# Patient Record
Sex: Female | Born: 1935 | Race: White | Hispanic: No | Marital: Single | Smoking: Never smoker
Health system: Southern US, Community
[De-identification: ages and names within clinical notes are randomized; demographics above are authoritative.]

## PROBLEM LIST (undated history)

## (undated) DIAGNOSIS — I471 Supraventricular tachycardia, unspecified: Secondary | ICD-10-CM

## (undated) DIAGNOSIS — I10 Essential (primary) hypertension: Secondary | ICD-10-CM

## (undated) DIAGNOSIS — E785 Hyperlipidemia, unspecified: Secondary | ICD-10-CM

## (undated) DIAGNOSIS — D649 Anemia, unspecified: Secondary | ICD-10-CM

## (undated) DIAGNOSIS — M1990 Unspecified osteoarthritis, unspecified site: Secondary | ICD-10-CM

## (undated) DIAGNOSIS — K449 Diaphragmatic hernia without obstruction or gangrene: Secondary | ICD-10-CM

## (undated) DIAGNOSIS — R609 Edema, unspecified: Secondary | ICD-10-CM

## (undated) DIAGNOSIS — K219 Gastro-esophageal reflux disease without esophagitis: Secondary | ICD-10-CM

## (undated) DIAGNOSIS — E039 Hypothyroidism, unspecified: Secondary | ICD-10-CM

## (undated) DIAGNOSIS — K589 Irritable bowel syndrome without diarrhea: Secondary | ICD-10-CM

## (undated) DIAGNOSIS — Z9289 Personal history of other medical treatment: Secondary | ICD-10-CM

## (undated) DIAGNOSIS — I499 Cardiac arrhythmia, unspecified: Secondary | ICD-10-CM

## (undated) DIAGNOSIS — I2510 Atherosclerotic heart disease of native coronary artery without angina pectoris: Secondary | ICD-10-CM

## (undated) HISTORY — DX: Hyperlipidemia, unspecified: E78.5

## (undated) HISTORY — DX: Unspecified osteoarthritis, unspecified site: M19.90

## (undated) HISTORY — DX: Atherosclerotic heart disease of native coronary artery without angina pectoris: I25.10

## (undated) HISTORY — DX: Supraventricular tachycardia, unspecified: I47.10

## (undated) HISTORY — DX: Gastro-esophageal reflux disease without esophagitis: K21.9

## (undated) HISTORY — PX: BREAST SURGERY: SHX581

## (undated) HISTORY — DX: Diaphragmatic hernia without obstruction or gangrene: K44.9

## (undated) HISTORY — PX: CARDIAC CATHETERIZATION: SHX172

## (undated) HISTORY — DX: Irritable bowel syndrome, unspecified: K58.9

## (undated) HISTORY — PX: APPENDECTOMY: SHX54

## (undated) HISTORY — DX: Hypothyroidism, unspecified: E03.9

## (undated) HISTORY — DX: Supraventricular tachycardia: I47.1

## (undated) HISTORY — DX: Essential (primary) hypertension: I10

## (undated) HISTORY — DX: Edema, unspecified: R60.9

## (undated) HISTORY — PX: EYE SURGERY: SHX253

---

## 1997-08-14 ENCOUNTER — Ambulatory Visit (HOSPITAL_COMMUNITY): Admission: RE | Admit: 1997-08-14 | Discharge: 1997-08-14 | Payer: Self-pay | Admitting: Family Medicine

## 1998-02-27 DIAGNOSIS — I2510 Atherosclerotic heart disease of native coronary artery without angina pectoris: Secondary | ICD-10-CM

## 1998-02-27 HISTORY — PX: CORONARY ARTERY BYPASS GRAFT: SHX141

## 1998-02-27 HISTORY — DX: Atherosclerotic heart disease of native coronary artery without angina pectoris: I25.10

## 1998-06-17 ENCOUNTER — Inpatient Hospital Stay (HOSPITAL_COMMUNITY): Admission: AD | Admit: 1998-06-17 | Discharge: 1998-06-27 | Payer: Self-pay | Admitting: Internal Medicine

## 1998-06-20 ENCOUNTER — Encounter: Payer: Self-pay | Admitting: *Deleted

## 1998-06-24 ENCOUNTER — Encounter: Payer: Self-pay | Admitting: Thoracic Surgery (Cardiothoracic Vascular Surgery)

## 1998-12-20 ENCOUNTER — Other Ambulatory Visit: Admission: RE | Admit: 1998-12-20 | Discharge: 1998-12-20 | Payer: Self-pay | Admitting: Family Medicine

## 1999-01-25 ENCOUNTER — Encounter: Payer: Self-pay | Admitting: Family Medicine

## 1999-01-25 ENCOUNTER — Ambulatory Visit (HOSPITAL_COMMUNITY): Admission: RE | Admit: 1999-01-25 | Discharge: 1999-01-25 | Payer: Self-pay | Admitting: Family Medicine

## 1999-01-31 ENCOUNTER — Encounter: Payer: Self-pay | Admitting: Family Medicine

## 1999-01-31 ENCOUNTER — Ambulatory Visit (HOSPITAL_COMMUNITY): Admission: RE | Admit: 1999-01-31 | Discharge: 1999-01-31 | Payer: Self-pay | Admitting: Family Medicine

## 1999-11-03 ENCOUNTER — Other Ambulatory Visit: Admission: RE | Admit: 1999-11-03 | Discharge: 1999-11-03 | Payer: Self-pay | Admitting: *Deleted

## 2000-12-10 ENCOUNTER — Encounter: Payer: Self-pay | Admitting: Emergency Medicine

## 2000-12-10 ENCOUNTER — Inpatient Hospital Stay (HOSPITAL_COMMUNITY): Admission: EM | Admit: 2000-12-10 | Discharge: 2000-12-12 | Payer: Self-pay | Admitting: Emergency Medicine

## 2000-12-12 ENCOUNTER — Encounter: Payer: Self-pay | Admitting: Internal Medicine

## 2001-03-20 ENCOUNTER — Emergency Department (HOSPITAL_COMMUNITY): Admission: EM | Admit: 2001-03-20 | Discharge: 2001-03-20 | Payer: Self-pay | Admitting: Emergency Medicine

## 2001-03-20 ENCOUNTER — Encounter: Payer: Self-pay | Admitting: Emergency Medicine

## 2001-06-27 ENCOUNTER — Emergency Department (HOSPITAL_COMMUNITY): Admission: EM | Admit: 2001-06-27 | Discharge: 2001-06-27 | Payer: Self-pay | Admitting: Emergency Medicine

## 2001-06-27 ENCOUNTER — Encounter: Payer: Self-pay | Admitting: Emergency Medicine

## 2001-07-03 ENCOUNTER — Encounter: Admission: RE | Admit: 2001-07-03 | Discharge: 2001-07-03 | Payer: Self-pay | Admitting: Internal Medicine

## 2001-07-03 ENCOUNTER — Encounter (HOSPITAL_BASED_OUTPATIENT_CLINIC_OR_DEPARTMENT_OTHER): Payer: Self-pay | Admitting: Internal Medicine

## 2003-02-28 HISTORY — PX: CHOLECYSTECTOMY: SHX55

## 2003-05-01 ENCOUNTER — Inpatient Hospital Stay (HOSPITAL_COMMUNITY): Admission: EM | Admit: 2003-05-01 | Discharge: 2003-05-02 | Payer: Self-pay | Admitting: Emergency Medicine

## 2003-07-28 ENCOUNTER — Ambulatory Visit (HOSPITAL_COMMUNITY): Admission: RE | Admit: 2003-07-28 | Discharge: 2003-07-29 | Payer: Self-pay | Admitting: General Surgery

## 2003-07-28 ENCOUNTER — Encounter (INDEPENDENT_AMBULATORY_CARE_PROVIDER_SITE_OTHER): Payer: Self-pay | Admitting: Specialist

## 2003-09-27 ENCOUNTER — Emergency Department (HOSPITAL_COMMUNITY): Admission: EM | Admit: 2003-09-27 | Discharge: 2003-09-27 | Payer: Self-pay | Admitting: Emergency Medicine

## 2003-09-29 ENCOUNTER — Encounter: Admission: RE | Admit: 2003-09-29 | Discharge: 2003-09-29 | Payer: Self-pay | Admitting: General Surgery

## 2004-02-26 ENCOUNTER — Emergency Department (HOSPITAL_COMMUNITY): Admission: EM | Admit: 2004-02-26 | Discharge: 2004-02-26 | Payer: Self-pay

## 2004-03-07 ENCOUNTER — Ambulatory Visit: Payer: Self-pay | Admitting: Internal Medicine

## 2004-05-13 ENCOUNTER — Emergency Department (HOSPITAL_COMMUNITY): Admission: EM | Admit: 2004-05-13 | Discharge: 2004-05-13 | Payer: Self-pay | Admitting: Emergency Medicine

## 2004-06-14 ENCOUNTER — Ambulatory Visit: Payer: Self-pay | Admitting: Cardiology

## 2004-06-15 ENCOUNTER — Inpatient Hospital Stay (HOSPITAL_COMMUNITY): Admission: EM | Admit: 2004-06-15 | Discharge: 2004-06-16 | Payer: Self-pay | Admitting: Emergency Medicine

## 2004-06-30 ENCOUNTER — Ambulatory Visit: Payer: Self-pay | Admitting: Internal Medicine

## 2004-09-22 ENCOUNTER — Ambulatory Visit: Payer: Self-pay | Admitting: Cardiovascular Disease

## 2004-09-23 ENCOUNTER — Inpatient Hospital Stay (HOSPITAL_COMMUNITY): Admission: EM | Admit: 2004-09-23 | Discharge: 2004-09-24 | Payer: Self-pay | Admitting: Emergency Medicine

## 2004-09-30 ENCOUNTER — Ambulatory Visit: Payer: Self-pay

## 2004-10-05 ENCOUNTER — Ambulatory Visit: Payer: Self-pay | Admitting: Cardiology

## 2004-10-27 ENCOUNTER — Ambulatory Visit: Payer: Self-pay | Admitting: Gastroenterology

## 2004-11-01 ENCOUNTER — Ambulatory Visit: Payer: Self-pay | Admitting: Gastroenterology

## 2004-11-03 ENCOUNTER — Ambulatory Visit: Payer: Self-pay | Admitting: Internal Medicine

## 2004-11-10 ENCOUNTER — Ambulatory Visit: Payer: Self-pay | Admitting: Gastroenterology

## 2004-12-30 ENCOUNTER — Ambulatory Visit: Payer: Self-pay | Admitting: Gastroenterology

## 2005-05-05 ENCOUNTER — Ambulatory Visit: Payer: Self-pay | Admitting: Internal Medicine

## 2005-06-28 ENCOUNTER — Encounter: Payer: Self-pay | Admitting: Internal Medicine

## 2005-11-02 ENCOUNTER — Ambulatory Visit: Payer: Self-pay | Admitting: Internal Medicine

## 2006-06-14 ENCOUNTER — Ambulatory Visit: Payer: Self-pay | Admitting: Internal Medicine

## 2006-07-30 ENCOUNTER — Ambulatory Visit: Payer: Self-pay | Admitting: Internal Medicine

## 2006-12-13 ENCOUNTER — Ambulatory Visit: Payer: Self-pay | Admitting: Internal Medicine

## 2007-02-28 HISTORY — PX: REPLACEMENT TOTAL KNEE: SUR1224

## 2007-03-13 ENCOUNTER — Ambulatory Visit: Payer: Self-pay | Admitting: Cardiology

## 2007-03-13 ENCOUNTER — Inpatient Hospital Stay (HOSPITAL_COMMUNITY): Admission: RE | Admit: 2007-03-13 | Discharge: 2007-03-22 | Payer: Self-pay | Admitting: Orthopedic Surgery

## 2007-03-14 ENCOUNTER — Encounter (INDEPENDENT_AMBULATORY_CARE_PROVIDER_SITE_OTHER): Payer: Self-pay | Admitting: Orthopedic Surgery

## 2007-03-31 ENCOUNTER — Ambulatory Visit: Payer: Self-pay | Admitting: Cardiology

## 2007-03-31 ENCOUNTER — Inpatient Hospital Stay (HOSPITAL_COMMUNITY): Admission: EM | Admit: 2007-03-31 | Discharge: 2007-04-04 | Payer: Self-pay | Admitting: Emergency Medicine

## 2007-04-01 ENCOUNTER — Encounter: Payer: Self-pay | Admitting: Cardiology

## 2007-05-10 ENCOUNTER — Ambulatory Visit: Payer: Self-pay | Admitting: Internal Medicine

## 2007-08-05 ENCOUNTER — Ambulatory Visit: Payer: Self-pay | Admitting: Internal Medicine

## 2007-08-05 LAB — CONVERTED CEMR LAB
AST: 22 units/L (ref 0–37)
Alkaline Phosphatase: 69 units/L (ref 39–117)
Bilirubin, Direct: 0.2 mg/dL (ref 0.0–0.3)
Total Bilirubin: 1.1 mg/dL (ref 0.3–1.2)
Total CHOL/HDL Ratio: 2.7

## 2007-08-12 ENCOUNTER — Ambulatory Visit: Payer: Self-pay | Admitting: Internal Medicine

## 2007-09-05 ENCOUNTER — Emergency Department (HOSPITAL_COMMUNITY): Admission: EM | Admit: 2007-09-05 | Discharge: 2007-09-06 | Payer: Self-pay | Admitting: Emergency Medicine

## 2007-09-12 ENCOUNTER — Ambulatory Visit: Payer: Self-pay | Admitting: Internal Medicine

## 2007-09-13 ENCOUNTER — Ambulatory Visit: Payer: Self-pay | Admitting: Internal Medicine

## 2007-09-13 ENCOUNTER — Ambulatory Visit: Payer: Self-pay

## 2007-09-13 LAB — CONVERTED CEMR LAB: TSH: 0.11 microintl units/mL — ABNORMAL LOW (ref 0.35–5.50)

## 2007-09-20 ENCOUNTER — Ambulatory Visit: Payer: Self-pay

## 2007-09-20 ENCOUNTER — Encounter: Payer: Self-pay | Admitting: Internal Medicine

## 2007-10-16 ENCOUNTER — Ambulatory Visit: Payer: Self-pay | Admitting: Gastroenterology

## 2007-10-16 DIAGNOSIS — K219 Gastro-esophageal reflux disease without esophagitis: Secondary | ICD-10-CM

## 2007-11-11 ENCOUNTER — Ambulatory Visit: Payer: Self-pay | Admitting: Internal Medicine

## 2007-11-11 LAB — CONVERTED CEMR LAB
CO2: 32 meq/L (ref 19–32)
Chloride: 99 meq/L (ref 96–112)
Creatinine, Ser: 0.8 mg/dL (ref 0.4–1.2)
Pro B Natriuretic peptide (BNP): 81 pg/mL (ref 0.0–100.0)

## 2008-07-09 ENCOUNTER — Encounter: Payer: Self-pay | Admitting: Cardiovascular Disease

## 2008-07-09 ENCOUNTER — Encounter: Payer: Self-pay | Admitting: Internal Medicine

## 2008-08-28 ENCOUNTER — Ambulatory Visit: Payer: Self-pay | Admitting: Internal Medicine

## 2008-08-28 DIAGNOSIS — Z951 Presence of aortocoronary bypass graft: Secondary | ICD-10-CM

## 2008-08-28 DIAGNOSIS — I10 Essential (primary) hypertension: Secondary | ICD-10-CM

## 2008-08-28 DIAGNOSIS — E782 Mixed hyperlipidemia: Secondary | ICD-10-CM

## 2008-10-09 IMAGING — CR DG CHEST 1V PORT
1 series · 1 of 1 positions shown · non-contrast
Comparison: 03/31/2007

CLINICAL DATA: ER patient with chest pain.

PORTABLE CHEST - 1 VIEW

[AP]
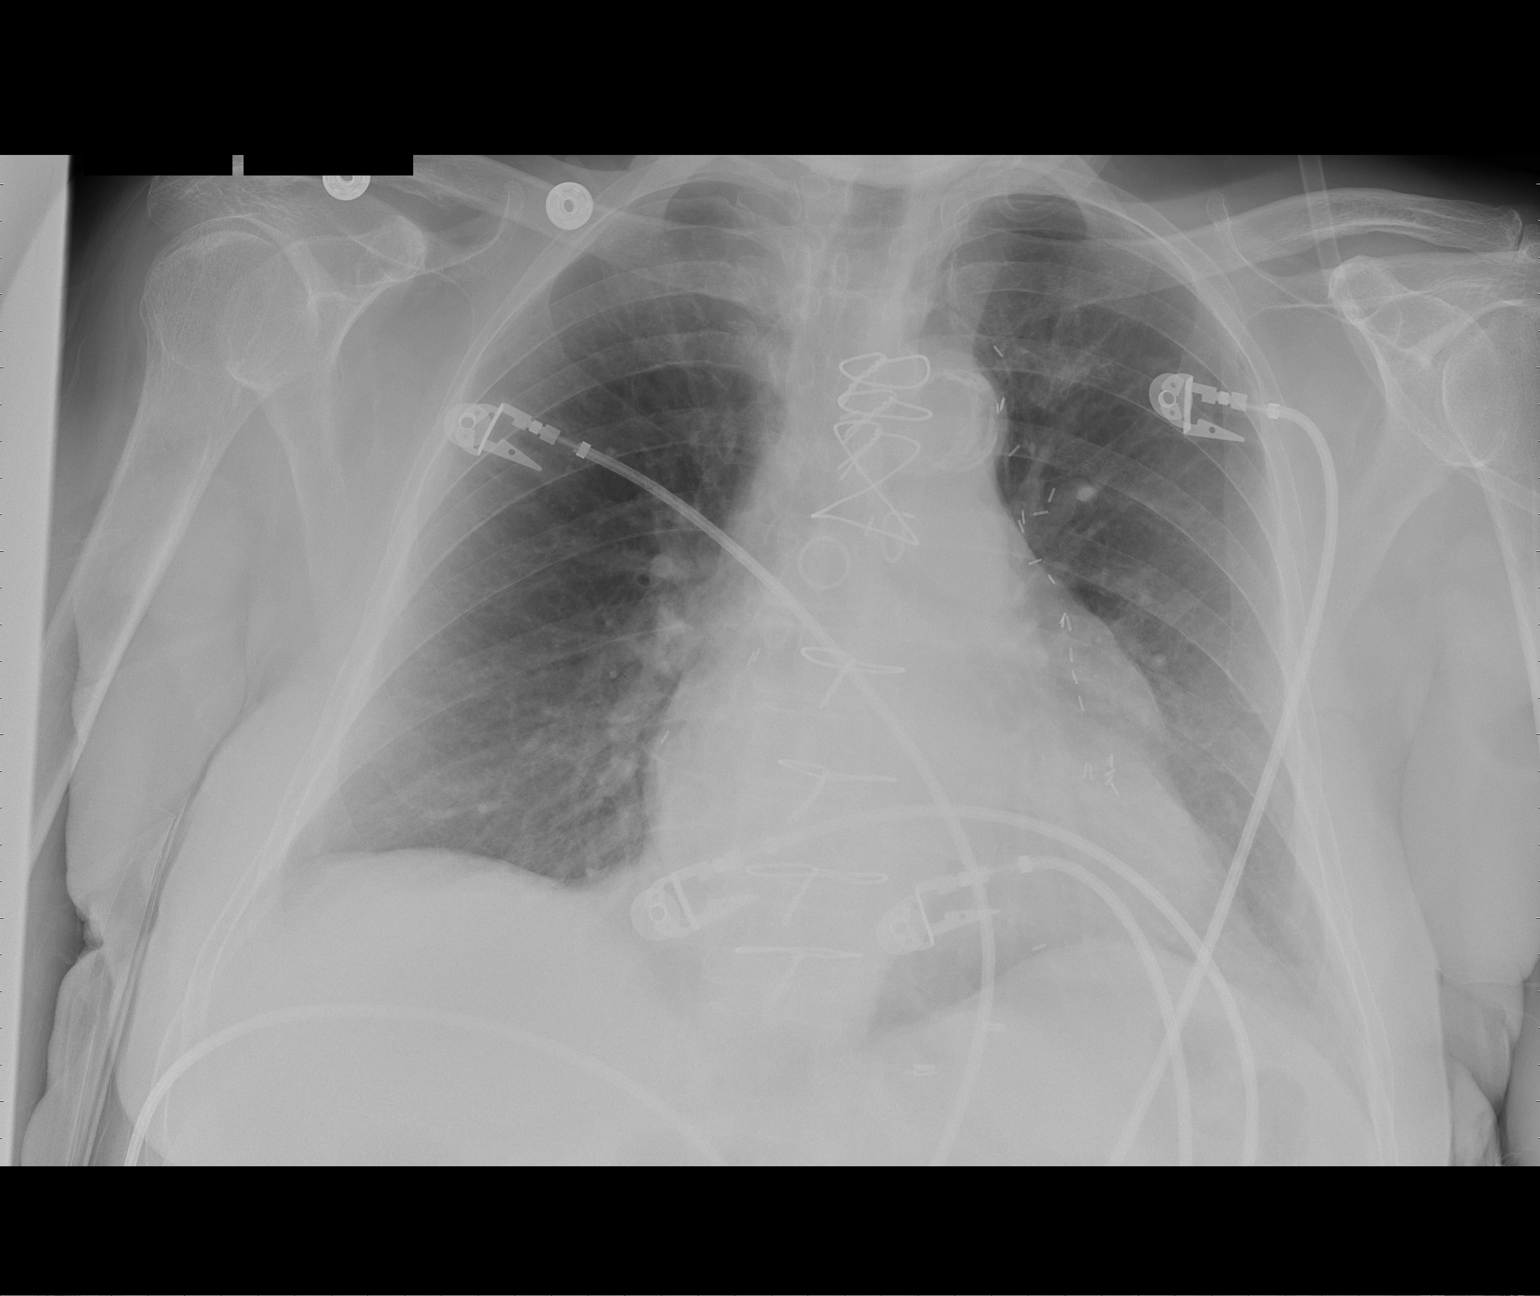

[1 of 1 positions shown; findings below may reference images not displayed]

FINDINGS: Cardiomegaly and pulmonary vascular congestion.  Minimal
perivascular edema is likely present.  Sternal wire sutures and
mediastinal clips.
IMPRESSION: Findings consistent with mild CHF.

## 2008-11-18 ENCOUNTER — Ambulatory Visit: Payer: Self-pay | Admitting: Cardiology

## 2008-11-18 ENCOUNTER — Inpatient Hospital Stay (HOSPITAL_COMMUNITY): Admission: EM | Admit: 2008-11-18 | Discharge: 2008-11-19 | Payer: Self-pay | Admitting: Emergency Medicine

## 2008-11-19 ENCOUNTER — Telehealth (INDEPENDENT_AMBULATORY_CARE_PROVIDER_SITE_OTHER): Payer: Self-pay | Admitting: *Deleted

## 2008-11-26 ENCOUNTER — Telehealth (INDEPENDENT_AMBULATORY_CARE_PROVIDER_SITE_OTHER): Payer: Self-pay | Admitting: *Deleted

## 2008-11-30 ENCOUNTER — Ambulatory Visit: Payer: Self-pay | Admitting: Cardiovascular Disease

## 2008-11-30 ENCOUNTER — Encounter: Payer: Self-pay | Admitting: Internal Medicine

## 2008-11-30 ENCOUNTER — Encounter (HOSPITAL_COMMUNITY): Admission: RE | Admit: 2008-11-30 | Discharge: 2009-01-29 | Payer: Self-pay | Admitting: Cardiovascular Disease

## 2008-11-30 ENCOUNTER — Ambulatory Visit: Payer: Self-pay

## 2008-12-11 ENCOUNTER — Ambulatory Visit: Payer: Self-pay | Admitting: Internal Medicine

## 2008-12-16 ENCOUNTER — Ambulatory Visit: Payer: Self-pay | Admitting: Internal Medicine

## 2008-12-16 DIAGNOSIS — E7800 Pure hypercholesterolemia, unspecified: Secondary | ICD-10-CM

## 2008-12-17 LAB — CONVERTED CEMR LAB
CO2: 29 meq/L (ref 19–32)
Calcium: 9.3 mg/dL (ref 8.4–10.5)
Glucose, Bld: 114 mg/dL — ABNORMAL HIGH (ref 70–99)
Potassium: 4.2 meq/L (ref 3.5–5.1)
Sodium: 134 meq/L — ABNORMAL LOW (ref 135–145)

## 2008-12-25 ENCOUNTER — Telehealth: Payer: Self-pay | Admitting: Internal Medicine

## 2008-12-31 ENCOUNTER — Ambulatory Visit: Payer: Self-pay | Admitting: Internal Medicine

## 2008-12-31 DIAGNOSIS — E876 Hypokalemia: Secondary | ICD-10-CM

## 2009-01-08 LAB — CONVERTED CEMR LAB
BUN: 4 mg/dL — ABNORMAL LOW (ref 6–23)
CO2: 30 meq/L (ref 19–32)
Chloride: 96 meq/L (ref 96–112)
Creatinine, Ser: 0.7 mg/dL (ref 0.4–1.2)

## 2009-06-22 ENCOUNTER — Encounter (INDEPENDENT_AMBULATORY_CARE_PROVIDER_SITE_OTHER): Payer: Self-pay | Admitting: *Deleted

## 2009-06-22 ENCOUNTER — Telehealth: Payer: Self-pay | Admitting: Gastroenterology

## 2009-06-24 ENCOUNTER — Ambulatory Visit: Payer: Self-pay | Admitting: Internal Medicine

## 2009-06-24 DIAGNOSIS — R05 Cough: Secondary | ICD-10-CM

## 2009-06-25 ENCOUNTER — Encounter: Payer: Self-pay | Admitting: Internal Medicine

## 2009-06-30 ENCOUNTER — Ambulatory Visit: Payer: Self-pay | Admitting: Internal Medicine

## 2009-07-02 LAB — CONVERTED CEMR LAB
Chloride: 102 meq/L (ref 96–112)
Creatinine, Ser: 0.7 mg/dL (ref 0.4–1.2)
GFR calc non Af Amer: 86.82 mL/min (ref >60)
Potassium: 4.1 meq/L (ref 3.5–5.1)

## 2009-07-27 ENCOUNTER — Encounter (INDEPENDENT_AMBULATORY_CARE_PROVIDER_SITE_OTHER): Payer: Self-pay | Admitting: *Deleted

## 2009-07-30 ENCOUNTER — Ambulatory Visit: Payer: Self-pay | Admitting: Gastroenterology

## 2009-08-03 ENCOUNTER — Ambulatory Visit: Payer: Self-pay | Admitting: Gastroenterology

## 2009-08-04 ENCOUNTER — Encounter: Payer: Self-pay | Admitting: Gastroenterology

## 2010-01-12 ENCOUNTER — Encounter: Payer: Self-pay | Admitting: Internal Medicine

## 2010-01-17 ENCOUNTER — Encounter: Payer: Self-pay | Admitting: Internal Medicine

## 2010-01-25 ENCOUNTER — Telehealth: Payer: Self-pay | Admitting: Internal Medicine

## 2010-02-25 ENCOUNTER — Ambulatory Visit: Payer: Self-pay | Admitting: Internal Medicine

## 2010-02-25 ENCOUNTER — Encounter: Payer: Self-pay | Admitting: Internal Medicine

## 2010-03-27 LAB — CONVERTED CEMR LAB
Basophils Absolute: 0.1 10*3/uL (ref 0.0–0.1)
CO2: 31 meq/L (ref 19–32)
Calcium: 9.2 mg/dL (ref 8.4–10.5)
Chloride: 87 meq/L — ABNORMAL LOW (ref 96–112)
Creatinine, Ser: 0.6 mg/dL (ref 0.4–1.2)
GFR calc non Af Amer: 74.42 mL/min (ref >60)
HCT: 40.8 % (ref 36.0–46.0)
Lymphs Abs: 2.3 10*3/uL (ref 0.7–4.0)
MCHC: 34.4 g/dL (ref 30.0–36.0)
MCV: 95.8 fL (ref 78.0–100.0)
Monocytes Absolute: 1.2 10*3/uL — ABNORMAL HIGH (ref 0.1–1.0)
Platelets: 279 10*3/uL (ref 150.0–400.0)
RDW: 13.4 % (ref 11.5–14.6)
Sodium: 130 meq/L — ABNORMAL LOW (ref 135–145)

## 2010-03-31 NOTE — Assessment & Plan Note (Signed)
Summary: rov/sl  Medications Added TYLENOL EXTRA STRENGTH 500 MG TABS (ACETAMINOPHEN) as needed TUMS 500 MG CHEW (CALCIUM CARBONATE ANTACID) as needed LIPITOR 80 MG TABS (ATORVASTATIN CALCIUM) take 1 tab every evening        Visit Type:  Follow-up Primary [name]:  Jarome Matin MD  CC:  No cardiac complaints.  History of Present Illness: patient is a 77 yer old with CAD. I last saw her in the spring.  She was admitted in the fall 2010 with CP.  Outpt myoview was normal. since I saw her she has done well from a cardiac standpoint.  No chest pains.  No SOB. She did have some problems with pain in her right leg, improving.  NOw only in the ankle  Current Medications (verified): 1)  Levothroid 75 Mcg Tabs (Levothyroxine Sodium) .Marland Kitchen.. 1 Tab Once Daily 2)  Amlodipine Besylate 10 Mg Tabs (Amlodipine Besylate) .... Take One Tablet By Mouth Daily 3)  Aspirin 81 Mg  Tabs (Aspirin) .... One Tablet By Mouth 1 Tab Qday 4)  Lisinopril 5 Mg  Tabs (Lisinopril) .... One Tablet By Mouth Once Daily 5)  Lipitor 80 Mg  Tabs (Atorvastatin Calcium) .... One Tablet By Mouth Once Daily-- 6)  Tylenol Extra Strength 500 Mg Tabs (Acetaminophen) .... As Needed 7)  Furosemide 20 Mg  Tabs (Furosemide) .Marland Kitchen.. 1 Once Daily 8)  Prilosec 20 Mg Cpdr (Omeprazole) .... Daily 9)  Tums 500 Mg Chew (Calcium Carbonate Antacid) .... As Needed 10)  Vitamin D .... 50,000 Units 1 Tab Q Weekly 11)  Klor-Con M20 20 Meq Cr-Tabs (Potassium Chloride Crys Cr) .Marland Kitchen.. 1 Tablet Every Day 12)  Nitrostat 0.4 Mg Subl (Nitroglycerin) .Marland Kitchen.. 1 Tablet Under Tongue At Onset of Chest Pain; You May Repeat Every 5 Minutes For Up To 3 Doses. 13)  Metoprolol Tartrate 50 Mg Tabs (Metoprolol Tartrate) .Marland Kitchen.. 1 Tab 2 Times Per Day  Allergies: 1)  ! Codeine  Past History:  Past medical, surgical, family and social histories (including risk factors) reviewed, and no changes noted (except as noted below).  Past Medical History: Reviewed history from  05/14/2008 and no changes required. Coronary Artery Disease Hypertension Hyperlipidemia SVT with ablation DJD Diabetes GERD Irritable Bowel Syndrome Hypothyroidism Hiatal Hernia Edema  Past Surgical History: Reviewed history from 10/16/2007 and no changes required. CABG-2000 L knee Replacement 2009 Cholecystectomy-2005 cardiac ablation Appendectomy  Family History: Reviewed history from 05/14/2008 and no changes required. Family History of Breast Cancer:sister No FH of Colon Cancer: Family History of Diabetes: sister brother Family History of Heart Disease: sister, father, mother  Mother died at age 24 possibly of an MI.  Father died   at 20 of an MI.  She has a brother who had a history of CAD and CABG,   and another brother who has a history of CAD and PCI, and a sister with   history of CAD and PCI.  Social History: Reviewed history from 05/14/2008 and no changes required. Occupation: retired Patient has never smoked.  Alcohol Use - no Illicit Drug Use - no Patient gets regular exercise. Single   Review of Systems       All systems reviewed. Neg to the above problem  Vital Signs:  Patient profile:   77 year old female Height:      64 inches Weight:      162.50 pounds BMI:     27.99 Pulse rate:   64 / minute Pulse rhythm:   regular Resp:  18 per minute BP sitting:   130 / 68  (left arm) Cuff size:   large  Vitals Entered By: Vikki Ports (February 25, 2010 9:12 AM)  Physical Exam  Additional Exam:  Patient is in NAD HEENT:  Normocephalic, atraumatic. EOMI, PERRLA.  Neck: JVP is normal. No thyromegaly. No bruits.  Lungs: clear to auscultation. No rales no wheezes.  Heart: Regular rate and rhythm. Normal S1, S2. No S3.   No significant murmurs. PMI not displaced.  Abdomen:  Supple, nontender. Normal bowel sounds. No masses. No hepatomegaly.  Extremities:   Good distal pulses throughout. No lower extremity edema.  Musculoskeletal :moving all  extremities.  Neuro:   alert and oriented x3.    EKG  Procedure date:  02/25/2010  Findings:      NSR.  First degree AV block.  Nonspecific ST T wave changes.  Impression & Recommendations:  Problem # 1:  CAD (ICD-414.00)  Her updated medication list for this problem includes:    Amlodipine Besylate 10 Mg Tabs (Amlodipine besylate) .Marland Kitchen... Take one tablet by mouth daily    Aspirin 81 Mg Tabs (Aspirin) ..... One tablet by mouth 1 tab qday    Lisinopril 5 Mg Tabs (Lisinopril) ..... One tablet by mouth once daily    Nitrostat 0.4 Mg Subl (Nitroglycerin) .Marland Kitchen... 1 tablet under tongue at onset of chest pain; you may repeat every 5 minutes for up to 3 doses.    Metoprolol Tartrate 50 Mg Tabs (Metoprolol tartrate) .Marland Kitchen... 1 tab 2 times per day  Her updated medication list for this problem includes:    Amlodipine Besylate 10 Mg Tabs (Amlodipine besylate) .Marland Kitchen... Take one tablet by mouth daily    Aspirin 81 Mg Tabs (Aspirin) ..... One tablet by mouth 1 tab qday    Lisinopril 5 Mg Tabs (Lisinopril) ..... One tablet by mouth once daily    Nitrostat 0.4 Mg Subl (Nitroglycerin) .Marland Kitchen... 1 tablet under tongue at onset of chest pain; you may repeat every 5 minutes for up to 3 doses.    Metoprolol Tartrate 50 Mg Tabs (Metoprolol tartrate) .Marland Kitchen... 1 tab 2 times per day  Problem # 2:  HYPERLIPIDEMIA-MIXED (ICD-272.4)  Will get fasting labs from Dr. Norval Gable office The following medications were removed from the medication list:    Lipitor 80 Mg Tabs (Atorvastatin calcium) ..... One tablet by mouth once daily--  The following medications were removed from the medication list:    Lipitor 80 Mg Tabs (Atorvastatin calcium) ..... One tablet by mouth once daily-- Her updated medication list for this problem includes:    Lipitor 80 Mg Tabs (Atorvastatin calcium) .Marland Kitchen... Take 1 tab every evening  Problem # 3:  HYPERTENSION, BENIGN (ICD-401.1)  Good control Her updated medication list for this problem  includes:    Amlodipine Besylate 10 Mg Tabs (Amlodipine besylate) .Marland Kitchen... Take one tablet by mouth daily    Aspirin 81 Mg Tabs (Aspirin) ..... One tablet by mouth 1 tab qday    Lisinopril 5 Mg Tabs (Lisinopril) ..... One tablet by mouth once daily    Furosemide 20 Mg Tabs (Furosemide) .Marland Kitchen... 1 once daily    Metoprolol Tartrate 50 Mg Tabs (Metoprolol tartrate) .Marland Kitchen... 1 tab 2 times per day  Her updated medication list for this problem includes:    Amlodipine Besylate 10 Mg Tabs (Amlodipine besylate) .Marland Kitchen... Take one tablet by mouth daily    Aspirin 81 Mg Tabs (Aspirin) ..... One tablet by mouth 1 tab qday    Lisinopril 5  Mg Tabs (Lisinopril) ..... One tablet by mouth once daily    Furosemide 20 Mg Tabs (Furosemide) .Marland Kitchen... 1 once daily    Metoprolol Tartrate 50 Mg Tabs (Metoprolol tartrate) .Marland Kitchen... 1 tab 2 times per day  Patient Instructions: 1)  Your physician recommends that you schedule a follow-up appointment in the fall with Dr. Tenny Craw 2)  Your physician recommends that you continue on your current medications as directed. Please refer to the Current Medication list given to you today. Prescriptions: LIPITOR 80 MG TABS (ATORVASTATIN CALCIUM) take 1 tab every evening  #90 x 3   Entered by:   Ledon Snare, RN   Authorized by:   Sherrill Raring, MD, [facility]   Signed by:   Ledon Snare, RN on 02/25/2010   Method used:   [facility] [phone][facility] (retail)       [address]       [place], [place]  [address]       Ph: [phone] or [phone]       Fax: [phone]   RxID:   [id number] KLOR-CON M20 20 MEQ CR-TABS (POTASSIUM CHLORIDE CRYS CR) 1 tablet every day  #90 x 3   Entered by:   Ledon Snare, RN   Authorized by:   Sherrill Raring, MD, [facility]   Signed by:   Ledon Snare, RN on 02/25/2010   Method used:   [facility] to        [facility] [phone]* (retail)       [address]       [place], [place]  [address]       Ph: [phone] or [phone]        Fax: [phone]   RxID:   [id number]

## 2010-03-31 NOTE — Letter (Signed)
Summary: [facility] Instructions  [facility]  [address] [place], [place] [address]   Phone: [phone]  Fax: [phone]       [name]    Apr 09, 1947    MRN: [id number]        Procedure Day /Date:  Tuesday 6/7     Arrival Time: 9:30 am      Procedure Time: 10:30 am     Location of Procedure:                    _x _  [facility] (4th Floor)                        PREPARATION FOR COLONOSCOPY WITH MOVIPREP   Starting 5 days prior to your procedure Thursday 6/2 do not eat nuts, seeds, popcorn, corn, beans, peas,  salads, or any raw vegetables.  Do not take any fiber supplements (e.g. Metamucil, Citrucel, and Benefiber).  THE DAY BEFORE YOUR PROCEDURE         DATE: Monday 6/6  1.  Drink clear liquids the entire day-NO SOLID FOOD  2.  Do not drink anything colored red or purple.  Avoid juices with pulp.  No orange juice.  3.  Drink at least 64 oz. (8 glasses) of fluid/clear liquids during the day to prevent dehydration and help the prep work efficiently.  CLEAR LIQUIDS INCLUDE: Water Jello Ice Popsicles Tea (sugar ok, no milk/cream) Powdered fruit flavored drinks Coffee (sugar ok, no milk/cream) Gatorade Juice: apple, white grape, white cranberry  Lemonade Clear bullion, consomm, broth Carbonated beverages (any kind) Strained chicken noodle soup Hard Candy                             4.  In the morning, mix first dose of MoviPrep solution:    Empty 1 Pouch A and 1 Pouch B into the disposable container    Add lukewarm drinking water to the top line of the container. Mix to dissolve    Refrigerate (mixed solution should be used within 24 hrs)  5.  Begin drinking the prep at 5:00 p.m. The MoviPrep container is divided by 4 marks.   Every 15 minutes drink the solution down to the next mark (approximately 8 oz) until the full liter is complete.   6.  Follow completed prep with 16 oz of clear liquid of your choice (Nothing red or  purple).  Continue to drink clear liquids until bedtime.  7.  Before going to bed, mix second dose of MoviPrep solution:    Empty 1 Pouch A and 1 Pouch B into the disposable container    Add lukewarm drinking water to the top line of the container. Mix to dissolve    Refrigerate  THE DAY OF YOUR PROCEDURE      DATE: Tuesday 6/7  Beginning at 5:30 a.m. (5 hours before procedure):         1. Every 15 minutes, drink the solution down to the next mark (approx 8 oz) until the full liter is complete.  2. Follow completed prep with 16 oz. of clear liquid of your choice.    3. You may drink clear liquids until 8:30 am (2 HOURS BEFORE PROCEDURE).   MEDICATION INSTRUCTIONS  Unless otherwise instructed, you should take regular prescription medications with a small sip of water   as early as possible the morning of  your procedure.   Additional medication instructions:  Hold Furosemide morning of procedure only.         OTHER INSTRUCTIONS  You will need a responsible adult at least 75 years of age to accompany you and drive you home.   This person must remain in the waiting room during your procedure.  Wear loose fitting clothing that is easily removed.  Leave jewelry and other valuables at home.  However, you may wish to bring a book to read or  an iPod/MP3 player to listen to music as you wait for your procedure to start.  Remove all body piercing jewelry and leave at home.  Total time from sign-in until discharge is approximately 2-3 hours.  You should go home directly after your procedure and rest.  You can resume normal activities the  day after your procedure.  The day of your procedure you should not:   Drive   Make legal decisions   Operate machinery   Drink alcohol   Return to work  You will receive specific instructions about eating, activities and medications before you leave.    The above instructions have been reviewed and explained to me by  Sherren Kerns RN  July 30, 2009 9:32 AM     I fully understand and can verbalize these instructions _____________________________ Date _________

## 2010-03-31 NOTE — Letter (Signed)
Summary: Patient Notice- Polyp Results  [facility]  [address] [place], [place] [address]   Phone: [phone]  Fax: [phone]        August 04, 2009 MRN: [id number]    [facility] [address] [place], [place]  [address]    Dear [name],  I am pleased to inform you that the colon polyp(s) removed during your recent colonoscopy was (were) found to be benign (no cancer detected) upon pathologic examination.  I recommend you have a repeat colonoscopy examination in 5 years to look for recurrent polyps, as having colon polyps increases your risk for having recurrent polyps or even colon cancer in the future.  Should you develop new or worsening symptoms of abdominal pain, bowel habit changes or bleeding from the rectum or bowels, please schedule an evaluation with either your primary care physician or with me.  Continue treatment plan as outlined the day of your exam.  Please call us if you are having persistent problems or have questions about your condition that have not been fully answered at this time.  Sincerely,  Meryl Dare MD [facility]  This letter has been electronically signed by your physician.  Appended Document: Patient Notice- Polyp Results letter mailed.

## 2010-03-31 NOTE — Procedures (Signed)
Summary: Colonoscopy  Patient: Pyper Olexa Note: All result statuses are Final unless otherwise noted.  Tests: (1) Colonoscopy (COL)   COL Colonoscopy           DONE     [facility]     [address]     [place], [place]  [address]           COLONOSCOPY PROCEDURE REPORT           PATIENT:  Sadiyah, Kangas  MR#:  [id number]     BIRTHDATE:  08-Oct-1932, 76 yrs. old  GENDER:  female     ENDOSCOPIST:  Judie Petit T. Russella Dar, MD, [facility]     PROCEDURE DATE:  08/03/2009     PROCEDURE:  Colonoscopy with biopsy     ASA CLASS:  Class II     INDICATIONS:  1) Routine Risk Screening     MEDICATIONS:   Fentanyl 50 mcg IV, Versed 5 mg IV     DESCRIPTION OF PROCEDURE:   After the risks benefits and     alternatives of the procedure were thoroughly explained, informed     consent was obtained.  Digital rectal exam was performed and     revealed no abnormalities.   The LB [id number] endoscope     was introduced through the anus and advanced to the cecum, which     was identified by both the appendix and ileocecal valve, without     limitations.  The quality of the prep was excellent, using     MoviPrep.  The instrument was then slowly withdrawn as the colon     was fully examined.     <<PROCEDUREIMAGES>>     FINDINGS:  Three polyps were found in the transverse colon. They     were 3 mm in size. The polyps were removed using cold biopsy     forceps.  A normal appearing cecum, ileocecal valve, and     appendiceal orifice were identified. The ascending, hepatic     flexure, splenic flexure, descending, sigmoid colon, and rectum     appeared unremarkable.  Retroflexed views in the rectum revealed     internal hemorrhoids, small.  The time to cecum =  2.5  minutes.     The scope was then withdrawn (time =  10  min) from the patient     and the procedure completed.     COMPLICATIONS:  None           ENDOSCOPIC IMPRESSION:     1) 3 mm, three polyps in the transverse colon     2) Internal  hemorrhoids           RECOMMENDATIONS:     1) Await pathology results     2) If the polyps removed today are adenomatous (pre-cancerous),     you will need a repeat colonoscopy in 5 years. Otherwise you     should continue to follow colorectal cancer screening guidelines     for "routine risk" patients with colonoscopy in 10 years.     Venita Lick. Russella Dar, MD, [facility]           CC: Jarome Matin, MD           n.     Rosalie DoctorVenita Lick. Charlee Squibb at 08/03/2009 11:17 AM           [place], [id number]  Note: An exclamation mark (!) indicates a result that was not dispersed into the  flowsheet. Document Creation Date: 08/03/2009 11:18 AM _______________________________________________________________________  (1) Order result status: Final Collection or observation date-time: 08/03/2009 11:14 Requested date-time:  Receipt date-time:  Reported date-time:  Referring Physician:   Ordering Physician: Claudette Head [phone]) Specimen Source:  Source: Launa Grill Order Number: [id number] Lab site:   Appended Document: Colonoscopy     Procedures Next Due Date:    Colonoscopy: 07/2014

## 2010-03-31 NOTE — Miscellaneous (Signed)
Summary: recall colon/Previsit/rm  Clinical Lists Changes  Medications: Added new medication of MOVIPREP 100 GM  SOLR (PEG-KCL-NACL-NASULF-NA ASC-C) As per prep instructions. - Signed Rx of MOVIPREP 100 GM  SOLR (PEG-KCL-NACL-NASULF-NA ASC-C) As per prep instructions.;  #1 x 0;  Signed;  Entered by: Sherren Kerns RN;  Authorized by: Meryl Dare MD [facility];  Method used: [facility] [phone]*, [address], [place], [place]  [address], Ph: [phone] or [phone], Fax: [phone] Observations: Added new observation of ALLERGY REV: Done (07/30/2009 8:31)    Prescriptions: MOVIPREP 100 GM  SOLR (PEG-KCL-NACL-NASULF-NA ASC-C) As per prep instructions.  #1 x 0   Entered by:   Sherren Kerns RN   Authorized by:   Meryl Dare MD [facility]   Signed by:   Sherren Kerns RN on 07/30/2009   Method used:   [facility]        [facility] [phone]* (retail)       [address]       [place], [place]  [address]       Ph: [phone] or [phone]       Fax: [phone]   RxID:   [id number]

## 2010-03-31 NOTE — Progress Notes (Signed)
Summary: pt has medication question  Medications Added METOPROLOL TARTRATE 50 MG TABS (METOPROLOL TARTRATE) 1 tab 2 times per day       Phone Note Call from Patient Call back at Home Phone [phone]   Caller: Patient Reason for Call: Talk to Nurse, Talk to Doctor Summary of Call: pt has a medication question she thinks she has the wrong medication and wants to discuss this asap Initial call taken by: Omer Jack,  January 25, 2010 9:39 AM  Follow-up for Phone Call        Called patient back. She wants to stay on metoprolol tartrate 50mg   two times per day because that is what she has been taking for a long time. I advised her that [facility]. [facility] [address] and advised them to fill Metoprolol Tartrate 50mg  two times per day #180 with 3 refills.  Layne Benton, RN, BSN  January 25, 2010 10:29 AM     New/Updated Medications: METOPROLOL TARTRATE 50 MG TABS (METOPROLOL TARTRATE) 1 tab 2 times per day

## 2010-03-31 NOTE — Miscellaneous (Signed)
  Clinical Lists Changes  Medications: Removed medication of HYDROCHLOROTHIAZIDE 25 MG  TABS (HYDROCHLOROTHIAZIDE) one tablet by mouth once daily

## 2010-03-31 NOTE — Letter (Signed)
Summary: Previsit letter  [facility]  [address] [place], [place] [address]   Phone: [phone]  Fax: [phone]       06/22/2009 MRN: [id number]  [name] [address] [name], [place]  [address]  Dear [name],  Welcome to the Gastroenterology Division at [facility].    You are scheduled to see a nurse for your pre-procedure visit on 07/30/09 at 9:00am on the 3rd floor at [facility], [address]  We ask that you try to arrive at our office 15 minutes prior to your appointment time to allow for check-in.  Your nurse visit will consist of discussing your medical and surgical history, your immediate family medical history, and your medications.    Please bring a complete list of all your medications or, if you prefer, bring the medication bottles and we will list them.  We will need to be aware of both prescribed and over the counter drugs.  We will need to know exact dosage information as well.  If you are on blood thinners (Coumadin, Plavix, Aggrenox, Ticlid, etc.) please call our office today/prior to your appointment, as we need to consult with your physician about holding your medication.   Please be prepared to read and sign documents such as consent forms, a financial agreement, and acknowledgement forms.  If necessary, and with your consent, a friend or relative is welcome to sit-in on the nurse visit with you.  Please bring your insurance card so that we may make a copy of it.  If your insurance requires a referral to see a specialist, please bring your referral form from your primary care physician.  No co-pay is required for this nurse visit.     If you cannot keep your appointment, please call [phone] to cancel or reschedule prior to your appointment date.  This allows [place] the opportunity to schedule an appointment for another patient in need of care.    Thank you for choosing [facility] for your medical  needs.  We appreciate the opportunity to care for you.  Please visit [place] at our website  to learn more about our practice.                     Sincerely.                                                                                                                   The Gastroenterology Division

## 2010-03-31 NOTE — Progress Notes (Signed)
Summary: Schedule recall colon   Phone Note Outgoing Call Call back at [facility] Phone [phone]   Call placed by: Christie Nottingham CMA Duncan Dull),  June 22, 2009 2:49 PM Call placed to: Patient Summary of Call: Called pt to schedule recall colonoscopy. Pt scheduled for 08/03/09 and nurse visit on 07/30/09. Pt verbalized understanding.  Initial call taken by: Christie Nottingham CMA Duncan Dull),  June 22, 2009 2:53 PM

## 2010-03-31 NOTE — Assessment & Plan Note (Signed)
Summary: 6 month rov/sl  Medications Added LIPITOR 80 MG  TABS (ATORVASTATIN CALCIUM) one tablet by mouth once daily-- * TYLENOL 500MG   TABS (ACETAMINOPHEN) as needed NITROSTAT 0.4 MG SUBL (NITROGLYCERIN) 1 tablet under tongue at onset of chest pain; you may repeat every 5 minutes for up to 3 doses.      Allergies Added:   Visit Type:  Follow-up Primary Provider:  Jarome Matin MD  CC:  fatigue and sob.  History of Present Illness: patient is a 77 year old woman with coronary artery disease. I last saw her in clinic in the fall.Marland Kitchen She was admitted to [facility] prior to that with chest pain, shortness of breath. In September.An outpatient Myoview was done which was normal.   SInce seen, she says about 3 wks ago she caught a cold.  Had feverish feelings then, not now.  COmplains of a lot of coughing productive of clear sputum.  Still bothered by it.  Notes some wheezing.  No PND.  CHronic 2 pillow orthopnea.  Feels tired, sluggish.  Current Medications (verified): 1)  Levothroid 75 Mcg Tabs (Levothyroxine Sodium) .Marland Kitchen.. 1 Tab Once Daily 2)  Amlodipine Besylate 10 Mg Tabs (Amlodipine Besylate) .... Take One Tablet By Mouth Daily 3)  Aspirin 81 Mg  Tabs (Aspirin) .... One Tablet By Mouth 1 Tab Qday 4)  Metoprolol Succinate 50 Mg  Xr24h-Tab (Metoprolol Succinate) .... One Tablet By Mouth Two Times A Day 5)  Lisinopril 5 Mg  Tabs (Lisinopril) .... One Tablet By Mouth Once Daily 6)  Hydrochlorothiazide 25 Mg  Tabs (Hydrochlorothiazide) .... One Tablet By Mouth Once Daily 7)  Lipitor 80 Mg  Tabs (Atorvastatin Calcium) .... One Tablet By Mouth Once Daily-- 8)  Tylenol 500mg   Tabs (Acetaminophen) .... As Needed 9)  Furosemide 20 Mg  Tabs (Furosemide) .Marland Kitchen.. 1 Once Daily 10)  Levothroid 100 Mcg Tabs (Levothyroxine Sodium) .Marland Kitchen.. 1 Tab Once Daily 11)  Prilosec 20 Mg Cpdr (Omeprazole) .... Daily 12)  Tums .... As Needed 13)  Vitamin D .... 50,000 Units 1 Tab Q Weekly 14)  Klor-Con M20 20 Meq  Cr-Tabs (Potassium Chloride Crys Cr) .Marland Kitchen.. 1 Tablet Every Day 15)  Nitrostat 0.4 Mg Subl (Nitroglycerin) .Marland Kitchen.. 1 Tablet Under Tongue At Onset of Chest Pain; You May Repeat Every 5 Minutes For Up To 3 Doses.  Allergies (verified): 1)  ! Codeine  Past History:  Past medical, surgical, family and social histories (including risk factors) reviewed, and no changes noted (except as noted below).  Past Medical History: Reviewed history from 05/14/2008 and no changes required. Coronary Artery Disease Hypertension Hyperlipidemia SVT with ablation DJD Diabetes GERD Irritable Bowel Syndrome Hypothyroidism Hiatal Hernia Edema  Past Surgical History: Reviewed history from 10/16/2007 and no changes required. CABG-2000 L knee Replacement 2009 Cholecystectomy-2005 cardiac ablation Appendectomy  Family History: Reviewed history from 05/14/2008 and no changes required. Family History of Breast Cancer:sister No FH of Colon Cancer: Family History of Diabetes: sister brother Family History of Heart Disease: sister, father, mother  Mother died at age 35 possibly of an MI.  Father died   at 50 of an MI.  She has a brother who had a history of CAD and CABG,   and another brother who has a history of CAD and PCI, and a sister with   history of CAD and PCI.  Social History: Reviewed history from 05/14/2008 and no changes required. Occupation: retired Patient has never smoked.  Alcohol Use - no Illicit Drug Use - no Patient  gets regular exercise. Single   Review of Systems       All systmes reviewed.  Negatvie to the above  Vital Signs:  Patient profile:   77 year old female Height:      64 inches Weight:      157 pounds BMI:     27.05 Pulse rate:   70 / minute BP sitting:   125 / 75  (left arm) Cuff size:   regular  Vitals Entered By: Burnett Kanaris, CNA (June 24, 2009 11:38 AM)  Physical Exam  Additional Exam:  Patient is in NAD HEENT:  Normocephalic, atraumatic. EOMI,  PERRLA.  Neck: JVP is normal. No thyromegaly. No bruits.  Lungs:Rhonchi in L lower lobe.  Rare wheeze.  No rales. Heart: Regular rate and rhythm. Normal S1, S2. No S3.   No significant murmurs. PMI not displaced.  Abdomen:  Supple, nontender. Normal bowel sounds. No masses. No hepatomegaly.  Extremities:   Good distal pulses throughout. No lower extremity edema.  Musculoskeletal :moving all extremities.  Neuro:   alert and oriented x3.    EKG  Procedure date:  06/24/2009  Findings:      NSR.  68 bpm.  1st degree AV block.  Sl sagging of ST sevgments V3-V6, II.  Impression & Recommendations:  Problem # 1:  COUGH (ICD-786.2) CXR today shows no definite infilt.  Will have radiology review.  I think patient is slowly recovering from viral URI.  Check labs.  Problem # 2:  HYPERLIPIDEMIA-MIXED (ICD-272.4) Continue meds. Her updated medication list for this problem includes:    Lipitor 80 Mg Tabs (Atorvastatin calcium) ..... One tablet by mouth once daily--  Problem # 3:  CAD (ICD-414.00) I do not think the patient has evidence of active ischemia.  Probably recovering from URI.  Keep on current regimen.  If does not get better by June consider possible eval.  Patient will call if this is the case.  Other Orders: EKG w/ Interpretation (93000) TLB-CBC Platelet - w/Differential (85025-CBCD) TLB-BMP (Basic Metabolic Panel-BMET) (80048-METABOL) T-2 View CXR (71020TC)  Patient Instructions: 1)  Call if symptoms do not get better in next few wks.  Otherwise, if do, f/u in Winter.

## 2010-06-03 LAB — COMPREHENSIVE METABOLIC PANEL
ALT: 14 U/L (ref 0–35)
AST: 28 U/L (ref 0–37)
Alkaline Phosphatase: 58 U/L (ref 39–117)
CO2: 25 mEq/L (ref 19–32)
Chloride: 90 mEq/L — ABNORMAL LOW (ref 96–112)
Creatinine, Ser: 0.8 mg/dL (ref 0.4–1.2)
GFR calc Af Amer: >60 mL/min (ref >60)
GFR calc non Af Amer: >60 mL/min (ref >60)
Potassium: 3.4 mEq/L — ABNORMAL LOW (ref 3.5–5.1)
Total Bilirubin: 1.3 mg/dL — ABNORMAL HIGH (ref 0.3–1.2)

## 2010-06-03 LAB — CK TOTAL AND CKMB (NOT AT ARMC)
CK, MB: 1.5 ng/mL (ref 0.3–4.0)
CK, MB: 1.7 ng/mL (ref 0.3–4.0)
Relative Index: 1.5 (ref 0.0–2.5)
Total CK: 84 U/L (ref 7–177)

## 2010-06-03 LAB — TROPONIN I
Troponin I: 0.01 ng/mL (ref 0.00–0.06)
Troponin I: 0.03 ng/mL (ref 0.00–0.06)

## 2010-06-03 LAB — CBC
HCT: 36.8 % (ref 36.0–46.0)
HCT: 40.7 % (ref 36.0–46.0)
Hemoglobin: 12.8 g/dL (ref 12.0–15.0)
MCHC: 34.7 g/dL (ref 30.0–36.0)
MCV: 95 fL (ref 78.0–100.0)
MCV: 95.1 fL (ref 78.0–100.0)
RBC: 4.28 MIL/uL (ref 3.87–5.11)
RDW: 13.3 % (ref 11.5–15.5)
WBC: 7.4 10*3/uL (ref 4.0–10.5)

## 2010-06-03 LAB — BASIC METABOLIC PANEL
BUN: 7 mg/dL (ref 6–23)
CO2: 29 mEq/L (ref 19–32)
Calcium: 8.9 mg/dL (ref 8.4–10.5)
GFR calc non Af Amer: >60 mL/min (ref >60)
Glucose, Bld: 129 mg/dL — ABNORMAL HIGH (ref 70–99)
Sodium: 128 mEq/L — ABNORMAL LOW (ref 135–145)

## 2010-06-03 LAB — URINALYSIS, ROUTINE W REFLEX MICROSCOPIC
Bilirubin Urine: NEGATIVE
Glucose, UA: NEGATIVE mg/dL
Hgb urine dipstick: NEGATIVE
Ketones, ur: NEGATIVE mg/dL
Specific Gravity, Urine: 1.006 (ref 1.005–1.030)
pH: 7 (ref 5.0–8.0)

## 2010-06-03 LAB — DIFFERENTIAL
Basophils Absolute: 0.1 10*3/uL (ref 0.0–0.1)
Basophils Relative: 1 % (ref 0–1)
Eosinophils Absolute: 0 10*3/uL (ref 0.0–0.7)
Eosinophils Relative: 0 % (ref 0–5)
Lymphocytes Relative: 16 % (ref 12–46)
Monocytes Absolute: 0.8 10*3/uL (ref 0.1–1.0)

## 2010-06-03 LAB — GLUCOSE, CAPILLARY
Glucose-Capillary: 121 mg/dL — ABNORMAL HIGH (ref 70–99)
Glucose-Capillary: 158 mg/dL — ABNORMAL HIGH (ref 70–99)

## 2010-06-03 LAB — PROTIME-INR: INR: 1 (ref 0.00–1.49)

## 2010-06-03 LAB — POCT CARDIAC MARKERS

## 2010-07-12 NOTE — Consult Note (Signed)
Aimee [name], Aimee [name]                ACCOUNT NO.:  [id number]   MEDICAL RECORD NO.:  [id number]          PATIENT TYPE:  INP   LOCATION:  5017                         FACILITY:  [facility]   PHYSICIAN:  Aimee Sidle, MD DATE OF BIRTH:  08-19-1932   DATE OF CONSULTATION:  03/14/2007  DATE OF DISCHARGE:                                 CONSULTATION   PRIMARY CARDIOLOGIST:  Dr. Dietrich [name].  PRIMARY CARE Aimee [name]:  Dr.  Ivery [name]   REQUESTING PHYSICIAN:  Dr. Nadara Mustard, MD.   PATIENT PROFILE:  77 year old Caucasian female with prior history of CAD  and CABG, who was admitted for left total knee arthroplasty and  postoperatively is feeling restless with an abnormal ECG.   PROBLEM LIST:  1. Coronary artery disease.      [name].     Status post CABG x3 in 2000 with [name] LIMA to the LAD, vein       graft to the obtuse marginal, vein graft to the distal RCA.      b.     Cardiac catheterization, July of 2006, native multivessel       disease with three of three patent grafts.  There was an 80%       stenosis distal to the vein graft to the RCA anastomosis.       Ejection fraction 60%.      c.     July 2006 negative adenosine Myoview.  The patient was       medically managed.  2. Hypertension.  3. Hyperlipidemia.  4. Diabetes mellitus.  5. Gastroesophageal reflux disease.  6. Hypothyroidism.  7. Osteoarthritis/degenerative joint disease.      [name].     Status post left total knee arthroplasty, March 13, 2007.   HISTORY OF PRESENT ILLNESS:  [name] 77 year old Caucasian female with [name]  history of CAD status post CABG in 2000, and her last cardiac  catheterization was in July of 2006 revealing patent grafts with an 80%  stenosis distal to the vein graft to the RCA anastomosis.  She  subsequently underwent adenosine Myoview stress testing which was  negative for ischemia, and she has been medically managed.  On January  12 she noted some mild reproducible left sternal chest aching with  radiation to the scapulae and shoulders lasting [name] few hours and  resolving spontaneously.  She has had this type of discomfort more or  less since her bypass surgery and believes it is musculoskeletal in  nature as she can push on her chest and make it occur.  Most importantly  that type of discomfort is not similar to previous angina which is more  of [name] sharp, shooting pain from the right side of her chest across to the  left and associated with numbness of her arms.  She was admitted to Dr.  Audrie Lia service yesterday, January 14, for [name] left total knee arthroplasty  and postoperatively has been feeling jittery and just not right.  She is also somewhat restless.  She has not had any chest pain or  shortness of breath.  She did  have some nausea and vomiting yesterday.  Because of not feeling right, an ECG was obtained this morning which  showed ST depression, T-wave inversion in anterolateral leads which was  more pronounced than what was noted on ECG March 28, 2022.  She is awaiting  transfer to telemetry.   ALLERGIES:  Codeine.   CURRENT MEDICATIONS:  1. Morphine PCA.  2. Protonix 40 mg b.i.d.  3. HCTZ 25 mg daily.  4. Lisinopril 40 mg daily.  5. Lipitor 80 mg at bedtime.  6. Actos 15 units daily.  7. Norvasc 10 mg daily.  8. Lopressor 75 mg b.i.d. (last dose was yesterday morning).  9. Aspirin 81 mg daily.  10.Coumadin per pharmacy.  11.Colace 100 mg b.i.d.  12.Ferrous sulfate 325 mg t.i.d.  13.Synthroid 112 mcg daily.  14.Ancef 1 gram IV q.6 hours.   FAMILY HISTORY:  Mother died at 55, possibly of an MI.  Father died at  50 of an MI.  She has [name] brother who had [name] history of CAD and CABG and  another brother who has history of CAD and PCI and [name] sister with [name]  history of CAD and PCI.   SOCIAL HISTORY:  Patient lives in [place] by herself.  She lives in [name]  senior apartment complex.  She is retired from the [employer].  She denies any tobacco, alcohol or drug use.   She is not  routinely exercising.   REVIEW OF SYSTEMS:  Positive for shakes and feeling jittery, as well  as just not right as outlined in the HPI.  She is restless.  She did  have some chest discomfort on Monday which she describes as being  musculoskeletal in nature.  She has [name] postoperative left knee pain and  also chronic arthralgias.  She did have nausea and vomiting yesterday.  She does have [name] history of diabetes.  Otherwise, all systems reviewed  and negative.   PHYSICAL EXAMINATION:  VITAL SIGNS:  On physical exam temperature 97.7,  heart rate 88, respirations 18, blood pressure 104/61, pulse oximetry  95% on two liters.  GENERAL:  Pleasant white female in no acute distress, awake, alert and  oriented times three.  NECK:  No bruits, no JVD.  LUNGS:  Respirations are unlabored.  CARDIAC:  Regular S1, S2, no S3, no S4 or murmurs.  ABDOMEN:  Benign, soft, nontender.  SKIN:  Skin is warm and dry, pink.  EXTREMITIES: No clubbing, cyanosis or edema.  Dorsalis pedis and  posterior tibial pulses are 2+ and equal bilaterally.   DATA:  Chest x-ray not applicable or not done.  EKG shows sinus rhythm  at [name] rate of 97 beats per minute, normal axis.  She has less than 1-mm  ST-segment depression and flattening or downsloping depression and  flattening along with T-wave inversion in 1, AVL and V1 through V6.  This is more pronounced since her ECG on Mar 28, 2022.  Lab work:  Hemoglobin 9.2, hematocrit 26.7, WBC 12,600, platelets 155,000.  Sodium  131, potassium 3.9, chloride 97, CO2 28, BUN 14, creatinine 1.06,  glucose 176.  Calcium 8.2, INR 1.  Cardiac markers are pending.   ASSESSMENT AND PLAN:  1. Abnormal electrocardiogram/coronary artery disease. The patient did      have some chest discomfort on January 12, describes this as more      musculoskeletal in nature and somewhat chronic since her bypass      surgery.  Her electrocardiogram is now with more pronounced  ST      depression,  T-wave inversion compared to her preoperative ECG.  She      denies any chest pain or shortness of breath at this time but does      appear restless.  Her heart rate is up slightly.  Upon review she      has not received [name] beta blocker since her preoperative dose.  She      is due for [name] dose this morning.  We will plan to transfer her to      telemetry and cycle cardiac markers.  She will continue aspirin,      statin, beta blocker, ACE inhibitor and with [name] slight reduction in      hemoglobin and hematocrit to 9.2 and 26.7, will transfuse with one      unit of packed red blood cells as she does have known 80% stenosis      in the distal right coronary artery and could be experiencing some      demand ischemia.  If, from [name] clinical standpoint, she does not      improve or remains tachycardia and has [name] low threshold, will check      [name] CT of chest to rule out pulmonary embolism in this postoperative      setting.  If okay with Orthopedics, would recommend usage of      prophylactic DVT dose of Lovenox.  2. Hypertension, stable.  3. Hyperlipidemia.  Continue statin therapy.  4. Diabetes mellitus.  Continue home medications/sliding-scale      insulin.  5. Hypothyroidism.  Synthroid has been resumed.  6. Status post left total knee arthroplasty per Orthopedics.      Nicolasa Ducking, ANP      Aimee Sidle, MD  Electronically Signed    CB/MEDQ  D:  03/14/2007  T:  03/14/2007  Job:  [id number]

## 2010-07-12 NOTE — Assessment & Plan Note (Signed)
[facility]                            CARDIOLOGY OFFICE NOTE   NAME:[name], [name]                       MRN:          [id number]  DATE:12/13/2006                            DOB:          10-11-1932    IDENTIFICATION:  [name] is a 77 year old woman with coronary artery  disease, hypertension, dyslipidemia.  She was last seen in April.   In the interval, she has been doing okay.  She is working on moving now  and has been active with this.  She notes some tightness in her chest on  days she has been doing a lot.  She does not think it is necessarily  worsening artery issues.  She has been able to keep up her level of  activity.   She notes occasional dizziness, but not with any change in position.  It  can occur on and off.   No change in her breathing.   She has noted some increased reflux recently.  She is on Prilosec b.i.d.   Still having ankle and knee problems.  Needs to schedule a return for  [name].   CURRENT MEDICATIONS:  1. Vitamin D occasionally.  2. Lisinopril 40.  3. Actos 15.  4. Norvasc 10.  5. Prilosec b.i.d.  6. Tylenol.  7. Aspirin 81 b.i.d.  8. Synthroid 112 mcg.  9. Lipitor 80.  10.Hydrochlorothiazide 25.  11.Metoprolol 75 b.i.d.   PHYSICAL EXAM:  The patient is currently in no distress.  Blood pressure is 135/77, pulse 62 and regular, weight 165.  LUNGS:  Clear.  CARDIAC:  Regular rate and rhythm.  S1, S2.  No S3.  No murmurs.  ABDOMEN:  Benign.  Obese.  EXTREMITIES:  Lipedema noted.  Left ankle more swollen than the right.   IMPRESSION:  1. Coronary artery disease.  Last catheterization in July 2006 with      100% left anterior descending, 75% diagonal, 100% circumflex, 99%      obtuse marginal, 80% posterolateral segment artery.  The left      internal mammary artery to the left anterior descending was patent;      saphenous vein graft to obtuse marginal patent; saphenous vein      graft to distal right  coronary artery had an 80% lesion after the      anastomosis.  Myoview was negative for ischemia.  The patient has      been active.  She has intermittent chest discomfort.  Some may be      reflux.  I have given her a prescription for Protonix b.i.d. to      try.  If indeed things improve, she should follow up in      gastroenterology.  Otherwise, I am not convinced with her increased      activity that this is any worse than the angina.  If her symptoms      worse, though, she should call.  2. Hypertension.  Adequate control.  Would continue.  3. Dyslipidemia.  Lipid panel back in the spring looks good.  LDL 75,  HDL 57.  Would follow.  4. Degenerative joint disease.  Followed with [name].   I will set followup for the spring.  Sooner if problems develop.     Pricilla Riffle, MD, [facility]  Electronically Signed    PVR/MedQ  DD: 12/13/2006  DT: 12/14/2006  Job #: [id number]

## 2010-07-12 NOTE — Assessment & Plan Note (Signed)
[facility]                            CARDIOLOGY OFFICE NOTE   NAME:[name], [name]                       MRN:          [id number]  DATE:09/12/2007                            DOB:          February 17, 1933    IDENTIFICATION:  [name] is a 77 year old woman who I just saw in  clinic back on June 15.  She has a history of CAD, hypertension, and  dyslipidemia.  She is status post CABG in 2000.  Her last  catheterization in 2006 showed patent grafts with an 80% distal vein  graft lesion to Aimee RCA.  Myoview was negative for ischemia.   When I saw her last, she seemed to be doing fairly well.  I increased  her lisinopril because her blood pressure was running high.   On September 06, 2007, she developed chest pressure across her whole chest  with shortness of breath.  She went actually to Aimee emergency room, was  treated with IV Lasix, and sent home on p.o. Lasix.  I told she had some  congestive heart failure though her BMP was normal.  She has since been  seen in Internal Medicine Clinic and her Actos was discontinued.   She has had complaints of some mid chest pain that seems to be  associated with food intake, food may be getting stuck some.  She also  notes continued reflux.  She has not had another spell like Aimee night  that she went into Aimee emergency room.   CURRENT MEDICINES:  1. Synthroid 125 mcg.  2. Norvasc 5.  3. Aspirin 81 b.i.d.  4. Metoprolol 50 b.i.d.  5. Lisinopril 5.  6. Lasix 20.  7. Hydrochlorothiazide 25.  8. Lipitor 80.  9. Tylenol p.r.n.  10.Prilosec b.i.d.  11.Actos has been discontinued.   PHYSICAL EXAMINATION:  GENERAL:  [name] is in no distress at rest  though she is somewhat anxious talking about everything.  VITAL SIGNS:  Blood pressure 159/77; pulse is 72; and weight 160, which  is down 2 pounds from June 15.  LUNGS:  Clear.  NECK:  JVP is normal.  CARDIAC:  Regular rate and rhythm.  S1 and S2.  No S3.  No  significant  murmurs.  ABDOMEN:  Benign.  No hepatomegaly.  EXTREMITIES:  No significant edema.   A 12-lead EKG shows normal sinus rhythm, 69 beats per minute.  First-  degree AV block.  Nonspecific ST-T wave changes.   IMPRESSION:  Chest pressure, shortness of breath concerning.  I think  there are 2 types of discomfort; one is Aimee band-like that she had on  September 06, 2007, and Aimee other is discrete mid substernal chest pain.  Aimee  first pain is more worrisome, especially in Aimee setting of fluid  buildup.  For now, I would like to schedule Myoview scan, she is about 9  years post bypass to see if there is any inducible ischemia and we will  plan for Aimee morning.   In regard to Aimee other type of pain which seems to be more GI, but again  we will need to be evaluated with a Myoview first.  I would like to have  [name] set up to be seen by Dr. Russella Dar, who she has seen in Aimee  past.   I would continue her current medicines for now, increase her Norvasc for  her hypertension.   Note, we will need to see if she has had a thyroid checked recently,  would be important to reevaluate, her lipids back in June were  excellent.     Pricilla Riffle, MD, [facility]  Electronically Signed    PVR/MedQ  DD: 09/12/2007  DT: 09/13/2007  Job #: [id number]   cc:   Barry Dienes. Eloise Harman, M.D.

## 2010-07-12 NOTE — Assessment & Plan Note (Signed)
[facility]                            CARDIOLOGY OFFICE NOTE   NAME:[name], Aimee EDICK                       MRN:          [id number]  DATE:11/11/2007                            DOB:          [birth year]    IDENTIFICATION:  [name] is a 77 year old woman that follow in  clinic.  I last saw her in July.  She has a history of CAD (status post  CABG in 2000), hypertension, and dyslipidemia.   When I saw her last, she had had an episode of chest pressure that was  actually a couple varieties, one was concerning.   Because of this, I set her up for a Myoview scan.  She had this done on  September 13, 2007, which showed normal perfusion with an LVEF of 76%.   The patient also had an echocardiogram done on September 20, 2007, that  initially read out as an LVEF of 45% with some hypokinesis in the  inferior, posterior, and septal wall.  I have reviewed this and I think  overall the LVEF is normal with some septal, basal, and posterior  hypokinesis.  She has mild mitral regurgitation as well.   Since seen, she actually is doing pretty well.  No chest pain.  No  shortness of breath.  She is doing some walking.   She is taking hydrochlorothiazide and Lasix.   Current medicines include Norvasc 5, metoprolol 50 b.i.d., lisinopril 5,  furosemide 20, Synthroid 100, Nexium 40, aspirin 81, hydrochlorothiazide  25, and Lipitor 80.   PHYSICAL EXAMINATION:  GENERAL:  The patient is in no distress.  VITAL SIGNS:  Blood pressure 145/79, pulse is 70 and regular, weight 158  down 2 pounds from previous.  LUNGS:  Clear.  CARDIAC:  Regular rate and rhythm.  S1 and S2.  No S3.  No murmurs.  ABDOMEN:  Benign.  EXTREMITIES:  No edema.   IMPRESSION:  1. Coronary artery disease.  Myoview as noted above normal.  We would      continue on medical therapy.  2. Dyslipidemia, excellent control in lipids back in June with an LDL      of 70, HDL of 53, continue.  3. Edema.  She did  have some edema.  Actos was stopped.  I would check      her electrolytes on furosemide and hydrochlorothiazide, consider      switching her just to furosemide.   Otherwise, I will set followup for the winter.  I will be in touch with  her regarding the blood work.  BMET and BNP today.     Pricilla Riffle, MD, [facility]  Electronically Signed    PVR/MedQ  DD: 11/11/2007  DT: 11/12/2007  Job #: [id number]   cc:   Barry Dienes. Eloise Harman, M.D.

## 2010-07-12 NOTE — Op Note (Signed)
NAMESTEPHENIE, [name]                ACCOUNT NO.:  [id number]   MEDICAL RECORD NO.:  [id number]          PATIENT TYPE:  INP   LOCATION:  2550                         FACILITY:  [facility]   PHYSICIAN:  Nadara Mustard, MD     DATE OF BIRTH:  Jan 18, 1933   DATE OF PROCEDURE:  DATE OF DISCHARGE:                               OPERATIVE REPORT   PREOPERATIVE DIAGNOSIS:  Osteoarthritis left knee.   POSTOPERATIVE DIAGNOSIS:  Osteoarthritis left knee.   PROCEDURE:  Left total knee arthroplasty with DePuy rotating platform.   COMPONENTS:  A #3 tibia, #3 femur, 10-mm poly tray posterior stabilized  with a 35-mm patella.   SURGEON:  Nadara Mustard, MD   ANESTHESIA:  General plus femoral block.   ESTIMATED BLOOD LOSS:  Minimal.   ANTIBIOTICS:  Kefzol 1 gram.   DRAINS:  None.   COMPLICATIONS:  None.   TOURNIQUET TIME:  Approximately 39 minutes at 300 mmHg.   DISPOSITION:  To PACU in stable condition.   INDICATIONS FOR PROCEDURE:  The patient is a 77 year old woman with  osteoarthritis of the left knee.  She has failed conservative care.  She  has pain with activities of daily living and presents at this time for  total knee arthroplasty.  Risks and benefits were discussed including  infection, neurovascular injury, persistent pain, need for additional  surgery as well as DVT.  The patient states she understands and wished  to proceed at this time.   DESCRIPTION OF PROCEDURE:  The patient was brought to OR room 10 after  undergoing a femoral block.  The patient, then underwent a general  anesthetic.  After adequate levels of anesthesia were obtained, the  patient's left lower extremity was prepped using DuraPrep, draped in a  sterile field and Ioban was used to cover all exposed skin.  A midline  incision was made.  This was carried down to a medial parapatellar  retinacular incision.  The patella was everted.  The drill was used to  enter the canal.  The IM guide was used for the femur.   This was set for  5 degrees of valgus and 11 mm was taken off the distal femur.  This was  sized for a 3 and the chamfer cuts were made for a size 3 femur.   Attention was then focused on the tibia.  The external alignment guide  was used for the tibia.  This took 10 mm off the tibia with neutral  varus and valgus and no posterior slope.  This was set for a size 3 with  a size 3 covering the tibial plateau.  This was pegged and the keel  punch was placed.   Attention was then focused on the femur and the box cut was used to make  a box cut for a size 3 femur.  Trial components were placed and a 10-mm  poly posterior stabilized.  The patient had full flexion and extension  with stable varus and valgus stress.  The trial components were removed.  The patella was then resurfaced with 10 mm taken  off the patella.  This  was sized for a 35 in the peg cuts were made for the size 35 patella.   The knee was irrigated with pulse lavage.  The tibia and then femoral  components were inserted and loose cement was removed.  These were  impacted.  The knee was again irrigated with pulse lavage and the tibial  tray was placed.  The knee was kept in extension while the cement  hardened and the patellar component was placed with the clamp placed on  the patella.  This was kept in place until the cement hardened.  All  loose cement was removed.  The knee was again irrigated with pulse  lavage.  The tourniquet was deflated and hemostasis was obtained.  After  the cement had hardened, the was placed through a full range of motion.  There was no subluxation of the patella.  A lateral release was not  performed.  The posterior capsule of the knee was injected with a total  of 30 mL of 0.50% Marcaine plain.  The retinaculum was closed using #1  Vicryl.  Subcutaneous was closed using Vicryl.  Skin was closed using  approximating staples.  The wound was covered with Adaptic orthopedic  sponges, sterile Webril  and a Coban dressing.  The patient was then  extubated and taken to the PACU in stable condition.  Plan for  ambulation starting tomorrow.      Nadara Mustard, MD  Electronically Signed     MVD/MEDQ  D:  03/13/2007  T:  03/13/2007  Job:  [id number]

## 2010-07-12 NOTE — Discharge Summary (Signed)
[name], [name]                ACCOUNT NO.:  [id number]   MEDICAL RECORD NO.:  [id number]          PATIENT TYPE:  INP   LOCATION:  3706                         FACILITY:  [facility]   PHYSICIAN:  Nadara Mustard, MD     DATE OF BIRTH:  [birth year]   DATE OF ADMISSION:  03/13/2007  DATE OF DISCHARGE:  03/22/2007                               DISCHARGE SUMMARY   ADMISSION DIAGNOSIS:  1. Osteoarthritis, left knee.  2. Coronary artery disease, status post coronary artery bypass graft,      2000.  Cardiac catheterization, July 2006, with multivessel disease      and three patent grafts.  Ejection fraction 60%.  3. Hypertension.  4. Hyperlipidemia.  5. Diabetes mellitus.  6. Gastroesophageal reflux disease.  7. Hypothyroidism.   DISCHARGE DIAGNOSES:  1. Osteoarthritis, left knee.  2. Postoperative post-hemorrhagic anemia, requiring blood transfusion.  3. Postoperative tachycardia and chest pain with echocardiogram      without evidence of ischemic event.  4. Pulmonary embolism ruled out with chest CT.  5. Postoperative hypokalemia, resolved.  6. Postoperative hyponatremia, resolved.  7. Postoperative supraventricular tachycardia, resolved at discharge.   PROCEDURE:  On March 13, 2007, patient underwent left total knee  arthroplasty with DePuy rotating platform, #3 tibial component, #3  femoral component and 10 mm poly tray posterior stabilized with 35 mm  patella.  This was performed by Dr. Lajoyce Corners under general anesthesia plus  femoral block, left.   CONSULTATIONS:  Cardiology, Dr. Diona Browner and other associates from  [facility].   BRIEF HISTORY:  Patient is a 77 year old female with chronic and  progressive osteoarthritis of the left knee.  She has failed  conservative treatment.  Patient has pain with activities of daily  living.  Risks and benefits of surgical intervention were discussed and  she wished to proceed with a left total knee arthroplasty.   BRIEF HOSPITAL  COURSE:  The patient tolerated the procedure under  general anesthesia.  Postoperatively, she developed symptoms of mild  diffuse chest pain, stating she felt jittery and not right.  She was  restless.  Electrocardiogram was obtained, showing ST depression and T-  wave inversions in anterolateral leads, which was more pronounced than  the previous EKG for comparison.  Cardiac consult was obtained and  patient was seen and sent to the  telemetry unit for further  observation.  Patient was placed on Lovenox.  Cardiac enzymes were drawn  and deemed to show no ischemic event.  Patient was evaluated for  possible pulmonary embolism and chest CT was negative.  Patient had  episodes of tachycardia, which were treated with Cardizem.  Eventually  she did stabilize from a cardiac standpoint and was deemed stable for  discharge home.  Echocardiogram on January 15 showed 60% ejection  fraction, small pericardial effusion.  Patient did have known history of  coronary artery disease with findings as stated above in admission  diagnosis.  Patient developed postoperative anemia, requiring blood  transfusion.  She received one unit of packed red blood cells during the  hospital stay.  Hemoglobin and hematocrit  were stable at discharge.  The  lowest value of hemoglobin was 7.8 on March 16, 2007.  After  transfusion, hemoglobin returned to 9.1 with hematocrit 26.3.  Patient  with hypokalemia, treated with supplementation orally and IV.  Hyponatremia stabilized with adjustments in IV fluid.   From an orthopedic standpoint, she was started on the usual program for  ambulation and gait training, weightbearing as tolerated on the  operative extremity.  She was treated with CPM machine for passive range  of motion.  Physical therapy did see the patient daily and she was able  to ambulate only short distances.  Prior to discharge, she did ambulate  65 feet.  She required assistance with this.  It was felt she  would  benefit from further rehabilitation.  Patient agreed to short-term  nursing home placement for rehab and a bed was found available at  [facility] nursing facility.  Patient continued to use the knee  immobilizer for ambulatory purposes during the hospital stay.  The knee  immobilizer was removed when at rest or in the CPM period.  She was  taught active range of motion to the knee.  At discharge, CPM was a 0-50  degrees.   Patient was on Lovenox during the hospital stay.  She will be changed to  Coumadin at discharge 1 mg daily.  Dressing changes were done daily and  the patient's wound was healing well.  Two small fracture blisters,  distal to the wound, healing without signs of infection.  At discharge,  patient was afebrile with vital signs stable.  She was voiding without  difficulty.  She was taking a regular diet at discharge.   OTHER PERTINENT LABORATORY VALUES:  Chest x-ray on January 15 with mild  bibasilar atelectasis with tiny amount of fluid, cardiomegaly without  edema.  Patient was treated with incentive spirometry during the  hospital stay.  Postoperative x-ray of the left knee showed satisfactory  position, left total knee replacement.  Hemoglobin and hematocrit on  March 20, 2007 were 9.1 and 26.3.  INR at discharge 0.9.  Chemistry  studies on January 22 with sodium 131, potassium 3.5, chloride 91, CO2  32, glucose 106, remaining values normal.  Iron studies on January 22  showed iron 45, total iron binding capacity 211, percent saturation 21,  UIBC 166 and ferritin 541.  A TSH level was drawn.  However, results  were pending at the time of discharge.  Her hypothyroidism was treated  with her usual medications during the hospital stay.   CONDITION ON DISCHARGE:  Stable.   PLAN:  Patient will be discharged to [facility] nursing facility.  There, she should continue to receive physical therapy on a daily basis.  She should receive ambulation and gait training,  using a walker.  She  should wear her knee immobilizer when out of bed and walking.  When she  is able to do a straight leg raise and quad set, the knee immobilizer  may be discontinued.  While at bed rest, she does not need the knee  immobilizer.  Patient should receive active range of motion exercises to  the left knee.  Extension 0 degrees with flexion goal being greater than  90 degrees.  Strengthening and stretching exercises to be performed, as  well.  Would care includes dressing as needed.  The wound may be wet for  hygiene purposes.  The patient's staples should be removed by Dr. Lajoyce Corners  in his office two to three weeks  following surgery.  Arrangements to be  made for patient to be seen by Dr. Lajoyce Corners on a two to three-week  postoperative basis.  His office can be notified by calling [phone] to  arrange this appointment.  She has a followup appointment posted in her  chart for Dr. Dietrich Pates, [phone], on May 02, 2007, at 4 p.m.  She  will resume a regular diet.   MEDICATIONS AT DISCHARGE INCLUDE:  1. Coumadin 1 mg daily times four weeks.  2. Vicodin one to two every 4-6 hours as needed for pain.  3. Protonix 40 mg p.o. b.i.d.  4. Hydrochlorothiazide 25 mg daily.  5. Lisinopril 40 mg daily.  6. Lipitor 80 mg q.h.s.  7. Actos 15 mg daily.  8. Colace one p.o. b.i.d.  9. Ferrous sulfate 325 mg one p.o. t.i.d.  10.Synthroid 112 mcg p.o. daily.  11.Insulin sliding scale.  12.K-Dur 10 mEq p.o. b.i.d.  13.Norvasc 5 mg daily.  14.Laxative of choice and enema of choice.  15.Robaxin 500 mg one every 6 hours as needed for spasm.  16.Ambien 5 mg one q.h.s. p.r.n. sleep.   If there are questions or concerns regarding her orthopedic care, please  notify Dr. Audrie Lia office.  If there are cardiac concerns, please notify  [facility].   CONDITION ON DISCHARGE:  Stable.      Wende Neighbors, P.A.      Nadara Mustard, MD  Electronically Signed    SMV/MEDQ  D:  03/22/2007   T:  03/22/2007  Job:  [id number]

## 2010-07-12 NOTE — Discharge Summary (Signed)
NAMEKRISHA, [name]                ACCOUNT NO.:  [id number]   MEDICAL RECORD NO.:  [id number]          PATIENT TYPE:  INP   LOCATION:  3731                         FACILITY:  [facility]   PHYSICIAN:  Maisie Fus C. Wall, MD, FACCDATE OF BIRTH:  30-Oct-1932   DATE OF ADMISSION:  03/31/2007  DATE OF DISCHARGE:                               DISCHARGE SUMMARY   Dictation greater than 25 minutes.   The patient has allergy to CODEINE.   FINAL DIAGNOSES:  1. 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 Lewayne Bunting.  2. Admission with tachy palpitations and chest discomfort.  3. Electrocardiogram showing recurrent supraventricular tachycardia.  4. Troponin I study is negative.  5. Stage III sacral decubitus measuring 2.5 x 3 x 3 cm.  6. Stage II left buttock decubitus measuring 3.3 x 3 x 2 cm.  7. CT chest April 01, 2007. No pulmonary embolism.  8. Ultrasound left leg April 01, 2007 left femoral vein deep venous      thrombosis. The patient will continue Coumadin therapy.   SECONDARY DIAGNOSES:  1. History of coronary artery disease.      a.     Coronary artery bypass graft surgery x3 in 2000.      b.     Left heart catheterization July 2006, 3/3 patent grafts, 80%       stenosis distal to the vein graft at the right coronary artery       anastomosis. Ejection fraction 60%.      c.     July 2006 negative adenosine Myoview.  2. Hypertension.  3. Hyperlipidemia.  4. Diabetes.  5. Gastroesophageal reflux disease.  6. Hypothyroidism.  7. Osteoarthritis, degenerative joint disease status post left total      knee arthroplasty March 13, 2007.   PROCEDURE:  1. April 01, 2007. Ultrasound left leg with finding of left femoral      vein deep venous thrombosis.  2. CT of the chest April 01, 2007, no pulmonary emboli.  3. 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 Lewayne Bunting.   BRIEF HISTORY:  [name] is a 77 year old female. She had a total knee  arthroplasty January 14. At that time, she was seen by cardiology for  chest discomfort, cardiac markers were negative. A 2-D echocardiogram  showed ejection fraction with normal left ventricular function, no wall  motion abnormalities. Her postoperative course was complicated by  postoperative anemia which required transfusion as well as SVT. Her beta  blocker dose was titrated to 100 mg twice a day and she was discharged  January 23.   Since the discharge, she has had almost daily tachy palpitations  associated with chest tightness, mild shortness of breath and nausea.  She is anorexic and has frequent nausea and vomiting. The morning of  admission, February 1, she had an episode of tachycardia palpitations.  She asked the staff to be  seen in the emergency room. At arrival in the  emergency room, she was noted to be in SVT and treated with adenosine.  She broke eventually after a second dose of adenosine into sinus rhythm.  Currently in sinus tach during this examination with first degree AV  block and with no complaints.   HOSPITAL COURSE:  The patient admitted through the emergency room  February 1. She was seen in consultation by cardiology. Rate control  proved very difficult. She required elevated beta blockers as well as IV  Cardizem. She was seen in consultation by Dr. Lewayne Bunting of  electrophysiology who diagnosed a long R-P tachycardia possibly reentry  and plan for electrophysiology study on February 4. Prior to that, she  was seen by the wound care team for sacral decubitus classed as stage  III as well as a left buttock decubitus classified as stage II.  Recommended treatment as given below. The patient also evaluated for a  swollen left leg and was shown to have a left femoral vein deep venous  thrombosis. Coumadin has been  continued. A CT of the chest showed no  pulmonary embolism. The patient underwent a procedure on February 4. A  radiofrequency catheter ablation of inducible AVNRT with slow pathway  ablation. The patient has had not post procedural SVT on discharging  post procedure day #1.   DISCHARGE MEDICATIONS:  1. Morphine as needed.  2. Protonix 40 mg twice daily.  3. Coumadin 2.5 mg daily.  4. Prilosec 20 mg twice daily.  5. Hydrochlorothiazide 25 mg daily.  6. Lisinopril 40 mg daily.  7. Lipitor 80 mg daily.  8. Actos 15 mg daily.  9. Colace 100 mg twice daily.  10.Ferrous sulfate 325 mg 3 times daily.  11.Synthroid 112 mcg daily.  12.K-Dur 10 mEq twice daily.  13.Norvasc 5 mg daily.  14.Sliding scale insulin for her diabetes.  15.Add aspirin 81 mg daily.  16.Add metoprolol 50 mg twice daily.   SACRAL DECUBITUS CARE:  The wound team here at [facility]  recommends for her stage III sacral decubitus an air mattress as well as  Aquacel 68390 applied to the sacrum daily, remove previous Aquacel by  moistening with sterile normal saline. For the left buttock deciduitis,  Mepilex border. Apply Mepilex border to the left buttock and change  every 5 days or as needed. She will have followup with Dr. Lewayne Bunting  in 4 weeks for the SVT. This appointment will be scheduled and dictated  later. Also laboratory studies will be dictated late.      Maple Mirza, PA      Maisie Fus C. Daleen Squibb, MD, [facility]  Electronically Signed    GM/MEDQ  D:  04/04/2007  T:  04/04/2007  Job:  [id number]   cc:   Doylene Canning. Ladona Ridgel, MD  [facility]

## 2010-07-12 NOTE — Assessment & Plan Note (Signed)
[facility]                            CARDIOLOGY OFFICE NOTE   NAME:[name], Aimee HUNTON                       MRN:          [id number]  DATE:05/10/2007                            DOB:          April 06, 1932    IDENTIFICATION:  [name] is a 77 year old woman who I have followed  in clinic.  She was last seen back in October with a history of CAD,  hypertension, dyslipidemia.   The patient was hospitalized in January for a knee replacement.  She was  seen by cardiology for chest discomfort.  Echocardiogram was done and  was negative.  She required transfusion and had an episode of SVT.  Her  beta-blocker was titrated.   The patient was discharged and readmitted with significant SVT on  March 31, 2007.  She underwent radiofrequency ablation of the AVNRT  and was discharged home.  Initially though, she was discharged to  [facility].   Since this last episode, she is slowly on a road to recovery.  She  denies palpitations.  She notes some chest pain, but she has had this  intermittently chronically.  She does not think it is her heart.  It is  not associated with any particular activity.  She thinks it is more gas-  like.  Her breathing is okay.  Her knee is getting better.  She is at  home with PT.   CURRENT MEDICATIONS:  1. HCTZ 25 daily.  2. Lipitor 80.  3. Tylenol p.r.n.  4. Prilosec b.i.d.  5. Actos 15.  6. Synthroid 0.125 mcg.  7. Aspirin 81.  8. Norvasc 5.   PHYSICAL EXAMINATION:  GENERAL:  The patient is in no distress.  VITAL SIGNS:  Blood pressure 130/76, pulse is 79 and regular.  Weight  162.  LUNGS:  Clear.  CARDIAC:  Regular rate and rhythm.  S1, S2.  No S3.  Grade 1/6 systolic  murmur.  ABDOMEN:  Benign.  EXTREMITIES:  Left leg with no significant pitting edema.  It is a  little warmer, but no significant erythema around the knee.   A 12-lead EKG shows sinus rhythm at 79 beats per minute.  Slight sagging  of the ST segments in  V2-V6, T-wave inversion in I.  Note, this was  present previously in October.   IMPRESSION:  1. Coronary artery disease, status post coronary artery bypass graft      in 2000.  Cardiac catheterization in July 2006, showed patent      grafts and 80% distal vein graft to the right coronary artery.      Myoview was negative for ischemia.  The patient is postop with the      supraventricular tachycardia complication, otherwise did okay.      Would continue on current medicines.  2. History of supraventricular tachycardia, status post ablation.  3. History of dyslipidemia.  Will need to get lipids when I see her      again.   I will set followup for June.  Continue on current medicines.     Pricilla Riffle, MD, [facility]  Electronically  Signed    PVR/MedQ  DD: 05/10/2007  DT: 05/12/2007  Job #: [id number]

## 2010-07-12 NOTE — Assessment & Plan Note (Signed)
[facility]                            CARDIOLOGY OFFICE NOTE   NAME:Huizenga, JAKAIYA NETHERLAND                       MRN:          [id number]  DATE:08/12/2007                            DOB:          07-02-1932    IDENTIFICATION:  [name] is a 77 year old woman.  She has a history  of CAD, hypertension, and dyslipidemia.  She is status post CABG in  2000.  Last cardiac catheterization in 2006 showed patent grafts with an  80% distal vein graft to the RCA.  Myoview was negative for ischemia.   The past year, the patient has had a lot of difficulty.  She underwent  knee replacement.  After that, she had SVT and underwent ablation.  She  went on a slow road to recovery, was admitted to a nursing home, but has  now been discharged.   CURRENT MEDICINES:  Include:  1. Synthroid 125 mcg.  2. Aspirin 81 b.i.d.  3. Norvasc 5.  4. Hydrochlorothiazide 25.  5. Lipitor 80.  6. Tylenol p.r.n.  7. Prilosec b.i.d.  8. Actos 15.   PHYSICAL EXAMINATION:  The patient is in no distress.  She notes no  palpitations.  Her breathing is okay.  Notes only occasional shooting  left chest pain that occurs with and without activity.  Not new.  She  has some shoulder arthritis.  VITAL SIGNS:  Her blood pressure is 130/80, it has been in the 150s/76  at home; pulse is 69; and weight 162.  LUNGS:  Clear.  CARDIAC:  Regular rate and rhythm.  S1 and S2.  No S3.  No significant  murmurs.  ABDOMEN:  Obese and benign.  EXTREMITIES:  No edema.   IMPRESSION:  1. Coronary artery disease appears to be stable.  The shooting pain, I      do not think is ischemic.  I would continue on medicines.  2. Hypertension.  With her blood pressures running high at home and      her diabetes, I would add back lisinopril 5.  Indeed, she may need      to go back on higher doses but with all she has been through, her      blood pressure is not too high at present.  Continue on Norvasc.  I      will  follow up in September.  3. Dyslipidemia.  Continue on Lipitor, will need followup.     Pricilla Riffle, MD, [facility]  Electronically Signed    PVR/MedQ  DD: 08/12/2007  DT: 08/13/2007  Job #: [id number]

## 2010-07-12 NOTE — H&P (Signed)
[name], [name]                ACCOUNT NO.:  [id number]   MEDICAL RECORD NO.:  [id number]          PATIENT TYPE:  INP   LOCATION:  3731                         FACILITY:  [facility]   PHYSICIAN:  Maisie Fus C. Wall, MD, FACCDATE OF BIRTH:  Jun 13, 1932   DATE OF ADMISSION:  03/31/2007  DATE OF DISCHARGE:                              HISTORY & PHYSICAL   PRIMARY CARDIOLOGIST:  Pricilla Riffle, MD.   PATIENT PROFILE:  A 77 year old Caucasian female with prior history of  CAD and SVT who presents with recurrent SVT and chest pain.   PROBLEMS:  1. SVT.      a.     In January 2009, a 2-D echocardiogram EF 60%.  2. CAD.      a.     Status post CABG x3 in 2000 with LIMA to the LAD, vein graft       to the obtuse marginal, vein graft to the distal RCA.      b.     Cardiac catheterization in July 2006 with native multivessel       disease with 3 of 3 patent grafts.  There is an 80% stenosis       distal to the vein graft to the RCA anastomosis.  EF was 60%.      c.     July 2006 negative adenosine Myoview.  The patient was       medically managed.  3. Hypertension.  4. Hyperlipidemia.  5. Diabetes mellitus.  6. GERD.  7. Hypothyroidism.  8. Osteoarthritis/degenerative joint disease.      a.     Status post left total knee of the March 13, 2007.   HISTORY OF PRESENT ILLNESS:  A 77 year old Caucasian female with the  above problems.  She was recently admitted to [facility]  January 14 for left total knee arthroplasty, and we were consulted  secondary to postoperative complaints of discomfort in her chest with  abnormal ECG showing more pronounced ST depression and T-wave inversion  in anterolateral leads.  Her cardiac markers were negative.  We obtained  a 2-D echocardiogram which showed normal LV function without regional  wall motion abnormalities.  It was decided that she would continue with  medical therapy.  Her postop course was further complicated by  postoperative anemia  requiring transfusion as well as SVT.  The  patient's beta blocker dose was eventually titrated to 100 mg b.i.d.,  and then she was discharged January 23.  Looking at her discharge  summary as well as her medications from her rehab facility.  She was not  discharged with beta blocker.   Since discharge she has had recurrent, almost daily tachy palpitations  associated with chest tightness, mild shortness of breath, and nausea.  She has also had minimal appetite with frequent nausea and vomiting  while at rehab.  This, she attributes to taking a lot pain medication  and being constipated.  This morning after recurrent episode of tachy  palpitations.  She asked the nursing staff there to have her seen in the  emergency room.  She  was picked up by EMS, and was noted be in SVT and  apparently treated with adenosine.  When she presented to the ED she was  still going fast, and received some more adenosine, and then broke into  sinus rhythm.  She is currently sinus tach with first-degree AV block.  She has no complaints at this time.   ALLERGIES:  CODEINE.   MEDICATIONS AT REHAB:  1. Morphine p.r.n.  2. Protonix 40 mg b.i.d.  3. Coumadin 2.5 mg daily.  4. Prilosec 20 mg b.i.d.  5. HCTZ 25 mg daily.  6. Lisinopril 40 mg daily.  7. Lipitor 80 mg daily.  8. Actos 15 mg daily.  9. Colace 100 mg b.i.d.  10.Ferrous sulfate 325 mg t.i.d.  11.Synthroid 112 mcg daily.  12.K-Dur 10 mEq b.i.d.  13.Norvasc five daily.  14.Sliding scale insulin.   FAMILY HISTORY:  Mother died at age 37 possibly of an MI.  Father died  at 94 of an MI.  She has a brother who had a history of CAD and CABG,  and another brother who has a history of CAD and PCI, and a sister with  history of CAD and PCI.   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  She is  retired from [employer].  She denies any tobacco,  alcohol,  or drug use.  She is not routinely exercising, although currently she is  taking part in physical therapy for her left knee.   REVIEW OF SYSTEMS:  Positive for tachy palpitations, chest pain,  shortness of breath, nausea, vomiting and left knee pain.  She also has  some left knee swelling and tenderness.   PHYSICAL EXAM:  VITAL SIGNS:  She is afebrile, heart rate currently 109,  respirations 20, blood pressure 119/74, pulse oximetry 100% on 2 liters.  GENERAL:  A pleasant white female in no acute distress.  Awake, alert,  and oriented x3.  NECK:  No bruits JVD.  LUNGS:  Respirations were unlabored.  Clear to auscultation.  CARDIAC:  Regular S1-S2, no S3-S4 with a 2/6 systolic murmur at the  right sternal border.  ABDOMEN:  Round, soft, nontender, nondistended.  Bowel sounds present  x4.  EXTREMITIES:  Warm, dry, pink.  No clubbing, cyanosis or edema.  The  left knee is mildly swollen and tender without erythema.  Dorsalis pedis  and posterior tibial pulses 2+ and equal bilaterally.   Chest x-ray is pending.  EKG currently shows sinus tachycardia with a  first-degree AV block, although previously showed SVT at a rate of 159  beats per minute.  While in SVT she did have more pronounced ST  depression in leads I, II, III, aVF, and V2-V4.   ASSESSMENT/PLAN:  1. Supraventricular tachycardia.  This was noted on previous      admission, and the patient was treated with high-dose beta blocker,      although apparently this had been left off of her medication list      at rehab.  We will go ahead and add that back.  We will plan to      admit and cycle cardiac enzymes as she did have chest pain as a      component of her tachy palpitations.  If enzymes are positive, she      would require cardiac catheterization for further evaluation.  She      does have  fairly more pronounced ST depression while on SVT      compared with when she is in sinus rhythm.  Consider ischemic       evaluation.  The patient is interested in EP evaluation for      possible radiofrequency ablation.  Her electrolytes are okay.  We      will check a TSH and magnesium.  2. Coronary artery disease.  As above, she experienced chest pain with      her supraventricular tachycardia.  Will cycle cardiac markers.      Will add back beta blocker, and otherwise continue statin and ACE      inhibitor.  3. Hypertension stable.  4. Hyperlipidemia.  Continue statin therapy.  5. Status post left total knee arthroplasty.  She is on low-dose      Coumadin.  She does have some swelling and tenderness of the knee.      Will check an ultrasound to rule out bleeding.  6. Gastroesophageal reflux disease.  Continue PPI.  7. Diabetes.  Continue her home regimen.  8. Hypothyroidism.  Continue Synthroid.  Check TSH.      [name], ANP      Jesse Sans. Daleen Squibb, MD, [facility]  Electronically Signed    CB/MEDQ  D:  03/31/2007  T:  04/01/2007  Job:  [id number]

## 2010-07-12 NOTE — Op Note (Signed)
[name], [name]                ACCOUNT NO.:  [id number]   MEDICAL RECORD NO.:  [id number]          PATIENT TYPE:  INP   LOCATION:  3731                         FACILITY:  [facility]   PHYSICIAN:  Doylene Canning. 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   I. INTRODUCTION:  The patient is a 74-year woman who has recently had a  history of recurrent tachy palpitations and documented SVT.  She  underwent recent knee surgery.  In the recovery period she had recurrent  bouts of SVT.  Most recently lasting for several hours.  She was  hospitalized and has had recurrent SVT despite medical therapy, now, for  the last several days.  She is now referred for catheter ablation of her  SVT.   II. PROCEDURE:  After informed consent was obtained, the patient was  taken to the diagnostic EP lab in the fasting state.  After the usual  preparation and draping, intravenous fentanyl and metaxalone was given  for sedation.  A 6-French hexipole catheter was inserted percutaneously  in the right jugular vein and advanced under fluoroscopic guidance to  the coronary sinus.  A 5-French quadripolar catheter was inserted  percutaneously in the right femoral vein, and advanced under  fluoroscopic guidance to the right ventricle.  A 5-French quadripolar  catheter was inserted percutaneously into the right femoral vein and  advanced under fluoroscopic guidance to the His bundle region.  With  catheter insertion, the patient had spontaneously occurring SVT which  terminated with additional connect catheter manipulation.  Initially  rapid ventricular pacing was carried out from the RV apex at a pacing  cycle length of 600 msec and stepwise decreased down to 300 msec where  VA Wenckebach was observed.  During rapid  ventricular pacing the atrial  activation was midline and decremental.  There was no inducible SVT.   Next programmed ventricular stimulation was carried out from the RV apex  to a base drive cycle length of 161 msec.  The S1-S2 interval was  stepwise decreased down to 980 msec  where the retrograde AV node ERP  was observed.  During programmed ventricular stimulation the atrial  activation sequence was midline decremental.   Next programmed atrial stimulation was carried out from the coronary  sinus at a base drive cycle length of 096 msec.  The S1-S2 interval was  stepwise decreased down to 280 msec where the induction of AV node  reentry tachycardia was observed.  This tachycardia was terminated with  CS pacing.  During tachycardia PCs were replaced at the time of His  bundle refractoriness which did not appear outside the atrium, and  pacing from the ventricle demonstrated the VAV activation sequence.   Next rapid atrial pacing was carried out from the coronary sinus, and  stepwise decreased down to 320 msec resulting in the initiation of SVT.  It should be noted that  additional decrements down to 290 msec resulted  in termination of the tachycardia an AV Wenckebach.  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  Mapping of Koch's triangle was carried out.  It was somewhat larger than  usual.  A total of two RF energy applications were delivered to sites 6  and 7 at Koch's triangle resulting in accelerated junctional rhythm.   Following ablation, rapid atrial pacing was carried out demonstrating no  residual slow pathway conduction.  The AV Wenckebach cycle length was  550 msec.  The patient was observed for 30 minutes, and no recurrent  slow pathway conduction. and no inducible SVT.  The  catheters were  removed.  Hemostasis was assured, and the patient was returned to her  room in satisfactory condition.   III. COMPLICATIONS:  There were no immediate procedure complications.   IV. RESULTS:  A.  BASELINE ECG.  Baseline ECG demonstrates sinus rhythm  with normal axis intervals with a first-degree AV block, however.  B.  BASELINE INTERVALS.  The sinus node cycle length was 731 msec.  The  HV interval 39 msec, QRS duration was 100 msec.  C.  RAPID VENTRICULAR PACING.  Rapid atrial pacing was carried out from  the RV apex demonstrating a VA Wenckebach cycle length of 300 msec.  During rapid ventricular pacing the atrial activation sequence was  midline decremental.  D.  VENTRICULAR STIMULATION.  Programmed atrial stimulation was carried  out from the RV apex to the base drive cycle length of 161 msec.  The S1-  S2 interval was stepwise decreased down to 280 msec where the retrograde  AV node ERP was observed.  During programmed ventricular stimulation the  atrial activation sequence was midline decremental.  E.  PROGRAMMED ATRIAL STIMULATION.  Programmed atrial stimulation was  carried out from the coronary sinus and the high right atrium at a base  drive cycle length of 096 msec.  The S1-S2 interval stepwise decreased  down to 280 msec where the AV node ERP was observed.  During programmed  atrial stimulation there were multiple AH jumps and echo beats and  inducible SVT at 500/280.  F.  RAPID ATRIAL PACING.  Rapid atrial pacing was carried out from the  coronary sinus and the high right atrium with a pacing cycle length of  500 msec and stepwise decreased down to 290 msec where AV Wenckebach was  observed.  During rapid atrial pacing at 320 msec there was inducible  SVT.  Following ablation AV Wenckebach cycle length was 550 msec.  G.  ARRHYTHMIAS OBSERVED.  AV node reentry tachycardia initiation was  with rapid atrial pacing, programmed atrial stimulation, and catheter   manipulation.  The duration was sustained.  Termination was with  catheter ablation or pacing from the CS.  H.  MAPPING.  Mapping of Koch's triangle demonstrated to be a little  larger than usual.  1. RF ENERGY APPLICATION.  A total of two RF energy applications were      delivered resulting in accelerated junctional rhythm and rendering      the tachycardia not inducible.   IV. CONCLUSION:  This study demonstrates successful electrophysiology  study and RF catheter ablation of AV node reentry tachycardia with a  total of 2 RF energy applications to sites 6 and 7 in Koch's  triangle  resulting in accelerated junctional rhythm, and rendering the SVT  noninducible.  There was no residual slow pathway conduction.      Doylene Canning. Ladona Ridgel, MD  Electronically Signed     GWT/MEDQ  D:  04/03/2007  T:  04/04/2007  Job:  [id number]   cc:   Pricilla Riffle, MD, [facility]

## 2010-07-15 NOTE — Assessment & Plan Note (Signed)
[facility]                            CARDIOLOGY OFFICE NOTE   NAME:[name], [name]                       MRN:          [id number]  DATE:06/14/2006                            DOB:          1932/07/04    IDENTIFICATION:  [name] is a 77 year old woman, history of CAD  (catheterization July 2006 100% LAD, 75% diagonal, 100% circumflex, 99%  OM, 80% PLSA-LIMA to LAD patent, SVG to OM patent, SVG to distal RCA  with 80% lesion after the PDA).  Myoview negative for ischemia.  The  patient was last seen in September of 2007.   In the interval, she denies chest pain.  Breathing is about the same.  She has problems with her knee, and now painful ankle.   CURRENT MEDICATIONS:  Include:  1. Metoprolol 75 b.i.d.  2. Hydrochlorothiazide 25 daily.  3. Lipitor 80 daily.  4. Synthroid 112 mcg daily.  5. One-A-Day vitamin.  6. Aspirin 81 mg daily.  7. Tylenol p.r.n.  8. Prilosec 20 b.i.d.  9. Norvasc 10 daily.  10.Actos 15 daily.  11.Lisinopril 10 b.i.d.   PHYSICAL EXAMINATION:  Patient is in no distress.  Blood pressure is 149/76.  Pulse is 67.  Weight 174, up from 164 in  September 2007.  LUNGS:  Clear without rales.  CARDIAC EXAM:  Regular rate and rhythm.  S1 and S2.  No S3.  No murmurs.  ABDOMEN:  Benign.  EXTREMITIES:  Lipedema noted.  Left ankle tender.   IMPRESSION:  1. Coronary artery disease.  Appears to be clinically stable.  Would      continue.  2. Hypertension.  Increase Lisinopril.  Keep on other medicines.  She      is reluctant to come off Norvasc since this has helped her with      chest pain.  3. Dyslipidemia.  Last lipid panel was a year ago.  We will need to      check another fasting.  Continue current regimen.  4. Ankle.  Left.  Painful with swelling.  Appears to be more lipedema.      I encouraged her to go see Dr. Lajoyce Corners for this.   I will set followup for a blood pressure check in 2 months on the  increased Lisinopril.   Six months we me.  Again, we will need to check  on her fasting lipids.     Pricilla Riffle, MD, [facility]  Electronically Signed    PVR/MedQ  DD: 06/14/2006  DT: 06/14/2006  Job #: [id number]   cc:   Barry Dienes. Eloise Harman, M.D.

## 2010-07-15 NOTE — Consult Note (Signed)
[facility]  Patient:    [name], [name] Visit Number: [id number] MRN: [id number]          Service Type: EMS Location: [place] Attending Physician:  Ilene Qua Dictated by:   Rollene Rotunda, M.D. [facility] Proc. Date: 06/27/01 Admit Date:  06/27/2001 Discharge Date: 06/27/2001   CC:         Ivery Quale, M.D.  Dietrich Pates, M.D. [facility]   Consultation Report  PRIMARY CARE PHYSICIAN:  Ivery Quale, M.D.  CARDIOLOGIST:  Dietrich Pates, M.D.  REASON FOR CONSULTATION:  Evaluate patient with chest pain.  HISTORY OF PRESENT ILLNESS:  The patient is a 77 year old white female with a past history of coronary disease, status post CABG. She presents today for evaluation of chest discomfort. Says that she has been having chest discomfort for a couple of days. She said this is unlike her previous angina. Last night she had a more severe episode. She was sitting in a chair after eating. She had some discomfort in the left lower chest that was shooting under her left breast. This went on for a couple of hours. She did not quantify it very well. She did not describe radiation to her neck or to her arms. It did improve with antacids. She had quite a bit of belching with this. She said it is very unlike her previous angina. She called our office today and was told to come to the emergency room. She has had no discomfort since this resolved last night. She said that she has been quite active doing such things as mowing the lawn with a push mower last week. With this she does not bring on any symptoms. She has no classic symptoms such as substernal chest pressure. She has no neck discomfort, arm discomfort, activity-induced nausea, vomiting, or excessive diaphoresis. She has had no palpitations, presyncope, or syncope.  PAST MEDICAL HISTORY:  Coronary artery disease, status post CABG x4 (last catheterization October 2002. Left main 30%, LAD occluded, circumflex  occluded in the mid segment, right coronary artery 30% proximal and mid stenosis, 70% distal stenosis, grafts, LIMA to the LAD patent, SVG to OM patent, SVG to PDA patent. EF 60%. She did have a stress perfusion study following this that demonstrated an EF of 71% with no evidence of ischemia.), hypertension, diabetes mellitus, hyperlipidemia, hypothyroidism, allergic rhinitis.  PAST SURGICAL HISTORY:  CABG.  ALLERGIES:  CODEINE causes nausea.  CURRENT MEDICATIONS:  1. Altace 20 mg q.d.  2. Metoprolol 50 mg b.i.d.  3. Hydrochlorothiazide 25 mg q.d.  4. Lipitor 20 mg q.d.  5. Norvasc 10 mg q.d.  6. Synthroid 112 mcg q.d.  7. Avandia 4 mg q.d.  8. Nexium 4 mg q.d.  9. Zyrtec 10 mg q.d. 10. Aspirin 81 mg q.d. 11. Vitamin E.  SOCIAL HISTORY:  The patient does not smoke or drink alcohol.  FAMILY HISTORY:  No early coronary artery disease per old chart.  REVIEW OF SYSTEMS:  As stated in the HPI, negative for other systems.  PHYSICAL EXAMINATION:  GENERAL:  The patient is in no distress.  VITAL SIGNS:  Blood pressure 156/77, afebrile, heart rate 72 and regular.  HEENT:  Eyelids unremarkable, pupils equal, round, and reactive to light, fundi not visualized. Upper dentures, otherwise oral mucosa unremarkable.  NECK:  No jugular venous distention, wave within normal limits, carotids are brisk and symmetrical, there are no bruits, no thyromegaly.  LYMPHATICS:  No cervical, maxillary, inguinal adenopathy.  LUNGS:  Clear to auscultation  bilaterally.  CHEST:  Well-healed sternotomy scar.  HEART:  PMI nondisplaced or sustained, S1 and S2 within normal limits, no S3, no S4, no murmurs.  ABDOMEN:  Flat, positive bowel sounds normal in frequency and pitch, no bruits, rebound, guarding. There are midline pulses, no masses or organomegaly.  SKIN:  No rash, no nodules.  EXTREMITIES:  There is 2+ pulse throughout, no edema.  NEUROLOGIC:  Oriented to person, place, and time.  Cranial nerves II-XII grossly intact. Motor grossly intact.  DIAGNOSTIC DATA:  EKG:  Sinus rhythm, rate 62, axis within normal limits, intervals within normal limits, anterior T wave flattening with slight inversion unchanged from her most recent EKG except for mild shift secondary to lead placement. No objective evidence of ischemia.  Chest x-ray:  Cardiomegaly. No significant change from previous chest x-rays.  LABORATORY DATA:  WBC 7, hemoglobin 13.9, platelets 251, sodium 131, potassium 3.8, BUN 8, creatinine 0.8, CK 116, MB 1.6, troponin 0.04.  ASSESSMENT AND PLAN: 1. Chest discomfort. The patients chest discomfort is very atypical for    angina. She thinks it is similar to previous GI discomfort. She had it last    night briefly. It went away with GI treatment. She has no objective    evidence for ischemia in that her EKG is unchanged and one set of enzymes    is within normal limits. She has had theses similar symptoms that have been    worked up recently with the catheterization and a stress perfusion study.    She was briefly was seen in the emergency room earlier this year for these    same symptoms and was sent home. Based on all of this possibility of this    being a high-grade obstructive coronary lesion resulting in unstable angina    is extremely unlikely. I have discussed this at length with the patient and    her family. She wishes to go home, and I think this is a reasonable thing    to do. She can call us if she has any further problems. We will    establish follow up with Dr. Tenny Craw. She can follow with Dr. Jarold Motto if    these complaints continue, or come back to the emergency room if she has    any severe symptoms. 2. Hypertension. Her blood pressure is slightly elevated today. However, this    can be followed in the office. Medications can be adjusted accordingly.  3. Hyponatremia. She should have a basic metabolic profile done at her next    office appointment.  We will arrange this. 4. Diabetes, per Dr. Jarold Motto. Dictated by:   Rollene Rotunda, M.D. [facility] Attending Physician:  Ilene Qua DD:  06/27/01 TD:  06/29/01 Job: [id number]

## 2010-07-15 NOTE — H&P (Signed)
[name], [name]                            ACCOUNT NO.:  [id number]   MEDICAL RECORD NO.:  [id number]                   PATIENT TYPE:  EMS   LOCATION:  MINO                                 FACILITY:  [facility]   PHYSICIAN:  [place] Bing, M.D.               DATE OF BIRTH:  [birth year]   DATE OF ADMISSION:  05/01/2003  DATE OF DISCHARGE:                                HISTORY & PHYSICAL   REFERRING:  Dr. Celene Kras.   PRIMARY PHYSICIAN:  Dr Barry Dienes. [name].   PRIMARY CARDIOLOGIST:  Dr. Pricilla Riffle.   HISTORY OF PRESENT ILLNESS:  [age]-year-old woman with known coronary  disease, having undergone CABG surgery in 1999, now returning with chest  discomfort.  [name] has generally done well since her surgery.  She was  admitted to [facility] 3 years ago with chest pain and underwent cardiac  catheterization which revealed patent grafts, diffuse distal RCA disease  with poor filling of a posterolateral branch and normal left ventricular  systolic function.  She has multiple cardiovascular risk factors including  hypertension, diabetes, treated with oral agents/diet, and hyperlipidemia.  She also has a history of hypothyroidism requiring replacement therapy.   PAST SURGICAL HISTORY:  Prior surgery has included only an appendectomy.   PAST MEDICAL HISTORY:  She has a history of GERD and underwent an endoscopy  last year.  Apparently, there were no lesions, but she was H. pylori  positive and treated appropriately.  She also has a history of sinusitis.   SOCIAL HISTORY:  Denies the use of tobacco products or alcohol.  Lives alone  in [place].   FAMILY HISTORY:  No prominent coronary disease.   REVIEW OF SYSTEMS:  Intermittent epigastric discomfort relieved with  antacids; stable weight and appetite.  All other systems reviewed and are  negative.   PHYSICAL EXAMINATION:  GENERAL:  On exam, pleasant woman with pallor, in no  acute distress.  VITAL SIGNS:  The  temperature is 98.  Heart rate 90 and regular.  Respirations 20.  Blood pressure 170/70.  O2 saturation 100% on room air.  HEENT:  Anicteric sclerae.  NECK:  Minimal jugular venous distention; normal carotid upstrokes without  bruits.  HEMATOPOIETIC:  No adenopathy.  ENDOCRINE:  No thyromegaly.  SKIN:  No significant lesions.  LUNGS:  Clear.  CARDIAC:  Normal first and second heart sounds; prominent fourth heart  sounds; minimal early systolic ejection murmur.  ABDOMEN:  Soft and nontender; no organomegaly; no bruits; normal bowel  sounds.  EXTREMITIES:  Normal distal pulses; no edema.  NEUROMUSCULAR:  Symmetric strength and tone.  MUSCULOSKELETAL:  No joint deformities.   LABORATORY AND ACCESSORY CLINICAL DATA:  Chest x-ray:  Cardiomegaly; clear  lung fields.   EKG:  Normal sinus rhythm; first-degree A-V block; nonspecific ST  abnormalities.   Initial cardiac markers are negative.  Initial CBC and chemistry profile are  normal.   IMPRESSION:  [name] presents with recurrent chest discomfort that is not  terribly different from previous symptoms attributed to gastrointestinal  disease.  She did have some initial relief with antacids.  She will be  placed on observation status to obtain serial cardiac markers and EKGs.  Her  proton pump inhibitor therapy will be changed to b.i.d. dosing.  We will  reassess diabetic control with lipids.  Parenteral therapy will be withheld,  unless she develops increasing symptoms.  If initial testing is negative, an  outpatient Cardiolite study is anticipated.  She will be given sublingual  nitroglycerin to use at home.                                                 Bing, M.D.    RR/MEDQ  D:  05/01/2003  T:  05/02/2003  Job:  [id number]

## 2010-07-15 NOTE — H&P (Signed)
NAMEARY, RUDNICK NO.:  [id number]   MEDICAL RECORD NO.:  [id number]          PATIENT TYPE:  INP   LOCATION:  1828                         FACILITY:  [facility]   PHYSICIAN:  Jonelle Sidle, M.D. LHCDATE OF BIRTH:  April 12, 1932   DATE OF ADMISSION:  06/14/2004  DATE OF DISCHARGE:                                HISTORY & PHYSICAL   CARDIOLOGIST:  Pricilla Riffle, M.D.   CHIEF COMPLAINT:  Chest discomfort and hot feeling in head with diaphoresis,  occasional palpitations.   HISTORY OF PRESENT ILLNESS:  [name] is a 77 year old woman followed by  Dr. Tenny Craw in our clinic with a known history of coronary artery disease,  status post coronary artery bypass grafting in 1999 with patent bypass  grafts on angiography in 2002 and a negative myocardial perfusion study in  March 2005.  She presented to the emergency department on April 18, stating  that she developed sudden onset feeling of heat in her head associated  with diaphoresis as well as a dull ache in her upper anterior chest.  This  occurred after she was doing some ironing.  She states that this lasted for  several minutes and was moderate in intensity.  During evaluation in the  emergency department, symptoms subside spontaneously.  She also mentions  that she has had some intermittent left arm discomfort, particularly over  the last month since a fall on some stairs.  This is not specifically  exertional, however.  She states that her symptoms were not entirely similar  to previous problems with angina which she describes more as being a sharp  pain in both sides of her chest around the time of her bypass surgery.  Her  electrocardiogram does show some slight ST segment depression in the  anterolateral leads, although this has been noted on previous tracings in  May 2003.  Initial point-of-care cardiac markers are normal with a troponin-  I level less than 0.05 and a CK-MB less than 1.1.  Complicating matters  is a  history of gastroesophageal reflux disease for which she has been seen  formally by gastroenterology and is on a proton pump inhibitor.  She states  that she is bothered by significant indigestion and actually today also had  some belching associated with her symptoms.   ALLERGIES:  CODEINE.   MEDICATIONS AT HOME:  1.  Altace 10 mg p.o. b.i.d.  2.  Lopressor 75 mg p.o. b.i.d.  3.  Hydrochlorothiazide 25 mg p.o. daily.  4.  Lipitor 80 mg p.o. daily.  5.  Norvasc 10 mg p.o. daily.  6.  Synthroid 112 mcg p.o. daily.  7.  Avandia 4 mg p.o. daily.  8.  Protonix 40 mg p.o. daily.  9.  Multivitamin one p.o. daily.  10. Enteric-coated aspirin 81 mg p.o. daily.  11. Calcium supplement 600 mg p.o. b.i.d.   PAST MEDICAL HISTORY:  1.  Coronary artery disease, status post coronary artery bypass grafting in      1999 with patent bypass graft at angiography in 2002.  Most recent  Cardiolite in March 2005 showed no evidence of ischemia.  2.  Gastroesophageal reflux disease, on proton pump inhibitor.  3.  Diabetes mellitus.  4.  Hypertension.  5.  Dyslipidemia on hydrostatin therapy.  6.  Hypothyroidism on Synthroid.   SOCIAL HISTORY:  The patient lives in [place].  She does have family in  the area.  A sister accompanied her to the emergency department today.  She  denies any significant tobacco or alcohol use history.   FAMILY HISTORY:  Noncontributory for premature cardiovascular disease.   REVIEW OF SYSTEMS:  As described in history of present illness.  She also  complains of some intermittent bilateral leg pain at times, although not  clearly claudication.  She has had no significant shortness of breath,  hemoptysis, cough, fevers, chills, melena, hematochezia, changes in appetite  or bowel habits.  Review of systems otherwise negative.   PHYSICAL EXAMINATION:  VITAL SIGNS: The patient is afebrile.  Blood pressure  138/75, heart rate in the 70's and sinus rhythm.  The  patient is not hypoxic  on 2 L nasal cannula.  GENERAL:  This is an overweight woman, lying in bed in no acute distress.  HEENT: Conjunctivae and lids are normal.  Oropharynx is clear.  NECK:  Supple without elevated jugular venous pressure.  No carotid bruits  or thyromegaly is noted.  LUNGS:  Clear to auscultation bilaterally with nonlabored breathing at rest.  CARDIAC:  Regular rate and rhythm with a 2/6 systolic murmur heard best at  the right base without radiation to the carotid.  Second heart sound was  preserved.  There was no pericardial rub noted.  ABDOMEN:  Soft, nontender with normoactive bowel sounds.  EXTREMITIES:  No pitting edema.  Peripheral pulses are 2+.  SKIN:  No ulcerative changes.  MUSCULOSKELETAL:  __________ .  PSYCHIATRIC:  The patient alert and oriented x3.   LABORATORY DATA:  Repeat troponin-I point-of-care markers less than 0.05  with a peak CK-MB of 1.1.  Sodium 129, potassium 3.8, chloride 93, bicarb  27, glucose 118, BUN 10, creatinine 0.9.  INR is 0.9.  WBC 8.3, hemoglobin  13.2, hematocrit 36.9, platelets 186.  Chest x-ray shows no acute changes  with decreased inspiratory effort.   IMPRESSION:  1.  Recent presentation with upper chest discomfort described as a dull      ache, also associated with a feeling of warmth in the face and      diaphoresis.  Symptoms lasted for a few minutes and were different in      character from previous angina symptoms.  Initial point-of-care cardiac      markers were normal.  An electrocardiogram, although showing some      anterolateral ST segment changes, is similar to previous tracings from      2003.  She has also had some intermittent left arm discomfort although      not specifically exertional.  Complicating history is an apparent      significant problem with gastroesophageal reflux disease which she has      also experienced today. 2.  Known coronary artery disease, status post previous coronary artery       bypass grafting in 1999 with patent bypass grafts at angiography in 2002      and negative ischemic testing via Cardiolite in March 2005.  3.  Diabetes mellitus.  4.  Dyslipidemia.  5.  Hypertension.   PLAN:  1.  The patient will be admitted for observation.  Will  continue to cycle      cardiac markers.  She will be placed on Lovenox for the time being.  2.  Continue home medications.  3.  If enzymes are negative, could give some consideration to an outpatient      Myoview. She has also expressed some symptoms of palpitations recently,      and if nothing is identified on telemetry, could give consideration for      outpatient event recorder as well.  4.  Otherwise if symptoms worsen or cardiac markers are abnormal, consider      invasive coronary angiography.      SGM/MEDQ  D:  06/15/2004  T:  06/15/2004  Job:  [id number]

## 2010-07-15 NOTE — Discharge Summary (Signed)
[name], [name]                ACCOUNT NO.:  [id number]   MEDICAL RECORD NO.:  [id number]          PATIENT TYPE:  INP   LOCATION:  3707                         FACILITY:  [facility]   PHYSICIAN:  Charlton Haws, M.D.     DATE OF BIRTH:  1933-01-05   DATE OF ADMISSION:  09/22/2004  DATE OF DISCHARGE:  09/24/2004                                 DISCHARGE SUMMARY   PRIMARY CARE PHYSICIAN:  Barry Dienes. Eloise Harman, M.D.   C CARDIOLOGIST:  Pricilla Riffle, M.D.   ALLERGIES:  CODEINE.   PROCEDURES PERFORMED DURING THIS HOSPITALIZATION:  Cardiac catheterization,  left heart cath, _______________, SLBG, SVG x2, SLIMA.   PRINCIPAL DIAGNOSIS:  The patient was admitted on September 22, 2004, for chest  pain and presyncope.   SECONDARY DIAGNOSES:  1.  The patient has diabetes mellitus, on oral agents.  2.  Status post gallbladder surgery last year by Dr. Tawanna Cooler.  3.  Continued gastroesophageal reflux disease and reflux.   HISTORY OF PRESENT ILLNESS:  Ms. Copland is a 77 year old patient of Dr.  Eloise Harman and Dr. Dietrich Pates who has known coronary artery disease with a  CABG in 1999.  She also had a cardiac catheterization in 2002.  Her grafts  at that time were patent, but she has an unprotected _______________and  posterolateral system which neither one of those had filled retrograde from  the graft.  The patient had presented with presyncope and chest pain and  previous medical therapy was tried.  Since that time, she has had continued  problems with GERD and reflux and had run out of her Protonix approximately  two days prior to being admitted.  She has had some continued substernal  chest pressure which is difficult to determine if it is GI or cardiac.  The  patient had GI overtones with belching.  She was admitted to rule out  cardiac versus GI symptoms.   HOSPITAL COURSE:  The patient was admitted on September 22, 2004, with a planned  cath on September 24, 2004.  The patient was started on heparin IV drip and  had  catheterization done on September 23, 2004.  Cardiac catheterization showed a  patent IMA to LAD, patent SVG to OM, patent SVG to PDA, 80% stenosis in  distal RCA leading to an ungrafted PDA which was unchanged from 2000 and  2002.  It was decided to treat the patient with medical therapy and consider  PCI in future if increased ischemia.   DISCHARGE LABORATORY DATA:  White blood cell count of 6.8, hemoglobin of 12,  hematocrit of 34.5, platelet count of 184.  Sodium 133, potassium 3.6,  chloride of 100, CO2 of 25, BUN of 6, creatinine of 0.8, glucose of 101.  CK-  MB and troponin's were all negative.  Cholesterol was 136, LDL 71, HDL 50,  triglycerides 73.   FOLLOWUP PLANS:  An Adenosine Myoview that is scheduled for September 30, 2004,  at 9:45, and a follow up appointment with Dr. Tenny Craw in the [facility] on  October 05, 2004, at 10:45.  At that point, a  groin check will be done.   DISCHARGE MEDICATIONS:  1.  Atorvastatin 80 mg p.o. at 1800.  2.  Levothyroxine 112 mcg.  3.  Rosiglitazone 4 mg p.o. daily.  4.  Clopidogrel 75 mg p.o. daily.  5.  Aspirin 325 mg p.o. daily.  6.  Ramipril 10 mg p.o. daily.  7.  Lopressor 75 mg p.o. b.i.d.  8.  Hydrochlorothiazide 25 mg p.o. daily.  9.  Protonix 40 mg p.o. daily w.c.  10. Nitroglycerin 0.4 mg sublingual p.r.n.   OUTSTANDING LABS OR STUDIES:  The patient needs to have a groin check in two  weeks with the PA.       AH/MEDQ  D:  09/24/2004  T:  09/24/2004  Job:  [id number]

## 2010-07-15 NOTE — H&P (Signed)
NAMEENGLAND, GREB                ACCOUNT NO.:  [id number]   MEDICAL RECORD NO.:  [id number]          PATIENT TYPE:  INP   LOCATION:  1833                         FACILITY:  [facility]   PHYSICIAN:  Charlton Haws, M.D.     DATE OF BIRTH:  09-05-1932   DATE OF ADMISSION:  09/22/2004  DATE OF DISCHARGE:                                HISTORY & PHYSICAL   REASON FOR ADMISSION:  Admission for unstable angina.   HISTORY OF PRESENT ILLNESS:  [name] is a 77 year old patient of Dr. Dossie Arbour and Dr. Dietrich Pates.  She has known coronary artery disease with  CABG in 1999.  She had a cath by myself in 2002; her grafts were patent, but  she had an unprotected first diagonal branch and posterolateral system;  neither one of these filled retrograde from the grafts.  The patient at that  time had presented primarily with presyncope and was not having a lot of  chest pain; medical therapy was tried.  She had a nonischemic Cardiolite  then and again in 2005.   Since that time, she has had continued problems with GERD and reflux.  She  ran out of her Protonix a day or 2 ago.  She has had substernal chest  pressure; it is difficult to tell whether it is GI or not.  There are some  GI overtones with belching; however, she did get some relief with nitro.   The patient is also status post gallbladder surgery last year by Dr. Carolynne Edouard.   She sees Dr. Eloise Harman for her general medical needs.  She tells me her  diabetes has been reasonably well-controlled on Avandia.   REVIEW OF SYSTEMS:  Review of systems is otherwise remarkable for some  fatigue in the heat, but she still gets out and mows her grass.  She has  never been married.  She does not have children.  She is retired from [employer] here in [place] after 39 years of work.   She is a nonsmoker.   ALLERGIES:  She is allergic to CODEINE.   MEDICATIONS:  Her medications are listed on her admission sheet; they  include:  1.  Altace 10  mg a day.  2.  Lopressor 75 mg b.i.d.  3.  Hydrochlorothiazide 25 mg a day.  4.  Lipitor 80 mg a day.  5.  Norvasc 10 mg a day.  6.  Synthroid 112 mcg a day.  7.  Avandia 4 mg a day.  8.  Protonix 40 mg a day.   PHYSICAL EXAMINATION:  VITAL SIGNS:  On exam, blood pressure is 130/70.  Pulse is 58 and regular.  LUNGS:  Clear.  CARDIAC:  Carotids are normal.  There is a systolic ejection murmur.  ABDOMEN:  Status post cholecystectomy.  It is benign.  There is no  epigastric pain.  EXTREMITIES:  Distal pulses are intact with no edema.   LABORATORY AND ACCESSORY CLINICAL DATA:  EKG shows T wave inversions in V1,  2 and 3.   Chest x-ray is pending, as is lab work.  IMPRESSION:  [name] is 77 years post catheterization, which at that time  showed an unprotected diagonal and posterolateral artery system.  She is a  diabetic with symptoms that are difficult to distinguish between  gastrointestinal and cardiac.  She has T wave inversions in leads V1, 2 and  3.  Given this constellation of findings, I think the best approach for risk  stratification would be to proceed with diagnostic coronary arteriography in  the morning.   The patient will be given 150 mg of Plavix.  She will be started on O2.  She  did take aspirin today before coming to the operating room.  She will be  placed on heparin protocol.   We will hold her oral hypoglycemic in the morning.   The patient will then have her diagnostic catheterization in the morning.   It has been 75 years since her catheterization in 2002 and there very well  could be progression of her disease as well as new graft disease.   If her cardiac workup is negative, she should probably see Gastroenterology  again and proceed with upper endoscopy, and she was encouraged to get over-  the-counter Prilosec if she cannot afford her Protonix.   Further recommendations will be based on the results of her catheterization.  Her sister was with  her at the time and they both agree to the above plan.  The risks of catheterization including bleeding, stroke, need for emergency  surgery and dye reaction were discussed.  The patient did have a little  extra bleeding after her last catheterization and was somewhat concerned  about this.  I explained to her that we are using smaller catheters than  2002 and hopefully this would not be the case again.       PN/MEDQ  D:  09/23/2004  T:  09/23/2004  Job:  [id number]

## 2010-07-15 NOTE — Cardiovascular Report (Signed)
[place]  Patient:    [name], [name] Visit Number: [id number] MRN: [id number]          Service Type: MED Location: [phone] Attending Physician:  Pricilla Riffle Dictated by:   Noralyn Pick Eden Emms, M.D. [facility] Proc. Date: 12/11/00 Admit Date:  12/10/2000   CC:         Dr. Glennis Brink C. Eden Emms, M.D. [facility]   Cardiac Catheterization  INDICATIONS:  Presyncope.  PreCABG.  RESULTS:  Angiographic data: 1. Left main coronary artery had a 30% distal stenosis. 2. Left anterior descending artery was subtotally occluded in the mid-vessel    with competitive flow from the LIMA and there was an eccentric 70%    lesion before the takeoff of the first diagonal branch.  However, the LIMA    did fill diagonal branches retrograde up to the left main. 3. Circumflex coronary artery was subtotally occluded in the mid-vessel with    faint competitive flow to the OM branch. 4. Right coronary artery had 30% multiple discreet lesion in the proximal.    In the midportion, there was a 70% distal lesion with diffuse disease    in the posterolateral branches. 5. Left internal mammary artery was widely patent to the mid-LAD.  It filled    both antegrade and retrograde. 6. Saphenous vein graft to the OM was widely patent. 7. Saphenous vein graft was widely patent but supplied only a small PDA    with no retrograde filling of the PLA.  RAO ventriculography:  RAO ventriculography was normal with hyperdynamic LV function and an EF in the 70% range.  There was no gradient across the aortic valve and no MR.  LV pressure was 157/20.  Aortic pressure was 156/64.  IMPRESSION:  The films were reviewed with Dr. Loraine Leriche Pulsipher.  Particularly since the patient has not been having a lot of chest pain, we did not think the diagonal branch or posterolateral system was under perfused.  We certainly could angioplasty the distal native right coronary artery or the native diagonal  branch.  However, since the patients symptoms have primarily been presyncope, she will have an Adenosine-Cardiolite tomorrow to further assess for ischemia either in the diagonal or posterolateral distribution.  If either of these show up, she will return to the laboratory for angioplasty.  Otherwise, she will be treated medically with further workup of possible arrhythmias. Dictated by:   Noralyn Pick Eden Emms, M.D. [facility] Attending Physician:  Dietrich Pates V DD:  12/11/00 TD:  12/11/00 Job: [id number]

## 2010-07-15 NOTE — Discharge Summary (Signed)
[place]  Patient:    [name], [name] Visit Number: [id number] MRN: [id number]          Service Type: MED Location: [phone] Attending Physician:  Pricilla Riffle Dictated by:   Tereso Newcomer, P.A. Admit Date:  12/10/2000 Discharge Date: 12/12/2000   CC:         Barry Dienes. Eloise Harman, M.D.   Discharge Summary  DATE OF BIRTH:  August 30, 1932  REASON FOR ADMISSION:  Unstable angina and presyncope.  DISCHARGE DIAGNOSES: 1. Chest pain, etiology entirely unclear. 2. Presyncope, etiology entirely unclear.  No arrhythmias noted on telemetry    this admission. 3. Coronary artery disease. 4. Status post coronary artery bypass grafting in 1999, with remnant left    anterior descending, saphenous vein graft to obtuse marginal, saphenous    vein graft to posterior descending artery. 5. Diabetes mellitus type 2. 6. Hypertension. 7. Hyperlipidemia. 8. Allergic rhinitis.  PROCEDURES:  Cardiac catheterization by Dr. Charlton Haws on December 11, 2000, revealing left main 30% distal stenosis, LAD subtotally occluded in mid vessel with competitive flow from the LIMA, and an eccentric 70% lesion before the takeoff of the first diagonal branch.  The LIMA did fill diagonal branches retrograde up to the left main.  Circumflex was subtotally occluded in the mid vessel with faint competitive flow to the OM branch.  RCA with 30% multiple discrete lesions in the proximal portion.  A 70% distal lesion with diffuse disease in the posterolateral branches.  LIMA to LAD patent.  SVG to OM patent.  SVG to PDA patent, with no retrograde filling of the PLA.  LV-gram revealed an EF of 70%.  HISTORY OF PRESENT ILLNESS:  This 77 year old female was admitted to the [facility] through the emergency room on December 10, 2000.  She presented complaining of chest tightness and feelings of presyncope.  She had noted some dizzy spells for the past two to three weeks.  On  the morning of admission she had the onset of swimmy-headed feeling but did not have frank syncope.  During the episode she had chest pain that lasted approximately 10 minutes.  PHYSICAL EXAMINATION:  NECK:  Without JVD.  CHEST:  Clear to auscultation.  HEART:  Regular rate and rhythm.  Normal S1, S2.  ABDOMEN:  Soft and nontender.  EXTREMITIES:  Without clubbing, cyanosis, or edema.  LABORATORY DATA:  EKG revealed sinus rhythm, heart rate 74, first-degree AV block, T-wave inversions in V1 through 3, and mild ST depression in lead I and V4 through 6.  Chest x-ray revealed cardiomegaly, post-CABG changes, and no acute disease.  HOSPITAL COURSE:  The patient was seen by Dr. Glennon Hamilton, and he felt her pain was probably secondary to coronary insufficiency.  Repeat EKG in the emergency room had resolution of the changes noted above.  The patient was admitted to the hospital, placed on Lovenox as well as her regular home medications.  It was felt she needed coronary angiography to further evaluate her symptoms.  She underwent cardiac catheterization on December 11, 2000, with Dr. Eden Emms.  The results are noted above.  She tolerated the procedure well and had no immediate complications.  Dr. Eden Emms reviewed the films with Dr. Gerri Spore.  He noted that since the patient had not been having a lot of chest pain it was not felt that the diagonal branch of posterolateral system was underperfused.  It was felt that the distal native right or the native diagonal  branch could certainly be angioplastied.  However, since the patients symptoms were primarily presyncope, it was decided to evaluate her with an adenosine Cardiolite to further assess for ischemia, either in the diagonal or posterolateral distribution.  If either of these areas showed ischemia, then the patient would return to the laboratory for angioplasty. She underwent adenosine Cardiolite testing on December 12, 2000.  The  images revealed no ischemia or infarct, with EF of 71%.  Therefore, it was felt she was stable enough for discharge to home.  Dr. Chales Abrahams saw the patient prior to discharge.  She would need follow-up with a 30-day event monitor, and she would see Dr. Tenny Craw to follow up on that.  She could follow up with her regular primary care physician in two weeks for groin check.  LABORATORY DATA:  White blood cell count 7900, hemoglobin 12.5, hematocrit 35.9, platelet count 195,000.  INR 1.0.  Sodium 135, potassium 3.5, chloride 100, CO2 28, glucose 123, BUN 11, creatinine 0.8, total bilirubin 0.9, alkaline phosphatase 58, SGOT 21, SGPT 16, total protein 6.7, albumin 3.6, calcium 9.3.  Cardiac enzymes negative x 3.  DISCHARGE MEDICATIONS:  1. Zyrtec.  2. Nexium 40 mg a day.  3. Altace 20 mg q.d.  4. HCTZ 25 mg q.d.  5. Lipitor 20 mg q.h.s.  6. Metoprolol 50 mg b.i.d.  7. Avandia 4 mg q.d.  8. Synthroid 0.112 mg q.d.  9. Aspirin 325 mg q.d. 10. Nitroglycerin p.r.n. chest pain.  ACTIVITY:  No driving, heavy lifting, exertion, or work for three days.  DIET:  Low-fat, low-sodium, diabetic.  WOUND CARE:  She is to call the office for any groin swelling, bleeding, or bruising.  FOLLOW-UP:  She will be contacted by our office for a 30-day event monitor. She will also need to see Dr. Tenny Craw in six to eight weeks, and she will be contacted by the office for that appointment as well.  Dictated by:   Tereso Newcomer, P.A. Attending Physician:  Pricilla Riffle DD:  12/12/00 TD:  12/12/00 Job: [id number]

## 2010-07-15 NOTE — Cardiovascular Report (Signed)
NAMECASIMIRA, SUTPHIN                ACCOUNT NO.:  [id number]   MEDICAL RECORD NO.:  [id number]          PATIENT TYPE:  INP   LOCATION:  3707                         FACILITY:  [facility]   PHYSICIAN:  Arturo Morton. Riley Kill, M.D. [facility] OF BIRTH:  07-29-1932   DATE OF PROCEDURE:  09/23/2004  DATE OF DISCHARGE:                              CARDIAC CATHETERIZATION   PROCEDURE:  Cardiac catheterization   INDICATIONS:  Ms. Atayde is a 77 year old who has previously undergone  revascularization surgery. She presented with some epigastric discomfort.  She has had previous admissions and her last catheterization in 2002, her  grafts were widely patent but she had a distal right coronary stenosis. This  was not treated. She continues to remain generally in pretty good shape.  Following her admission, further catheterization was recommended.   PROCEDURE:  1.  Left heart catheterization.  2.  Selective coronary arteriography.  3.  Selective left ventriculography.  4.  Saphenous vein graft angiography x2.  5.  Selective left internal mammary angiography.   DESCRIPTION OF PROCEDURE:  The patient was brought to the catheterization  laboratory, prepped and draped in the usual fashion. Through an anterior  puncture, the right femoral artery was easily entered, a 6-French sheath was  placed. Views of the left and right coronary arteries were obtained in  multiple angiographic projections. Following this, vein graft angiography of  the right coronary was obtained using an RCA catheter. A left bypass was  used to engage the left bypass and internal mammary was required to engage  the internal mammary. Central aortic and left ventricular pressures were  measured with the pigtail. Ventriculography was performed in the RAO  projection. Following this, I reviewed the films and compared them to 2000  and 2002. We elected to recommend medical therapy at that point with an  adenosine Cardiolite to assess the  right coronary territory. There were no  complications.   HEMODYNAMIC DATA:  1.  Central aortic pressure 158/69, mean 107.  2.  Left ventricular pressure 148/17.  3.  No gradient on pullback across aortic valve.   ANGIOGRAPHIC DATA:  1.  The left main coronary artery had some calcium at the ostium and some      mild luminal irregularity but it is free of critical disease.  2.  The left anterior descending artery demonstrates about a 30% narrowing      at the origin of the septal perforator. Beyond this, it then supplies      two diagonals. There is diffuse 75% narrowing between the origin of the      diagonals. There is competitive filling of the distal vessel.  3.  The internal mammary to the distal LAD is widely patent.  4.  The circumflex provides a first marginal branch that is totally      occluded. There is an AV circumflex that is subtotally occluded but is      relatively small distally.  5.  There is a large saphenous vein graft to the OM. The OM beyond the graft      insertion bifurcates with a  superior sub branch that has perhaps about      50% ostial narrowing.  6.  The right coronary artery demonstrates about 40% narrowing in the      ostium. The ostium then opens up and there is some luminal irregularity      throughout the rest of the vessel. There is diffuse segmental plaquing      in the entire distal portion of the mid vessel and proximal portion of      the distal vessel. Just after the takeoff of the PDA location, there is      a focal stenosis of about 75 to 80%. When compared to previous studies,      this appears to be largely unchanged. This supplies the posterolateral      segment which is a fairly large caliber segment. We carefully compared      the films to 2000 and 2002 and again this area did not appear      dramatically changed.   The saphenous vein graft to the PDA is small in caliber and has diffuse 30%  narrowing in its mid portion. The PDA fills  relatively well. There is  perhaps mild plaquing leading into the area just beyond the graft insertion.   Ventriculography in the RAO projection revealed systolic ejection fraction  of likely greater than 60%. There is mild inferior wall hypokinesis.   CONCLUSIONS:  1.  Patent internal mammary to left anterior descending.  2.  Patent saphenous vein graft to the obtuse marginal.  3.  Patent saphenous vein graft to the posterior descending artery.  4.  80% distal the stenosis in the continuation branch of the right coronary      artery distally which does not appear to be substantially changed      compared to the studies 2000 and 2002.   RECOMMENDATIONS:  1.  Continued medical therapy.  2.  Adenosine Cardiolite. If significantly positive, would consider doing a      PTCA and stent of the distal right coronary artery; if negative, would      continue medical therapy. A previous Cardiolite has been obtained in      March of 2005 and did not show significant inferior ischemia. We will      arrange for an adenosine Cardiolite.       TDS/MEDQ  D:  09/23/2004  T:  09/23/2004  Job:  [id number]   cc:   CV Laboratory   Pricilla Riffle, M.D.

## 2010-07-15 NOTE — Discharge Summary (Signed)
NAMEARMYA, WESTERHOFF                ACCOUNT NO.:  [id number]   MEDICAL RECORD NO.:  [id number]          PATIENT TYPE:  INP   LOCATION:  4707                         FACILITY:  [facility]   PHYSICIAN:  Pricilla Riffle, M.D.    DATE OF BIRTH:  06-Jan-1933   DATE OF ADMISSION:  06/14/2004  DATE OF DISCHARGE:  06/16/2004                                 DISCHARGE SUMMARY   PRIMARY CARE PHYSICIAN:  Dr. Jarome Matin   CARDIOLOGIST:  Dr. Dietrich Pates   GASTROINTESTINAL:  Dr. Russella Dar   DISCHARGE DIAGNOSES:  Chest pain, negative cardiac enzymes.   PAST MEDICAL HISTORY:  1.  Coronary artery disease status post coronary artery bypass graft in      1999.  Patent grafts in 2002.  Cardiolite scan March 2005 showing normal      perfusion.  2.  Gastroesophageal reflux disease.  3.  Type 2 diabetes.  4.  Hypertension.  5.  Dyslipidemia.  6.  Hypothyroidism on medication.   DISPOSITION:  Patient being discharged home with instructions to increase  her Protonix 40 mg to p.o. b.i.d. and resume all previous medications  including Altace, Lopressor, hydrochlorothiazide, Lipitor, Norvasc,  Synthroid, Avandia, multivitamin, enteric-coated aspirin, and calcium  supplement.  She is to follow up with Dr. Russella Dar.  Patient will call and  schedule appointment and then follow up with Dr. Tenny Craw in around two weeks.   HOSPITAL COURSE:  This is a 77 year old female followed by Dr. Tenny Craw with  known history of CAD status post bypass who presented to the emergency room  on date of admission complaining of diaphoresis as well as a dull ache in  her upper anterior chest.  Initial point of care markers were normal with a  troponin less than 0.05, CK-MB less than 1.1.  Patient has a known history  of gastroesophageal reflux disease.  Patient also states she had been  bothered by significant indigestion and actually today she has some belching  associated with her symptoms.  It was decided with patient's past medical  history  to admit for observation, cycle cardiac enzymes.  Patient was also  placed on  Lovenox prophylactically.  Cardiac enzymes have been negative.  Blood work  otherwise within normal limits.  Dr. Dietrich Pates in to see patient on day of  discharge.  Patient without further complaints of discomfort.  The plan is  to discharge patient home and follow up outpatient.  Will hold on empiric  Cardiolite testing for now.      MB/MEDQ  D:  06/16/2004  T:  06/16/2004  Job:  [id number]   cc:   Barry Dienes. Eloise Harman, M.D.  [address]  [place]  [place] [address]  Fax: [phone]   Venita Lick. Russella Dar, M.D. [facility]

## 2010-07-15 NOTE — Assessment & Plan Note (Signed)
[facility]                              CARDIOLOGY OFFICE NOTE   NAME:[name], [name]                       MRN:          [id number]  DATE:11/02/2005                            DOB:          [birth year]    IDENTIFICATION:  The patient is a 77 year old woman with a history of CAD  status post catheterization July 2006 (LAD 100%, 75% diagonal, 100%  circumflex, 99% OM; 80% PLSA).  LIMA to LAD patent, SVG to circumflex  patent, OM was not bypassed; distal RCA with 80% lesion after PDA.  Myoview  scan with no ischemia.  Continued medical therapy.   She was last seen in clinic back in March.   She notes occasional chest pain after eating.  She feels like food is  hanging up.  She also questions if she has some reflux.  Note, she has been  seen by Claudette Head in the past, has not had any inflammation noted.  No  dilation was made in the past.   She denies shortness of breath.   CURRENT MEDICATIONS:  Altace 10 b.i.d., metoprolol 75 b.i.d.,  hydrochlorothiazide 25 daily, Lipitor 80 daily, Synthroid 112 mcg daily,  Avandia 4 daily, One-A-Day vitamin daily, aspirin 81 mg daily, Tylenol  p.r.n., Caltrate plus D 600 b.i.d., Prilosec b.i.d., and Norvasc 10 daily.   PHYSICAL EXAMINATION:  GENERAL:  Patient is in no distress.  VITAL SIGNS:  Blood pressure 118/74.  Pulse is 60 and regular.  Weight 164.  LUNGS:  Clear.  CARDIAC EXAM:  Regular rate and rhythm.  S1, S2.  No S3.  No significant  murmurs.  ABDOMEN:  Benign.  EXTREMITIES:  No edema.   Twelve-lead EKG:  Normal sinus rhythm, 60 beats per minute.  First-degree AV  block.  Slight ST depression with biphasic T waves V2 through V6, old.  Slight sagging of the ST in I and II, again old.   IMPRESSION:  1. Coronary artery disease, clinically unchanged.  I think the episodes of      chest discomfort do not seem to be cardiac, may indeed be      gastrointestinal as they are so erratic and related to  food.  Would      continue on current regimen.  2. Dyslipidemia.  A fasting lipid panel through Dr. Silvano Rusk office:      LDL is 89, HDL is 58.  Would continue.  3. Diabetes, on Avandia, is concerned about this.  I told her to talk      things over with her primary, Dr. Eloise Harman.  She did not tolerate      Glucophage.  I would not change for now, but again, will need to be      addressed.  There is some increased risk, but again, weighing the risks      and benefits of what she tolerates from glucose control standpoint.   I will set followup for the spring, sooner if problems develop.  Pricilla Riffle, MD, [facility]    PVR/MedQ  DD:  11/02/2005  DT:  11/02/2005  Job #:  [id number]   cc:   Barry Dienes. Eloise Harman, M.D.

## 2010-07-15 NOTE — Op Note (Signed)
[name], DIVEN                            ACCOUNT NO.:  [id number]   MEDICAL RECORD NO.:  [id number]                   PATIENT TYPE:  OIB   LOCATION:  5738                                 FACILITY:  [facility]   PHYSICIAN:  Ollen Gross. Vernell Morgans, M.D.              DATE OF BIRTH:  May 23, 1932   DATE OF PROCEDURE:  07/28/2003  DATE OF DISCHARGE:                                 OPERATIVE REPORT   PREOPERATIVE DIAGNOSIS:  Gallstones.   POSTOPERATIVE DIAGNOSIS:  Gallstones.   PROCEDURE:  Laparoscopic cholecystectomy with intraoperative cholangiogram.   SURGEON:  Ollen Gross. Carolynne Edouard, M.D.   ASSISTANT:  Lebron Conners, M.D.   ANESTHESIA:  General endotracheal anesthesia.   DESCRIPTION OF PROCEDURE:  After informed consent was obtained the patient  was brought to the operating room and placed in the supine position on the  operative table.  After adequate general anesthesia, the patient's abdomen  was prepped with Betadine and draped in the usual sterile manner.  The area  below the umbilicus was infiltrated with 1/4% Marcaine and a small incision  was made with a 15 blade knife.  The incision was carried down through the  subcutaneous tissue bluntly with a Kelly clamp and [employer] retractors  until the linea alba was identified.  The linea alba was incised with a 15  blade knife and each side was grasped with Kocher clamps and elevated  anteriorly.  The preperitoneal space was then probed bluntly with a hemostat  until the peritoneum was opened and access was gained to the abdominal  cavity.  A 0 Vicryl pursestring stitch was then placed in the fascia  surrounding the opening. A Hasson cannula was placed through the opening and  anchored in place with the previously placed Vicryl pursestring stitch. The  abdomen was then insufflated with carbon dioxide without difficulty.  The  laparoscope was placed through the Hasson cannula and the right upper  quadrant was inspected.  The patient was placed  in the head up position and  rotated slightly with the right side up.  The dome of the gallbladder and  liver were readily identified.  The epigastric port was then infiltrated  with 1/4% Marcaine. A small incision was made with the 15 blade knife and a  10 mm port was placed bluntly through this incision into the abdominal  cavity under direct vision.  Sites were then chosen laterally on the right  side of the abdomen for placement of 5 mm ports.  Each of these areas was  infiltrated with 1/4% Marcaine. Small stab incisions were made with the 15  blade knife and 5 mm ports were placed bluntly through these incisions into  the abdominal cavity under direct vision.  A blunt grasper was then placed  through the lateral most 5 mm port and used to grasp the dome of the  gallbladder and elevate it anteriorly and superiorly.  A blunt grasper was  placed through the other 5 mm port and used to retract on the body and neck  of the gallbladder.  A dissector was placed through the epigastric port and  using the electrocautery the peritoneal reflection of the gallbladder neck  was opened.  Blunt dissection was then carried out in this area until the  gallbladder neck and cystic neck junction was readily identified and a good  window was created. A single clip was placed proximally on the gallbladder  neck. A small ductotomy was made just below the clip with the laparoscopic  scissors.  A Cook cholangiogram catheter was placed percutaneously through  the anterior abdominal wall under direct vision.  The catheter was then  flushed and placed within the cystic duct and anchored in place with the  clip. The cholangiogram was obtained that showed good emptying into the  duodenum, no filling defects and good length on the cystic duct.  The  anchoring clip and catheter were then removed from the patient.  Three clips  were placed proximally on the cystic duct and the duct was divided within  the two sets of  clips.  Posterior to this the cystic artery at the branch  was identified and again dissected bluntly in a circumferential manner until  a good window was created.  Two clips were placed proximally and one  distally on the artery and the artery was divided between the two.  Next, a  laparoscopic hook cautery device was used to separate the gallbladder from  the liver bed.  Prior to completely detaching the gallbladder from the liver  bed, the liver bed was inspected and several small bleeding points were  coagulated with the electrocautery until the area was completely hemostatic.  The gallbladder was then detached the rest of the way from the liver bed  without difficulty.  A laparoscopic bag was then placed within the abdomen  through the epigastric port,  the gallbladder was placed within the bag and  the bag was sealed.  The laparoscope was then moved through the epigastric  port and the gallbladder grasper was placed through the Hasson cannula and  used to grasp the upper end of the bag.  The bag with the gallbladder was  then removed with the Hasson cannula through the infraumbilical port without  difficulty.  The fascial defect was closed with the previously placed Vicryl  pursestring stitch as well as with another interrupted 0 Vicryl stitch.  The  abdomen was then irrigated with copious amounts of saline until the irrigant  was clear.  The liver bed was inspected again and found to be hemostatic.  The ports were then all removed under direct vision and were found to be  hemostatic.  The gas was allowed to escape. The skin incisions were all  closed with interrupted 4-0 Monocryl subcuticular stitches.  Benzoin and  Steri-Strips were applied.  The patient tolerated the procedure well.  At  the end of the case all needle, sponge and instrument counts were correct. The patient was then awakened and taken to the recovery room in stable  condition.                                                Ollen Gross. Vernell Morgans, M.D.    PST/MEDQ  D:  07/28/2003  T:  07/28/2003  Job:  [id number]

## 2010-07-15 NOTE — Discharge Summary (Signed)
[name], [name]                            ACCOUNT NO.:  [id number]   MEDICAL RECORD NO.:  [id number]                   PATIENT TYPE:  INP   LOCATION:  6529                                 FACILITY:  [facility]   PHYSICIAN:  [place] Bing, M.D.               DATE OF BIRTH:  09-Mar-1932   DATE OF ADMISSION:  05/01/2003  DATE OF DISCHARGE:  05/02/2003                                 DISCHARGE SUMMARY   DISCHARGE DIAGNOSES:  Atypical chest pain on admission, March 4th.  Cardiac  enzymes are negative.  Electrogram is nondiagnostic showing first degree AV  block with occasional PVC.   SECONDARY DIAGNOSES:  1. History of severe three vessel coronary artery disease status post     coronary artery bypass graft surgery in April 2000.  2. Hypertension.  3. Type 2 diabetes.  4. Dyslipidemia.  5. Hiatal hernia with history of Helicobacter pylori infection.  6. Hypothyroidism.   PROCEDURE:  No procedures at this hospitalization.   DISCHARGE DISPOSITION:  [name] is ready for discharge on March 5th.  She came in complaining of a one week history of intermittent chest  discomfort with aching between the shoulders, sometimes exertional,  sometimes at rest.  All cardiac enzymes, including three sets of troponin I  studies in the emergency room, and then three sets as an inpatient, are all  less than 0.01.  The patient is mildly hypokalemic this morning with a  potassium at 3.6.  This will be replenished.  Her TSH was found to be 0.745.  She has had no further chest discomfort of the type that has brought her in  for admission, in the last 24 hours.  She did have a transient pain coursing  beneath the left breast after eating this morning.  She does have a history  of hiatal hernia, and her Prilosec has been increased to 20 mg twice daily.  She is also seen in consultation by Dr. [place] Bing, and he recommended  exercise, Cardiolite study, as an outpatient, to be done at [facility] on Wednesday, March 9th.  The patient goes home with the following medications:  1. Enteric-coated aspirin 325 mg daily.  2. Prilosec 20 mg  twice daily -- that is the new dose.  3. Norvasc 10 mg daily.  4. Altace 10 mg twice daily.  5. Metoprolol 75 mg twice daily.  6. Hydrochlorothiazide 25 mg daily.  7. Synthroid 112 mcg daily.  8. Avandia 4 mg daily.  9. Lipitor 80 mg daily at bedtime.  10.      Nitroglycerine 0.4 mg one tablet under the tongue every five     minutes times three doses, as needed for chest pain.  If pain management     is not applicable, see nitroglycerin for chest pain.   ACTIVITY:  No restrictions.   DIET:  Low sodium, low cholesterol, ADA diet.  FOLLOW UP:  She will have an exercise Cardiolite study Wednesday May 06, 2003.  All the information has been faxed to the office of [facility], [place], [place].  She has been asked to eat nothing  after midnight Tuesday May 05, 2003.  And she is to hold her Lopressor on  the morning of March 9th.  She will see Dr. Tenny Craw in the office after this  stress test to follow up, and this office visit will be arranged.   BRIEF HISTORY:  [name] is a 77 year old female who was admitted to  [facility] through the emergency room on the morning of March 4th.  She awoke with chest discomfort, which has been bothering her for the past  week.  It is intermittent.  It can be exertional.  It causes simultaneous  aching between her shoulder blades, and she says that sometimes it is  improved with Prilosec.  The patient does not have concomitant symptoms of  radiation, nausea, vomiting, diaphoresis, or dyspnea.  The symptoms are also  sometimes improved with Mylanta.  The patient is undergoing coronary artery  bypass graft surgery with relook catheterization showing patent grafts in  2003.  The patient will be admitted to [facility] to rule out  myocardial infarction with serial  cardiac enzymes.  Electrocardiogram on  admission was nondiagnostic.  The patient will have outpatient cardial study  if cardiac enzymes are negative, and she will be placed on nitroglycerin for  prophylaxis for chest pain.   HOSPITAL COURSE:  As described in discharge disposition.  [name] was  admitted to [facility] 01, 2003 with a complaint of chest  discomfort.  This is a discomfort that causes simultaneous aching between  the shoulder blades.  It is exertional in nature, yet it was present upon  awakening on the morning of her presentation.  She has had no further chest  pain while in the hospital.  Her cardiac enzymes times three are negative.  She is scheduled for an exercise Cardiolite study, and will be discharged  after 24 hours here at [facility].  Her laboratory studies are as  follows:  Troponin I studies are 0.01, then 0.01, then 0.01.  TSH 0.745.  Serum electrolytes March 5th were 7136, potassium 3.6, chloride 104,  carbonate 26.  BUN 6, creatinine 0.8, glucose 111, alkaline phosphatase 54,  SGOT 22, SGPT 16.  Her PTT is 34, PT is 12.8, INR is 0.9.  Once again, the  patient will be discharged March 5th, and have a follow up exercise  Cardiolite study on March 9th.  She is to hold her Lopressor on the morning  of March 9th.      Maple Mirza, P.A.                    Harrison Bing, M.D.    GM/MEDQ  D:  05/02/2003  T:  05/03/2003  Job:  [id number]   cc:   Vania Rea. Jarold Motto, M.D. [facility]   Pricilla Riffle, M.D.

## 2010-09-20 ENCOUNTER — Telehealth: Payer: Self-pay | Admitting: Internal Medicine

## 2010-09-20 NOTE — Telephone Encounter (Signed)
Per pt call, pt has RX refill needed lisinopril 5 mg needed to be called into [facility] on [address] [phone]  Pt said she called Pharmacy Thursday and Friday and this morning and said they had faxed a refill request to [place] and have still not heard anything back.   Please [facility] in [facility] to [facility] at number above.

## 2010-09-22 MED ORDER — LISINOPRIL 5 MG PO TABS: 5.0000 mg | ORAL_TABLET | Freq: Every day | ORAL | Status: DC

## 2010-09-22 NOTE — Telephone Encounter (Signed)
Per pt call, wondering about status of RX refill. Pt said pharmacy has faxed request 2x. Please call in RX refill for pt.

## 2010-10-18 ENCOUNTER — Telehealth: Payer: Self-pay | Admitting: Internal Medicine

## 2010-10-18 NOTE — Telephone Encounter (Signed)
Pt having headache, neck, shoulders and face  feels tight and itchy, very worried, please call

## 2010-10-18 NOTE — Telephone Encounter (Signed)
RETURNED PT 'S CALL SPOKE FOR  A LONG PERIOD OF TIME PT C/O  H/A  FACE AND NECK  FEEL TIGHT  ACHY AND ITCHY  NO CHANGE IN LAUNDRY DETERGENT, MAKE UP , OR NO NEW FOODS TRIED.  DID HAVE FEVER LAST NIGHT . ALSO C/O B/P RUNINNG  A LITTLE HIGH TODAY  WAS 176/89 HR 80  YESTERDAY WAS 138/76  WHICH PER PT IS HIGH AS WELL   INFORMED PT TO TRY AND TAKE TYL FOR HEAD ACHE MAY TRY PO  BENADRYL  FOR ITCHINESS  OR MAY TRY TOPICAL ANTI ITCH CREAM  ALSO  THINKS MAYBE THYROID SHOULD BE CHECKED INFORMED PT TO CALL PMD  TO BE EVAL DO NOT THINK SYMPTOMS ARE HEART RELATED . WILL FORWARD TO DR ROSS FOR REVIEW HAS APPT WITH DR  ROSS ON 11-28-10 AT 11:15  AM

## 2010-11-16 LAB — CBC
HCT: 39.3
Hemoglobin: 13.8
MCV: 91.9
Platelets: 225
RDW: 13.6
WBC: 6.7

## 2010-11-16 LAB — BASIC METABOLIC PANEL
BUN: 8
Chloride: 100
Glucose, Bld: 106 — ABNORMAL HIGH
Potassium: 3.8
Sodium: 136

## 2010-11-17 LAB — BASIC METABOLIC PANEL
BUN: 11
BUN: 13
CO2: 28
CO2: 28
CO2: 30
Calcium: 8.2 — ABNORMAL LOW
Calcium: 8.5
Chloride: 90 — ABNORMAL LOW
Chloride: 96
Chloride: 97
Creatinine, Ser: 0.79
Creatinine, Ser: 0.8
GFR calc Af Amer: >60
GFR calc Af Amer: >60
GFR calc Af Amer: >60
GFR calc Af Amer: >60
GFR calc non Af Amer: 51 — ABNORMAL LOW
GFR calc non Af Amer: >60
GFR calc non Af Amer: >60
Glucose, Bld: 106 — ABNORMAL HIGH
Glucose, Bld: 167 — ABNORMAL HIGH
Glucose, Bld: 176 — ABNORMAL HIGH
Potassium: 3.1 — ABNORMAL LOW
Potassium: 3.5
Potassium: 3.8
Potassium: 4.2
Sodium: 130 — ABNORMAL LOW
Sodium: 131 — ABNORMAL LOW
Sodium: 131 — ABNORMAL LOW

## 2010-11-17 LAB — CROSSMATCH
ABO/RH(D): O POS
Antibody Screen: NEGATIVE

## 2010-11-17 LAB — CBC
HCT: 22.6 — ABNORMAL LOW
HCT: 26.3 — ABNORMAL LOW
HCT: 26.7 — ABNORMAL LOW
HCT: 27.7 — ABNORMAL LOW
HCT: 28.1 — ABNORMAL LOW
Hemoglobin: 7.8 — CL
Hemoglobin: 9.2 — ABNORMAL LOW
Hemoglobin: 9.6 — ABNORMAL LOW
MCHC: 34.3
MCHC: 34.5
MCHC: 34.6
MCHC: 35.3
MCV: 93.1
MCV: 93.8
MCV: 94.3
MCV: 95.2
Platelets: 126 — ABNORMAL LOW
Platelets: 263
RBC: 2.38 — ABNORMAL LOW
RBC: 2.82 — ABNORMAL LOW
RBC: 2.83 — ABNORMAL LOW
RBC: 2.96 — ABNORMAL LOW
RDW: 13.8
RDW: 13.9
RDW: 14.4
WBC: 11.1 — ABNORMAL HIGH
WBC: 13 — ABNORMAL HIGH

## 2010-11-17 LAB — IRON AND TIBC
Saturation Ratios: 21
TIBC: 211 — ABNORMAL LOW
UIBC: 166

## 2010-11-17 LAB — CK TOTAL AND CKMB (NOT AT ARMC)
CK, MB: 4.4 — ABNORMAL HIGH
Relative Index: 1.2
Relative Index: 1.8
Relative Index: 2.1

## 2010-11-17 LAB — TROPONIN I: Troponin I: 0.01

## 2010-11-17 LAB — PROTIME-INR
INR: 1
INR: 1
Prothrombin Time: 12.9
Prothrombin Time: 13.1

## 2010-11-18 LAB — CBC
HCT: 29 — ABNORMAL LOW
HCT: 31.3 — ABNORMAL LOW
HCT: 31.7 — ABNORMAL LOW
HCT: 32.2 — ABNORMAL LOW
HCT: 32.2 — ABNORMAL LOW
Hemoglobin: 10.1 — ABNORMAL LOW
Hemoglobin: 10.4 — ABNORMAL LOW
Hemoglobin: 10.7 — ABNORMAL LOW
Hemoglobin: 11.1 — ABNORMAL LOW
Hemoglobin: 9.8 — ABNORMAL LOW
MCHC: 33.4
MCHC: 33.5
MCHC: 33.7
MCHC: 33.8
MCHC: 33.9
MCHC: 34.2
MCHC: 34.4
MCV: 92.5
MCV: 92.6
MCV: 93.2
MCV: 93.3
Platelets: 265
Platelets: 267
Platelets: 279
Platelets: 290
Platelets: 315
RBC: 3.33 — ABNORMAL LOW
RBC: 3.46 — ABNORMAL LOW
RBC: 3.48 — ABNORMAL LOW
RDW: 16.1 — ABNORMAL HIGH
RDW: 16.2 — ABNORMAL HIGH
RDW: 16.2 — ABNORMAL HIGH
RDW: 16.3 — ABNORMAL HIGH
RDW: 16.3 — ABNORMAL HIGH
RDW: 16.5 — ABNORMAL HIGH
RDW: 16.5 — ABNORMAL HIGH
WBC: 7.6
WBC: 8.1
WBC: 8.3

## 2010-11-18 LAB — PROTIME-INR
INR: 1.1
INR: 2.1 — ABNORMAL HIGH
INR: 2.2 — ABNORMAL HIGH
Prothrombin Time: 17.5 — ABNORMAL HIGH
Prothrombin Time: 25.2 — ABNORMAL HIGH

## 2010-11-18 LAB — HEPARIN LEVEL (UNFRACTIONATED)
Heparin Unfractionated: 0.1 — ABNORMAL LOW
Heparin Unfractionated: 0.42
Heparin Unfractionated: 0.76 — ABNORMAL HIGH
Heparin Unfractionated: 0.77 — ABNORMAL HIGH
Heparin Unfractionated: 1.29 — ABNORMAL HIGH
Heparin Unfractionated: 1.55 — ABNORMAL HIGH

## 2010-11-18 LAB — DIFFERENTIAL
Basophils Absolute: 0.1
Basophils Absolute: 0.1
Basophils Relative: 1
Basophils Relative: 1
Eosinophils Absolute: 0
Eosinophils Relative: 0
Eosinophils Relative: 2
Lymphocytes Relative: 23
Lymphocytes Relative: 25
Lymphs Abs: 1.8
Monocytes Absolute: 0.6
Monocytes Absolute: 1.4 — ABNORMAL HIGH
Monocytes Relative: 11
Monocytes Relative: 11
Neutro Abs: 3.6
Neutrophils Relative %: 62

## 2010-11-18 LAB — BASIC METABOLIC PANEL
CO2: 25
Calcium: 9
Creatinine, Ser: 0.81
GFR calc Af Amer: >60
GFR calc non Af Amer: >60
Glucose, Bld: 105 — ABNORMAL HIGH
Sodium: 132 — ABNORMAL LOW

## 2010-11-18 LAB — COMPREHENSIVE METABOLIC PANEL
ALT: 12
AST: 21
Albumin: 2.7 — ABNORMAL LOW
Calcium: 8.7
Creatinine, Ser: 0.81
GFR calc Af Amer: >60
Sodium: 132 — ABNORMAL LOW
Total Protein: 5.7 — ABNORMAL LOW

## 2010-11-18 LAB — CARDIAC PANEL(CRET KIN+CKTOT+MB+TROPI)
CK, MB: 2
Relative Index: INVALID
Total CK: 57

## 2010-11-18 LAB — APTT: aPTT: 28

## 2010-11-18 LAB — LIPID PANEL
Cholesterol: 120
LDL Cholesterol: 57
Total CHOL/HDL Ratio: 2.9

## 2010-11-18 LAB — I-STAT 8, (EC8 V) (CONVERTED LAB)
BUN: 11
Glucose, Bld: 147 — ABNORMAL HIGH
Hemoglobin: 11.9 — ABNORMAL LOW
Potassium: 4.4
Sodium: 129 — ABNORMAL LOW
TCO2: 23

## 2010-11-18 LAB — POCT CARDIAC MARKERS
CKMB, poc: 2.3
Operator id: 284251

## 2010-11-18 LAB — CK TOTAL AND CKMB (NOT AT ARMC): CK, MB: 2.6

## 2010-11-18 LAB — POCT I-STAT CREATININE: Operator id: 284251

## 2010-11-18 LAB — TROPONIN I: Troponin I: 0.1 — ABNORMAL HIGH

## 2010-11-24 LAB — POCT CARDIAC MARKERS
CKMB, poc: 1.4
Myoglobin, poc: 128
Operator id: 294341
Troponin i, poc: <0.05

## 2010-11-24 LAB — POCT I-STAT, CHEM 8
BUN: 9
Chloride: 99
Potassium: 3.9
Sodium: 132 — ABNORMAL LOW

## 2010-11-24 LAB — CBC
Hemoglobin: 13.2
Platelets: 197
RDW: 14.2
WBC: 7.3

## 2010-11-24 LAB — DIFFERENTIAL
Basophils Absolute: 0
Lymphocytes Relative: 23
Lymphs Abs: 1.7
Neutro Abs: 4.8

## 2010-11-24 LAB — B-NATRIURETIC PEPTIDE (CONVERTED LAB): Pro B Natriuretic peptide (BNP): 97

## 2010-11-25 ENCOUNTER — Encounter: Payer: Self-pay | Admitting: Internal Medicine

## 2010-11-27 ENCOUNTER — Other Ambulatory Visit: Payer: Self-pay | Admitting: Internal Medicine

## 2010-11-28 ENCOUNTER — Encounter: Payer: Self-pay | Admitting: Internal Medicine

## 2010-11-28 ENCOUNTER — Ambulatory Visit (INDEPENDENT_AMBULATORY_CARE_PROVIDER_SITE_OTHER): Payer: Medicare Other | Admitting: Internal Medicine

## 2010-11-28 VITALS — BP 141/78 | HR 78 | Resp 18 | Ht 63.0 in | Wt 157.0 lb

## 2010-11-28 DIAGNOSIS — E785 Hyperlipidemia, unspecified: Secondary | ICD-10-CM

## 2010-11-28 DIAGNOSIS — I10 Essential (primary) hypertension: Secondary | ICD-10-CM

## 2010-11-28 DIAGNOSIS — I2510 Atherosclerotic heart disease of native coronary artery without angina pectoris: Secondary | ICD-10-CM

## 2010-11-29 NOTE — Assessment & Plan Note (Signed)
No change in regimen.

## 2010-11-29 NOTE — Assessment & Plan Note (Signed)
Patient is due to see Dr. Eloise Harman.  Would like to get lipids faxed here.

## 2010-11-29 NOTE — Assessment & Plan Note (Signed)
Doing well.  I would continue current regimen.  I would not schedule myoview for now unless she develops SOB, change in activities, CP.

## 2010-11-29 NOTE — Progress Notes (Addendum)
HPI  Patient is a 77 year old with a history of CAD (s/p CABG in 2000).  Outpatient myoview in 2010 was without ischemia. I saw her in December 2010. Since seen she has done well from a cardiac standpoint.  She denies CP.  Breating is OK. She did have a episode about 2 months ago.  After she bent over she experienced spasm in her neck.  No dizziness.  No chest pain. Has had problems with L neck spasms since   Allergies  Allergen Reactions  . Actos (Pioglitazone Hydrochloride)   . Codeine     Current Outpatient Prescriptions  Medication Sig Dispense Refill  . acetaminophen (TYLENOL) 500 MG tablet Take 500 mg by mouth every 6 (six) hours as needed.        Marland Kitchen amLODipine (NORVASC) 10 MG tablet Take 10 mg by mouth daily.        Marland Kitchen aspirin 81 MG tablet Take 81 mg by mouth daily.        Marland Kitchen atorvastatin (LIPITOR) 80 MG tablet Take 80 mg by mouth daily.        . calcium carbonate (TUMS - DOSED IN MG ELEMENTAL CALCIUM) 500 MG chewable tablet Chew 1 tablet by mouth as needed.        . ergocalciferol (VITAMIN D2) 50000 UNITS capsule Take 50,000 Units by mouth once a week.        . furosemide (LASIX) 20 MG tablet Take 20 mg by mouth daily.        Marland Kitchen levothyroxine (SYNTHROID, LEVOTHROID) 75 MCG tablet Take 75 mcg by mouth daily.        Marland Kitchen lisinopril (PRINIVIL,ZESTRIL) 5 MG tablet Take 1 tablet (5 mg total) by mouth daily.  30 tablet  12  . metoprolol (LOPRESSOR) 50 MG tablet Take 50 mg by mouth 2 (two) times daily.        . nitroGLYCERIN (NITROSTAT) 0.4 MG SL tablet Place 0.4 mg under the tongue every 5 (five) minutes as needed.        Marland Kitchen omeprazole (PRILOSEC) 20 MG capsule Take 20 mg by mouth daily.        . potassium chloride SA (K-DUR,KLOR-CON) 20 MEQ tablet Take 20 mEq by mouth daily.          Past Medical History  Diagnosis Date  . Coronary artery disease   . Hypertension   . Hyperlipidemia   . SVT (supraventricular tachycardia)     with ablation  . DJD (degenerative joint disease)   .  Diabetes mellitus   . GERD (gastroesophageal reflux disease)   . IBS (irritable bowel syndrome)   . Hypothyroidism   . Hiatal hernia   . Edema     Past Surgical History  Procedure Date  . Coronary artery bypass graft 2000  . Replacement total knee 2009    left  . Cholecystectomy 2005  . Appendectomy     Family History  Problem Relation Age of Onset  . Breast cancer Sister   . Diabetes Sister     and brother  . Heart disease      sister, father and brother  . Heart attack Mother 26    died  . Heart attack Father 85    died  . Coronary artery disease Brother     PCI    History   Social History  . Marital Status: Single    Spouse Name: N/A    Number of Children: N/A  . Years of Education: N/A  Occupational History  . retired    Social History Main Topics  . Smoking status: Never Smoker   . Smokeless tobacco: Not on file  . Alcohol Use: No  . Drug Use: No  . Sexually Active: Not on file   Other Topics Concern  . Not on file   Social History Narrative  . No narrative on file    Review of Systems:  All systems reviewed.  They are negative to the above problem except as previously stated.  Vital Signs: BP 141/78  Pulse 78  Resp 18  Ht 5\' 3"  (1.6 m)  Wt 157 lb (71.215 kg)  BMI 27.81 kg/m2  Physical Exam Patient is in NAD  HEENT:  Normocephalic, atraumatic. EOMI, PERRLA.  Neck: JVP is normal. No thyromegaly. No bruits.  Lungs: clear to auscultation. No rales no wheezes.  Heart: Regular rate and rhythm. Normal S1, S2. No S3.   No significant murmurs. PMI not displaced.  Abdomen:  Supple, nontender. Normal bowel sounds. No masses. No hepatomegaly.  Extremities:   Good distal pulses throughout. No lower extremity edema.  Musculoskeletal :moving all extremities.  Neuro:   alert and oriented x3.  CN II-XII grossly intact.  EKG:  Sinus rhythm.  62 bpm.  First degree AV block.  Assessment and Plan:

## 2010-11-30 ENCOUNTER — Other Ambulatory Visit: Payer: Self-pay

## 2010-11-30 MED ORDER — AMLODIPINE BESYLATE 10 MG PO TABS: 10.0000 mg | ORAL_TABLET | Freq: Every day | ORAL | Status: DC

## 2010-12-29 ENCOUNTER — Other Ambulatory Visit: Payer: Self-pay | Admitting: Internal Medicine

## 2011-01-23 ENCOUNTER — Other Ambulatory Visit: Payer: Self-pay | Admitting: Internal Medicine

## 2011-02-15 ENCOUNTER — Other Ambulatory Visit: Payer: Self-pay | Admitting: Gastroenterology

## 2011-02-20 ENCOUNTER — Other Ambulatory Visit: Payer: Self-pay | Admitting: Internal Medicine

## 2011-03-01 ENCOUNTER — Other Ambulatory Visit: Payer: Self-pay | Admitting: Internal Medicine

## 2011-05-16 ENCOUNTER — Other Ambulatory Visit: Payer: Self-pay

## 2011-05-16 MED ORDER — LISINOPRIL 5 MG PO TABS: 5.0000 mg | ORAL_TABLET | Freq: Every day | ORAL | Status: DC

## 2011-05-16 MED ORDER — ATORVASTATIN CALCIUM 80 MG PO TABS: 80.0000 mg | ORAL_TABLET | Freq: Every day | ORAL | Status: DC

## 2011-06-02 ENCOUNTER — Encounter: Payer: Self-pay | Admitting: Internal Medicine

## 2011-06-13 ENCOUNTER — Encounter: Payer: Self-pay | Admitting: Internal Medicine

## 2011-06-13 LAB — IFOBT (OCCULT BLOOD): IFOBT: NEGATIVE

## 2011-12-07 ENCOUNTER — Encounter: Payer: Self-pay | Admitting: Internal Medicine

## 2011-12-07 ENCOUNTER — Ambulatory Visit (INDEPENDENT_AMBULATORY_CARE_PROVIDER_SITE_OTHER): Payer: Medicare Other | Admitting: Internal Medicine

## 2011-12-07 VITALS — BP 140/75 | HR 67 | Ht 63.5 in | Wt 157.8 lb

## 2011-12-07 DIAGNOSIS — I25810 Atherosclerosis of coronary artery bypass graft(s) without angina pectoris: Secondary | ICD-10-CM

## 2011-12-07 NOTE — Patient Instructions (Addendum)
Your physician has requested that you have en exercise stress myoview. For further information please visit [email or web address] Please follow instruction sheet, as given.     Your physician recommends that you continue on your current medications as directed. Please refer to the Current Medication list given to you today.  Your physician wants you to follow-up in: 1 year with Dr. Tenny Craw. You will receive a reminder letter in the mail two months in advance. If you don't receive a letter, please call our office to schedule the follow-up appointment.

## 2011-12-07 NOTE — Progress Notes (Signed)
HPI Patient is a 78 year old with a history of CAD (s/p CABG in 2000).  Last myoview was 2010.  I saw her in clinic in 2012. Saturday her chest started hurting.  Went back to house. QUestion if is her reflux.  Was not SOB Last couple wks went up on walking to 2x per day.  Walks a lot Feels good all the time.  Has walked since Saturday without a problem.   Followed lby Dr. Eloise Harman. No other chest pain Allergies  Allergen Reactions  . Actos (Pioglitazone Hydrochloride)   . Codeine     Current Outpatient Prescriptions  Medication Sig Dispense Refill  . acetaminophen (TYLENOL) 500 MG tablet Take 500 mg by mouth every 6 (six) hours as needed.        Marland Kitchen amLODipine (NORVASC) 10 MG tablet Take 1 tablet (10 mg total) by mouth daily.  90 tablet  5  . aspirin 81 MG tablet Take 81 mg by mouth daily.        Marland Kitchen atorvastatin (LIPITOR) 80 MG tablet Take 1 tablet (80 mg total) by mouth daily.  90 tablet  3  . calcium carbonate (TUMS - DOSED IN MG ELEMENTAL CALCIUM) 500 MG chewable tablet Chew 1 tablet by mouth as needed.        . ergocalciferol (VITAMIN D2) 50000 UNITS capsule Take 50,000 Units by mouth once a week.        . furosemide (LASIX) 20 MG tablet TAKE 1 TABLET EVERY DAY  90 tablet  3  . KLOR-CON M20 20 MEQ tablet TAKE 1 TABLET BY MOUTH EVERY DAY  90 tablet  5  . levothyroxine (SYNTHROID, LEVOTHROID) 75 MCG tablet Take 75 mcg by mouth daily.        Marland Kitchen lisinopril (PRINIVIL,ZESTRIL) 5 MG tablet Take 1 tablet (5 mg total) by mouth daily.  30 tablet  6  . lisinopril (PRINIVIL,ZESTRIL) 5 MG tablet Take 1 tablet (5 mg total) by mouth daily.  30 tablet  12  . metoprolol (LOPRESSOR) 50 MG tablet TAKE 1 TABLET BY MOUTH TWICE A DAY  180 tablet  3  . nitroGLYCERIN (NITROSTAT) 0.4 MG SL tablet Place 0.4 mg under the tongue every 5 (five) minutes as needed.        Marland Kitchen omeprazole (PRILOSEC) 20 MG capsule Take 20 mg by mouth daily.          Past Medical History  Diagnosis Date  . Coronary artery disease   .  Hypertension   . Hyperlipidemia   . SVT (supraventricular tachycardia)     with ablation  . DJD (degenerative joint disease)   . Diabetes mellitus   . GERD (gastroesophageal reflux disease)   . IBS (irritable bowel syndrome)   . Hypothyroidism   . Hiatal hernia   . Edema     Past Surgical History  Procedure Date  . Coronary artery bypass graft 2000  . Replacement total knee 2009    left  . Cholecystectomy 2005  . Appendectomy     Family History  Problem Relation Age of Onset  . Breast cancer Sister   . Diabetes Sister     and brother  . Heart disease      sister, father and brother  . Heart attack Mother 39    died  . Heart attack Father 75    died  . Coronary artery disease Brother     PCI    History   Social History  . Marital Status: Single  Spouse Name: N/A    Number of Children: N/A  . Years of Education: N/A   Occupational History  . retired    Social History Main Topics  . Smoking status: Never Smoker   . Smokeless tobacco: Not on file  . Alcohol Use: No  . Drug Use: No  . Sexually Active: Not on file   Other Topics Concern  . Not on file   Social History Narrative  . No narrative on file    Review of Systems:  All systems reviewed.  They are negative to the above problem except as previously stated.  Vital Signs: BP 140/75  Pulse 67  Ht 5' 3.5" (1.613 m)  Wt 157 lb 12.8 oz (71.578 kg)  BMI 27.51 kg/m2  Physical Exam Patient is in NAD HEENT:  Normocephalic, atraumatic. EOMI, PERRLA.  Neck: JVP is normal.  No bruits.  Lungs: clear to auscultation. No rales no wheezes.  Heart: Regular rate and rhythm. Normal S1, S2. No S3.   No significant murmurs. PMI not displaced.  Abdomen:  Supple, nontender. Normal bowel sounds. No masses. No hepatomegaly.  Extremities:   Good distal pulses throughout. No lower extremity edema.  Musculoskeletal :moving all extremities.  Neuro:   alert and oriented x3.  CN II-XII grossly intact.  EKG:  SR   67 bpm.  First degree AV block.  Nonspecific ST changes (unchanged) Assessment and Plan:  1.  CAD  S/p remote CABG  Recent episode of CP  I am not convinced it represents angina.  Would recomm another myoview to evaluate  2.  HL  Follow lipids  3.  HTN  Adequate control.

## 2011-12-11 ENCOUNTER — Ambulatory Visit (HOSPITAL_COMMUNITY): Payer: Medicare Other | Attending: Internal Medicine | Admitting: Radiology

## 2011-12-11 ENCOUNTER — Telehealth: Payer: Self-pay | Admitting: Internal Medicine

## 2011-12-11 VITALS — BP 138/69 | Ht 63.5 in | Wt 155.0 lb

## 2011-12-11 DIAGNOSIS — R0989 Other specified symptoms and signs involving the circulatory and respiratory systems: Secondary | ICD-10-CM | POA: Insufficient documentation

## 2011-12-11 DIAGNOSIS — Z8249 Family history of ischemic heart disease and other diseases of the circulatory system: Secondary | ICD-10-CM | POA: Insufficient documentation

## 2011-12-11 DIAGNOSIS — I2510 Atherosclerotic heart disease of native coronary artery without angina pectoris: Secondary | ICD-10-CM

## 2011-12-11 DIAGNOSIS — R002 Palpitations: Secondary | ICD-10-CM | POA: Insufficient documentation

## 2011-12-11 DIAGNOSIS — R079 Chest pain, unspecified: Secondary | ICD-10-CM

## 2011-12-11 DIAGNOSIS — I25810 Atherosclerosis of coronary artery bypass graft(s) without angina pectoris: Secondary | ICD-10-CM

## 2011-12-11 DIAGNOSIS — R0609 Other forms of dyspnea: Secondary | ICD-10-CM | POA: Insufficient documentation

## 2011-12-11 DIAGNOSIS — I10 Essential (primary) hypertension: Secondary | ICD-10-CM | POA: Insufficient documentation

## 2011-12-11 MED ORDER — TECHNETIUM TC 99M SESTAMIBI GENERIC - CARDIOLITE
10.0000 | Freq: Once | INTRAVENOUS | Status: AC | PRN
  Administered 2011-12-11: 10 via INTRAVENOUS

## 2011-12-11 MED ORDER — TECHNETIUM TC 99M SESTAMIBI GENERIC - CARDIOLITE
30.0000 | Freq: Once | INTRAVENOUS | Status: AC | PRN
  Administered 2011-12-11: 30 via INTRAVENOUS

## 2011-12-11 NOTE — Progress Notes (Signed)
[facility] SITE 3 NUCLEAR MED [address] [id number] [place] [address] [phone]  Cardiology Nuclear Med Study  [name] is a 78 y.o. female     MRN : [id number]     DOB: 04/16/1932  Procedure Date: 12/11/2011  Nuclear Med Background Indication for Stress Test:  Evaluation for Ischemia and Graft Patency History:  '00 CABG, '06 Heart Cath: EF: 60% patent grafts, chronic 80% distal RCA , '09  Ablation: SVT  ECHO: EF: 45% mild MR, '10 MPS: EF: 80% (-) ischemia Cardiac Risk Factors: Family History - CAD, Hypertension and Lipids  Symptoms:  Chest Pain, DOE, Fatigue and Palpitations   Nuclear Pre-Procedure Caffeine/Decaff Intake:  None NPO After: 8:00pm   Lungs:  clear O2 Sat: 96% on room air. IV 0.9% NS with Angio Cath:  22g  IV Site: R Hand  IV Started by:  Cathlyn Parsons, RN  Chest Size (in):  40 Cup Size: B  Height: 5' 3.5" (1.613 m)  Weight:  155 lb (70.308 kg)  BMI:  Body mass index is 27.03 kg/(m^2). Tech Comments:  Metoprolol held x 24 hrs. This patient had a few seconds of chest tightness, so her pictures were checked and were non eventful.    Nuclear Med Study 1 or 2 day study: 1 day  Stress Test Type:  Stress  Reading MD: Dietrich Pates, MD  Order Authorizing Provider:  Gunnar Fusi Kyleena Scheirer,MD  Resting Radionuclide: Technetium 77m Sestamibi  Resting Radionuclide Dose: 11.0 mCi   Stress Radionuclide:  Technetium 72m Sestamibi  Stress Radionuclide Dose: 33.0 mCi           Stress Protocol Rest HR: 83 Stress HR: 144  Rest BP: 138/69 Stress BP: 202/80  Exercise Time (min): 3:46 METS: 5.5   Predicted Max HR: 144 bpm % Max HR: 100 bpm Rate Pressure Product: 62952   Dose of Adenosine (mg):  n/a Dose of Lexiscan: n/a mg  Dose of Atropine (mg): n/a Dose of Dobutamine: n/a mcg/kg/min (at max HR)  Stress Test Technologist: Milana Na, EMT-P  Nuclear Technologist:  Domenic Polite, CNMT     Rest Procedure:  Myocardial perfusion imaging  was performed at rest 45 minutes following the intravenous administration of Technetium 6m Sestamibi. Rest ECG: NSR with non-specific ST-T wave changes  Stress Procedure:  The patient performed treadmill exercise using a Bruce  Protocol for 3:46 minutes. The patient stopped due to fatigue and chest tightness.  There were non specific ST-T wave changes with rare pacs/pvcs.  Technetium 43m Sestamibi was injected at peak exercise and myocardial perfusion imaging was performed after a brief delay. Stress ECG: Non specific ST, T changes  QPS Raw Data Images:  Images were motion corrected.  Soft tissue (diaphragm) underlies inferior wall. Stress Images:  Normal homogeneous uptake in all areas of the myocardium. Rest Images:  Normal homogeneous uptake in all areas of the myocardium. Subtraction (SDS):  No evidence of ischemia. Transient Ischemic Dilatation (Normal <1.22):  1.26 Lung/Heart Ratio (Normal <0.45):  0.34  Quantitative Gated Spect Images QGS EDV:  70 ml QGS ESV:  22 ml  Impression Exercise Capacity:  Poor exercise capacity. BP Response:  Normal blood pressure response. Clinical Symptoms:  Mild chest pain/dyspnea. ECG Impression:  No significant ST segment change suggestive of ischemia. Comparison with Prior Nuclear Study: No change from report of 2010  Overall Impression:  Normal stress nuclear study.  LV Ejection Fraction: 68%.  LV Wall Motion:  NL LV Function; NL Wall Motion  Dietrich Pates

## 2011-12-11 NOTE — Telephone Encounter (Signed)
Pt rtn someone's call to obtain her lab results

## 2011-12-11 NOTE — Telephone Encounter (Signed)
PT AWARE OF LAB RESULTS./CY 

## 2011-12-13 ENCOUNTER — Telehealth: Payer: Self-pay | Admitting: Internal Medicine

## 2011-12-13 NOTE — Telephone Encounter (Signed)
Line busy X2

## 2011-12-13 NOTE — Telephone Encounter (Signed)
Pt aware of results and to walk more.

## 2011-12-13 NOTE — Telephone Encounter (Signed)
New problem   Stress test results.   

## 2011-12-19 ENCOUNTER — Other Ambulatory Visit: Payer: Self-pay | Admitting: Internal Medicine

## 2011-12-20 ENCOUNTER — Other Ambulatory Visit: Payer: Self-pay | Admitting: Gastroenterology

## 2011-12-31 ENCOUNTER — Other Ambulatory Visit: Payer: Self-pay | Admitting: Internal Medicine

## 2012-01-18 ENCOUNTER — Other Ambulatory Visit: Payer: Self-pay | Admitting: Internal Medicine

## 2012-02-23 ENCOUNTER — Other Ambulatory Visit: Payer: Self-pay | Admitting: *Deleted

## 2012-02-23 MED ORDER — AMLODIPINE BESYLATE 10 MG PO TABS: 10.0000 mg | ORAL_TABLET | Freq: Every day | ORAL | Status: DC

## 2012-05-16 ENCOUNTER — Other Ambulatory Visit: Payer: Self-pay | Admitting: Internal Medicine

## 2012-05-22 ENCOUNTER — Other Ambulatory Visit: Payer: Self-pay | Admitting: Internal Medicine

## 2012-07-11 ENCOUNTER — Encounter: Payer: Self-pay | Admitting: Internal Medicine

## 2012-12-16 ENCOUNTER — Ambulatory Visit (INDEPENDENT_AMBULATORY_CARE_PROVIDER_SITE_OTHER): Payer: Medicare Other | Admitting: Internal Medicine

## 2012-12-16 ENCOUNTER — Encounter: Payer: Self-pay | Admitting: Internal Medicine

## 2012-12-16 VITALS — BP 148/72 | HR 64 | Wt 152.0 lb

## 2012-12-16 DIAGNOSIS — I10 Essential (primary) hypertension: Secondary | ICD-10-CM

## 2012-12-16 DIAGNOSIS — E7800 Pure hypercholesterolemia, unspecified: Secondary | ICD-10-CM

## 2012-12-16 DIAGNOSIS — I2510 Atherosclerotic heart disease of native coronary artery without angina pectoris: Secondary | ICD-10-CM

## 2012-12-16 DIAGNOSIS — Z0000 Encounter for general adult medical examination without abnormal findings: Secondary | ICD-10-CM

## 2012-12-16 NOTE — Patient Instructions (Signed)
Flu shot today  Your physician wants you to follow-up in: 1 year You will receive a reminder letter in the mail two months in advance. If you don't receive a letter, please call our office to schedule the follow-up appointment.

## 2012-12-16 NOTE — Progress Notes (Signed)
HPI Patient is a 79 year old with a history of CAD (s/p CABG in 2000).  Last myoview was Oct 2013 and it was normal.  .  I saw her in clinic in 2013. Since seen she denies CP  Breathing is OK  No SOB  Walks  Allergies  Allergen Reactions  . Actos [Pioglitazone Hydrochloride]   . Codeine     Current Outpatient Prescriptions  Medication Sig Dispense Refill  . acetaminophen (TYLENOL) 500 MG tablet Take 500 mg by mouth every 6 (six) hours as needed.        Marland Kitchen amLODipine (NORVASC) 10 MG tablet Take 1 tablet (10 mg total) by mouth daily.  90 tablet  3  . aspirin 81 MG tablet Take 81 mg by mouth daily.        Marland Kitchen atorvastatin (LIPITOR) 80 MG tablet TAKE 1 TABLET BY MOUTH EVERY EVENING  90 tablet  3  . calcium carbonate (TUMS - DOSED IN MG ELEMENTAL CALCIUM) 500 MG chewable tablet Chew 1 tablet by mouth as needed.        . ergocalciferol (VITAMIN D2) 50000 UNITS capsule Take 50,000 Units by mouth once a week.        . esomeprazole (NEXIUM) 20 MG capsule Take 20 mg by mouth daily before breakfast.      . furosemide (LASIX) 20 MG tablet TAKE 1 TABLET EVERY DAY  90 tablet  3  . KLOR-CON M20 20 MEQ tablet TAKE 1 TABLET BY MOUTH EVERY DAY  90 tablet  2  . levothyroxine (SYNTHROID, LEVOTHROID) 75 MCG tablet Take 75 mcg by mouth daily.        Marland Kitchen lisinopril (PRINIVIL,ZESTRIL) 5 MG tablet TAKE 1 TABLET BY MOUTH EVERY DAY  30 tablet  6  . metoprolol (LOPRESSOR) 50 MG tablet TAKE 1 TABLET BY MOUTH TWICE A DAY  180 tablet  3  . nitroGLYCERIN (NITROSTAT) 0.4 MG SL tablet Place 0.4 mg under the tongue every 5 (five) minutes as needed.         No current facility-administered medications for this visit.    Past Medical History  Diagnosis Date  . Coronary artery disease   . Hypertension   . Hyperlipidemia   . SVT (supraventricular tachycardia)     with ablation  . DJD (degenerative joint disease)   . Diabetes mellitus   . GERD (gastroesophageal reflux disease)   . IBS (irritable bowel syndrome)   .  Hypothyroidism   . Hiatal hernia   . Edema     Past Surgical History  Procedure Laterality Date  . Coronary artery bypass graft  2000  . Replacement total knee  2009    left  . Cholecystectomy  2005  . Appendectomy      Family History  Problem Relation Age of Onset  . Breast cancer Sister   . Diabetes Sister     and brother  . Heart disease      sister, father and brother  . Heart attack Mother 67    died  . Heart attack Father 31    died  . Coronary artery disease Brother     PCI    History   Social History  . Marital Status: Single    Spouse Name: N/A    Number of Children: N/A  . Years of Education: N/A   Occupational History  . retired    Social History Main Topics  . Smoking status: Never Smoker   . Smokeless tobacco: Not on  file  . Alcohol Use: No  . Drug Use: No  . Sexual Activity: Not on file   Other Topics Concern  . Not on file   Social History Narrative  . No narrative on file    Review of Systems:  All systems reviewed.  They are negative to the above problem except as previously stated.  Vital Signs: Wt 152 lb (68.947 kg)  BMI 26.5 kg/m2  Physical Exam Patient is in NAD HEENT:  Normocephalic, atraumatic. EOMI, PERRLA.  Neck: JVP is normal.  No bruits.  Lungs: clear to auscultation. No rales no wheezes.  Heart: Regular rate and rhythm. Normal S1, S2. No S3.   No significant murmurs. PMI not displaced.  Abdomen:  Supple, nontender. Normal bowel sounds. No masses. No hepatomegaly.  Extremities:   Good distal pulses throughout. No lower extremity edema.  Musculoskeletal :moving all extremities.  Neuro:   alert and oriented x3.  CN II-XII grossly intact.  EKG:  SR  64 bpm  First degree AV block  258 msec.  Nonspecific ST T wave changes Assessment and Plan:  1.  CAD  S/p remote CABG    Myoview 1 yr ago normal  Doing good  Continue meds  2.  HL  Follow lipids  Will get labs from D Patterson's office  3.  HTN  Adequate control.   Continue  Given card for A [place] in PT  Has back pains Flu shot today if possible  ]\]

## 2012-12-20 ENCOUNTER — Other Ambulatory Visit: Payer: Self-pay | Admitting: Internal Medicine

## 2013-01-16 ENCOUNTER — Other Ambulatory Visit: Payer: Self-pay | Admitting: Internal Medicine

## 2013-02-17 ENCOUNTER — Other Ambulatory Visit: Payer: Self-pay | Admitting: Internal Medicine

## 2013-02-18 ENCOUNTER — Other Ambulatory Visit: Payer: Self-pay | Admitting: Internal Medicine

## 2013-05-12 ENCOUNTER — Other Ambulatory Visit: Payer: Self-pay | Admitting: Internal Medicine

## 2013-08-08 LAB — IFOBT (OCCULT BLOOD): IFOBT: NEGATIVE

## 2013-11-03 ENCOUNTER — Other Ambulatory Visit: Payer: Self-pay | Admitting: Internal Medicine

## 2013-11-13 ENCOUNTER — Other Ambulatory Visit: Payer: Self-pay | Admitting: Internal Medicine

## 2013-12-11 ENCOUNTER — Other Ambulatory Visit: Payer: Self-pay | Admitting: Internal Medicine

## 2013-12-18 NOTE — Progress Notes (Signed)
HPI Patient is a 80 year old with a history of CAD (s/p CABG in 2000).  Last myoview was Oct 2013 and it was normal.  .  I saw the patient in October 2014 SInce seen she has done well  NO signif SOB  NO CP Denies dizziness  Allergies  Allergen Reactions  . Actos [Pioglitazone Hydrochloride]   . Codeine     Current Outpatient Prescriptions  Medication Sig Dispense Refill  . acetaminophen (TYLENOL) 500 MG tablet Take 500 mg by mouth every 6 (six) hours as needed.        Marland Kitchen. amLODipine (NORVASC) 10 MG tablet TAKE 1 TABLET BY MOUTH EVERY DAY  90 tablet  2  . aspirin 81 MG tablet Take 81 mg by mouth daily.        Marland Kitchen. atorvastatin (LIPITOR) 80 MG tablet TAKE 1 TABLET BY MOUTH EVERY EVENING  90 tablet  0  . calcium carbonate (TUMS - DOSED IN MG ELEMENTAL CALCIUM) 500 MG chewable tablet Chew 1 tablet by mouth as needed.        . ergocalciferol (VITAMIN D2) 50000 UNITS capsule Take 50,000 Units by mouth once a week.        . esomeprazole (NEXIUM) 20 MG capsule Take 20 mg by mouth daily before breakfast.      . furosemide (LASIX) 20 MG tablet TAKE 1 TABLET EVERY DAY  90 tablet  3  . KLOR-CON M20 20 MEQ tablet TAKE 1 TABLET BY MOUTH EVERY DAY  90 tablet  2  . levothyroxine (SYNTHROID, LEVOTHROID) 75 MCG tablet Take 75 mcg by mouth daily.        Marland Kitchen. lisinopril (PRINIVIL,ZESTRIL) 5 MG tablet TAKE 1 TABLET BY MOUTH EVERY DAY  30 tablet  6  . metoprolol (LOPRESSOR) 50 MG tablet Take 0.5 tablets (25 mg total) by mouth 2 (two) times daily.  180 tablet  3  . nitroGLYCERIN (NITROSTAT) 0.4 MG SL tablet Place 0.4 mg under the tongue every 5 (five) minutes as needed.         No current facility-administered medications for this visit.    Past Medical History  Diagnosis Date  . Coronary artery disease   . Hypertension   . Hyperlipidemia   . SVT (supraventricular tachycardia)     with ablation  . DJD (degenerative joint disease)   . Diabetes mellitus   . GERD (gastroesophageal reflux disease)   . IBS  (irritable bowel syndrome)   . Hypothyroidism   . Hiatal hernia   . Edema     Past Surgical History  Procedure Laterality Date  . Coronary artery bypass graft  2000  . Replacement total knee  2009    left  . Cholecystectomy  2005  . Appendectomy      Family History  Problem Relation Age of Onset  . Breast cancer Sister   . Diabetes Sister     and brother  . Heart disease      sister, father and brother  . Heart attack Mother [id number]    died  . Heart attack Father [id number]    died  . Coronary artery disease Brother     PCI    History   Social History  . Marital Status: Single    Spouse Name: N/A    Number of Children: N/A  . Years of Education: N/A   Occupational History  . retired    Social History Main Topics  . Smoking status: Never Smoker   . Smokeless  tobacco: Not on file  . Alcohol Use: No  . Drug Use: No  . Sexual Activity: Not on file   Other Topics Concern  . Not on file   Social History Narrative  . No narrative on file    Review of Systems:  All systems reviewed.  They are negative to the above problem except as previously stated.  Vital Signs: BP 126/70  Pulse 59  Ht 5' 3.5" (1.613 m)  Wt 151 lb 12.8 oz (68.856 kg)  BMI 26.47 kg/m2  Physical Exam Patient is in NAD HEENT:  Normocephalic, atraumatic. EOMI, PERRLA.  Neck: JVP is normal.  No bruits.  Lungs: clear to auscultation. No rales no wheezes.  Heart: Regular rate and rhythm. Normal S1, S2. No S3.   No significant murmurs. PMI not displaced.  Abdomen:  Supple, nontender. Normal bowel sounds. No masses. No hepatomegaly.  Extremities:   Good distal pulses throughout. No lower extremity edema.  Musculoskeletal :moving all extremities.  Neuro:   alert and oriented x3.  CN II-XII grossly intact.  EKG:  SR  59   bpm  Type 1 second degree AV block.  Nonspecific ST T wave changes Assessment and Plan:  1.  CAD  S/p remote CABG   Active  DOing well   WOuld back down on metoprolol to 25 bid  HR  is a ltitle slow.  2.  HL  Follow lipids  Will get labs from D Patterson's office  3.  HTN  Adequate control.  Continue  Check BP in 1 month after med change  Get EKG as well   ]\]

## 2013-12-19 ENCOUNTER — Encounter: Payer: Self-pay | Admitting: Internal Medicine

## 2013-12-19 ENCOUNTER — Ambulatory Visit (INDEPENDENT_AMBULATORY_CARE_PROVIDER_SITE_OTHER): Payer: Medicare Other | Admitting: Internal Medicine

## 2013-12-19 VITALS — BP 126/70 | HR 59 | Ht 63.5 in | Wt 151.8 lb

## 2013-12-19 DIAGNOSIS — I10 Essential (primary) hypertension: Secondary | ICD-10-CM

## 2013-12-19 MED ORDER — METOPROLOL TARTRATE 50 MG PO TABS: 25.0000 mg | ORAL_TABLET | Freq: Two times a day (BID) | ORAL | Status: DC

## 2013-12-19 NOTE — Patient Instructions (Signed)
Your physician has recommended you make the following change in your medication:  1.) decrease metoprolol to 25 mg twice daily  Your physician recommends that you schedule a follow-up appointment in: 1 month for EKG, and Nurse Visit. Your physician wants you to follow-up in: 1 year with Dr. Tenny Crawoss.  You will receive a reminder letter in the mail two months in advance. If you don't receive a letter, please call our office to schedule the follow-up appointment.

## 2014-01-12 ENCOUNTER — Other Ambulatory Visit: Payer: Self-pay | Admitting: Internal Medicine

## 2014-01-19 ENCOUNTER — Ambulatory Visit (INDEPENDENT_AMBULATORY_CARE_PROVIDER_SITE_OTHER): Payer: Medicare Other | Admitting: *Deleted

## 2014-01-19 VITALS — BP 128/64 | HR 73 | Resp 16 | Wt 153.0 lb

## 2014-01-19 DIAGNOSIS — R9431 Abnormal electrocardiogram [ECG] [EKG]: Secondary | ICD-10-CM

## 2014-01-19 MED ORDER — METOPROLOL TARTRATE 25 MG PO TABS: 25.0000 mg | ORAL_TABLET | Freq: Two times a day (BID) | ORAL | Status: DC

## 2014-01-19 NOTE — Progress Notes (Signed)
1.) Reason for visit: BP check / EKG  2.) Name of MD requesting visit: Dietrich Patesaula Ross  3.) H&P: on 10/23 pt seen in office visit, pt had 1st degree AV block, HR was 59, metoprolol was decreased to 25 mg BID  4.) ROS related to problem: pt without c/o today-taking all meds as instructed.  5.) Assessment and plan per MD: BP 128/64, HR 73, EKG shows 1st degree AV block w/ occas PVC. Reviewed by Dr. Eden EmmsNishan, (DOD) No change in plan of care.

## 2014-02-03 ENCOUNTER — Other Ambulatory Visit: Payer: Self-pay | Admitting: Internal Medicine

## 2014-07-03 ENCOUNTER — Encounter: Payer: Self-pay | Admitting: Gastroenterology

## 2014-07-31 ENCOUNTER — Other Ambulatory Visit: Payer: Self-pay | Admitting: Internal Medicine

## 2014-07-31 NOTE — Telephone Encounter (Signed)
Per note 10.23.15

## 2014-08-28 ENCOUNTER — Encounter: Payer: Self-pay | Admitting: Gastroenterology

## 2014-10-26 ENCOUNTER — Other Ambulatory Visit: Payer: Self-pay | Admitting: Internal Medicine

## 2014-11-27 ENCOUNTER — Ambulatory Visit (HOSPITAL_COMMUNITY)
Admission: RE | Admit: 2014-11-27 | Discharge: 2014-11-27 | Disposition: A | Discharge status: Home / Self Care | Payer: Medicare Other | Source: Ambulatory Visit | Attending: Internal Medicine | Admitting: Internal Medicine

## 2014-11-27 ENCOUNTER — Encounter (HOSPITAL_COMMUNITY): Payer: Self-pay

## 2014-11-27 DIAGNOSIS — M810 Age-related osteoporosis without current pathological fracture: Secondary | ICD-10-CM | POA: Diagnosis present

## 2014-11-27 MED ORDER — DENOSUMAB 60 MG/ML ~~LOC~~ SOLN
60.0000 mg | Freq: Once | SUBCUTANEOUS | Status: AC
  Administered 2014-11-27: 60 mg via SUBCUTANEOUS
  Filled 2014-11-27: qty 1

## 2014-11-27 NOTE — Discharge Instructions (Signed)
Denosumab injection What is this medicine? DENOSUMAB (den oh sue mab) slows bone breakdown. Prolia is used to treat osteoporosis in women after menopause and in men. Xgeva is used to prevent bone fractures and other bone problems caused by cancer bone metastases. Xgeva is also used to treat giant cell tumor of the bone. This medicine may be used for other purposes; ask your health care provider or pharmacist if you have questions. COMMON BRAND NAME(S): Prolia, XGEVA What should I tell my health care provider before I take this medicine? They need to know if you have any of these conditions: -dental disease -eczema -infection or history of infections -kidney disease or on dialysis -low blood calcium or vitamin D -malabsorption syndrome -scheduled to have surgery or tooth extraction -taking medicine that contains denosumab -thyroid or parathyroid disease -an unusual reaction to denosumab, other medicines, foods, dyes, or preservatives -pregnant or trying to get pregnant -breast-feeding How should I use this medicine? This medicine is for injection under the skin. It is given by a health care professional in a hospital or clinic setting. If you are getting Prolia, a special MedGuide will be given to you by the pharmacist with each prescription and refill. Be sure to read this information carefully each time. For Prolia, talk to your pediatrician regarding the use of this medicine in children. Special care may be needed. For Xgeva, talk to your pediatrician regarding the use of this medicine in children. While this drug may be prescribed for children as young as 13 years for selected conditions, precautions do apply. Overdosage: If you think you've taken too much of this medicine contact a poison control center or emergency room at once. Overdosage: If you think you have taken too much of this medicine contact a poison control center or emergency room at once. NOTE: This medicine is only for  you. Do not share this medicine with others. What if I miss a dose? It is important not to miss your dose. Call your doctor or health care professional if you are unable to keep an appointment. What may interact with this medicine? Do not take this medicine with any of the following medications: -other medicines containing denosumab This medicine may also interact with the following medications: -medicines that suppress the immune system -medicines that treat cancer -steroid medicines like prednisone or cortisone This list may not describe all possible interactions. Give your health care provider a list of all the medicines, herbs, non-prescription drugs, or dietary supplements you use. Also tell them if you smoke, drink alcohol, or use illegal drugs. Some items may interact with your medicine. What should I watch for while using this medicine? Visit your doctor or health care professional for regular checks on your progress. Your doctor or health care professional may order blood tests and other tests to see how you are doing. Call your doctor or health care professional if you get a cold or other infection while receiving this medicine. Do not treat yourself. This medicine may decrease your body's ability to fight infection. You should make sure you get enough calcium and vitamin D while you are taking this medicine, unless your doctor tells you not to. Discuss the foods you eat and the vitamins you take with your health care professional. See your dentist regularly. Brush and floss your teeth as directed. Before you have any dental work done, tell your dentist you are receiving this medicine. Do not become pregnant while taking this medicine or for 5 months after stopping   it. Women should inform their doctor if they wish to become pregnant or think they might be pregnant. There is a potential for serious side effects to an unborn child. Talk to your health care professional or pharmacist for more  information. What side effects may I notice from receiving this medicine? Side effects that you should report to your doctor or health care professional as soon as possible: -allergic reactions like skin rash, itching or hives, swelling of the face, lips, or tongue -breathing problems -chest pain -fast, irregular heartbeat -feeling faint or lightheaded, falls -fever, chills, or any other sign of infection -muscle spasms, tightening, or twitches -numbness or tingling -skin blisters or bumps, or is dry, peels, or red -slow healing or unexplained pain in the mouth or jaw -unusual bleeding or bruising Side effects that usually do not require medical attention (Report these to your doctor or health care professional if they continue or are bothersome.): -muscle pain -stomach upset, gas This list may not describe all possible side effects. Call your doctor for medical advice about side effects. You may report side effects to FDA at 1-800-FDA-1088. Where should I keep my medicine? This medicine is only given in a clinic, doctor's office, or other health care setting and will not be stored at home. NOTE: This sheet is a summary. It may not cover all possible information. If you have questions about this medicine, talk to your doctor, pharmacist, or health care provider.  2015, Elsevier/Gold Standard. (2011-08-14 12:37:47)  

## 2014-11-27 NOTE — Progress Notes (Signed)
Patient stayed for 30 minute post injection observation. No signs of adverse reaction noted. Instructed patient to call Dr. Silvano Rusk office for problems and concerns once discharged from Short Stay. Patient verbalized understanding.

## 2014-11-30 ENCOUNTER — Other Ambulatory Visit: Payer: Self-pay | Admitting: Internal Medicine

## 2014-12-01 ENCOUNTER — Encounter (HOSPITAL_COMMUNITY): Payer: Self-pay | Admitting: Emergency Medicine

## 2014-12-01 ENCOUNTER — Emergency Department (HOSPITAL_COMMUNITY)
Admission: EM | Admit: 2014-12-01 | Discharge: 2014-12-01 | Disposition: A | Discharge status: Home / Self Care | Payer: Medicare Other | Attending: Emergency Medicine | Admitting: Emergency Medicine

## 2014-12-01 ENCOUNTER — Emergency Department (HOSPITAL_COMMUNITY): Payer: Medicare Other

## 2014-12-01 DIAGNOSIS — E785 Hyperlipidemia, unspecified: Secondary | ICD-10-CM | POA: Insufficient documentation

## 2014-12-01 DIAGNOSIS — S2020XA Contusion of thorax, unspecified, initial encounter: Secondary | ICD-10-CM | POA: Insufficient documentation

## 2014-12-01 DIAGNOSIS — Z79899 Other long term (current) drug therapy: Secondary | ICD-10-CM | POA: Diagnosis not present

## 2014-12-01 DIAGNOSIS — S93509A Unspecified sprain of unspecified toe(s), initial encounter: Secondary | ICD-10-CM | POA: Insufficient documentation

## 2014-12-01 DIAGNOSIS — I2510 Atherosclerotic heart disease of native coronary artery without angina pectoris: Secondary | ICD-10-CM | POA: Insufficient documentation

## 2014-12-01 DIAGNOSIS — E119 Type 2 diabetes mellitus without complications: Secondary | ICD-10-CM | POA: Diagnosis not present

## 2014-12-01 DIAGNOSIS — R079 Chest pain, unspecified: Secondary | ICD-10-CM | POA: Diagnosis not present

## 2014-12-01 DIAGNOSIS — E039 Hypothyroidism, unspecified: Secondary | ICD-10-CM | POA: Insufficient documentation

## 2014-12-01 DIAGNOSIS — I10 Essential (primary) hypertension: Secondary | ICD-10-CM | POA: Diagnosis not present

## 2014-12-01 DIAGNOSIS — S8001XA Contusion of right knee, initial encounter: Secondary | ICD-10-CM | POA: Diagnosis not present

## 2014-12-01 DIAGNOSIS — S199XXA Unspecified injury of neck, initial encounter: Secondary | ICD-10-CM | POA: Insufficient documentation

## 2014-12-01 DIAGNOSIS — Z7982 Long term (current) use of aspirin: Secondary | ICD-10-CM | POA: Diagnosis not present

## 2014-12-01 DIAGNOSIS — K219 Gastro-esophageal reflux disease without esophagitis: Secondary | ICD-10-CM | POA: Insufficient documentation

## 2014-12-01 DIAGNOSIS — S29009A Unspecified injury of muscle and tendon of unspecified wall of thorax, initial encounter: Secondary | ICD-10-CM | POA: Diagnosis present

## 2014-12-01 DIAGNOSIS — Y998 Other external cause status: Secondary | ICD-10-CM | POA: Diagnosis not present

## 2014-12-01 DIAGNOSIS — Z8739 Personal history of other diseases of the musculoskeletal system and connective tissue: Secondary | ICD-10-CM | POA: Diagnosis not present

## 2014-12-01 DIAGNOSIS — Z951 Presence of aortocoronary bypass graft: Secondary | ICD-10-CM | POA: Diagnosis not present

## 2014-12-01 DIAGNOSIS — S3991XA Unspecified injury of abdomen, initial encounter: Secondary | ICD-10-CM | POA: Diagnosis not present

## 2014-12-01 DIAGNOSIS — S4992XA Unspecified injury of left shoulder and upper arm, initial encounter: Secondary | ICD-10-CM | POA: Diagnosis not present

## 2014-12-01 DIAGNOSIS — Y92410 Unspecified street and highway as the place of occurrence of the external cause: Secondary | ICD-10-CM | POA: Diagnosis not present

## 2014-12-01 DIAGNOSIS — Y9389 Activity, other specified: Secondary | ICD-10-CM | POA: Diagnosis not present

## 2014-12-01 LAB — BASIC METABOLIC PANEL
Anion gap: 9 (ref 5–15)
BUN: 11 mg/dL (ref 6–20)
CALCIUM: 9.3 mg/dL (ref 8.9–10.3)
CO2: 23 mmol/L (ref 22–32)
Chloride: 103 mmol/L (ref 101–111)
Creatinine, Ser: 0.73 mg/dL (ref 0.44–1.00)
GFR calc Af Amer: >60 mL/min (ref >60)
GLUCOSE: 159 mg/dL — AB (ref 65–99)
Potassium: 4.4 mmol/L (ref 3.5–5.1)
SODIUM: 135 mmol/L (ref 135–145)

## 2014-12-01 LAB — CBC
HCT: 40 % (ref 36.0–46.0)
Hemoglobin: 14.4 g/dL (ref 12.0–15.0)
MCH: 32.9 pg (ref 26.0–34.0)
MCHC: 36 g/dL (ref 30.0–36.0)
MCV: 91.3 fL (ref 78.0–100.0)
PLATELETS: 173 10*3/uL (ref 150–400)
RBC: 4.38 MIL/uL (ref 3.87–5.11)
RDW: 13 % (ref 11.5–15.5)
WBC: 9.3 10*3/uL (ref 4.0–10.5)

## 2014-12-01 LAB — I-STAT TROPONIN, ED: TROPONIN I, POC: 0.02 ng/mL (ref 0.00–0.08)

## 2014-12-01 MED ORDER — FENTANYL CITRATE (PF) 100 MCG/2ML IJ SOLN
50.0000 ug | Freq: Once | INTRAMUSCULAR | Status: AC
  Administered 2014-12-01: 50 ug via INTRAVENOUS
  Filled 2014-12-01: qty 2

## 2014-12-01 MED ORDER — IOHEXOL 300 MG/ML  SOLN
100.0000 mL | Freq: Once | INTRAMUSCULAR | Status: AC | PRN
  Administered 2014-12-01: 100 mL via INTRAVENOUS

## 2014-12-01 MED ORDER — ONDANSETRON HCL 4 MG/2ML IJ SOLN
4.0000 mg | Freq: Once | INTRAMUSCULAR | Status: AC
  Administered 2014-12-01: 4 mg via INTRAVENOUS
  Filled 2014-12-01: qty 2

## 2014-12-01 MED ORDER — ONDANSETRON HCL 4 MG PO TABS: 4.0000 mg | ORAL_TABLET | Freq: Four times a day (QID) | ORAL | Status: DC

## 2014-12-01 MED ORDER — HYDROCODONE-ACETAMINOPHEN 5-325 MG PO TABS: 1.0000 | ORAL_TABLET | Freq: Four times a day (QID) | ORAL | Status: DC | PRN
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
## 2015-01-11 ENCOUNTER — Encounter: Payer: Self-pay | Admitting: Internal Medicine

## 2015-01-11 ENCOUNTER — Ambulatory Visit (INDEPENDENT_AMBULATORY_CARE_PROVIDER_SITE_OTHER): Payer: Medicare Other | Admitting: Internal Medicine

## 2015-01-11 VITALS — BP 130/70 | HR 73 | Ht 64.0 in | Wt 142.8 lb

## 2015-01-11 DIAGNOSIS — E785 Hyperlipidemia, unspecified: Secondary | ICD-10-CM

## 2015-01-11 DIAGNOSIS — I10 Essential (primary) hypertension: Secondary | ICD-10-CM | POA: Diagnosis not present

## 2015-01-11 NOTE — Progress Notes (Signed)
Cardiology Office Note   Date:  01/11/2015   ID:  [name], DOB Apr 29, 1932, MRN [id number]  PCP:  Garlan FillersPATERSON,DANIEL G, MD  Cardiologist:   Dietrich PatesPaula Latarshia Jersey, MD   No chief complaint on file.     History of Present Illness: [name] is a 81 y.o. female with a history of CAD (s/p CABG in 2000)  Myoview in 2013  Normal Since seen she has done OK     No CP  No SOB      Current Outpatient Prescriptions  Medication Sig Dispense Refill  . acetaminophen (TYLENOL) 500 MG tablet Take 1,000 mg by mouth 2 (two) times daily as needed for mild pain or moderate pain.     Marland Kitchen. amLODipine (NORVASC) 10 MG tablet TAKE 1 TABLET BY MOUTH EVERY DAY (Patient taking differently: Take 1 tablet by mouth every day.) 90 tablet 1  . aspirin 81 MG tablet Take 81 mg by mouth daily.      Marland Kitchen. atorvastatin (LIPITOR) 80 MG tablet TAKE 1 TABLET BY MOUTH EVERY EVENING 90 tablet 0  . calcium carbonate (TUMS - DOSED IN MG ELEMENTAL CALCIUM) 500 MG chewable tablet Chew 1 tablet by mouth as needed for indigestion or heartburn.     . ergocalciferol (VITAMIN D2) 50000 UNITS capsule Take 50,000 Units by mouth once a week.      . esomeprazole (NEXIUM) 20 MG capsule Take 20 mg by mouth daily before breakfast.    . furosemide (LASIX) 20 MG tablet TAKE 1 TABLET BY MOUTH EVERY DAY (Patient taking differently: Take 1 tablet by mouth every day.) 90 tablet 3  . KLOR-CON M20 20 MEQ tablet TAKE 1 TABLET BY MOUTH EVERY DAY (Patient taking differently: Take 1 tablet by mouth every day.) 90 tablet 1  . levothyroxine (SYNTHROID, LEVOTHROID) 75 MCG tablet Take 75 mcg by mouth daily.      Marland Kitchen. lisinopril (PRINIVIL,ZESTRIL) 5 MG tablet TAKE 1 TABLET BY MOUTH EVERY DAY (Patient taking differently: Take 1 tablet by mouth every day.) 30 tablet 6  . metoprolol (LOPRESSOR) 25 MG tablet Take 1 tablet (25 mg total) by mouth 2 (two) times daily.     No current facility-administered medications for this visit.    Allergies:   Actos and Codeine     Past Medical History  Diagnosis Date  . Coronary artery disease   . Hypertension   . Hyperlipidemia   . SVT (supraventricular tachycardia) (HCC)     with ablation  . DJD (degenerative joint disease)   . Diabetes mellitus   . GERD (gastroesophageal reflux disease)   . IBS (irritable bowel syndrome)   . Hypothyroidism   . Hiatal hernia   . Edema     Past Surgical History  Procedure Laterality Date  . Coronary artery bypass graft  2000  . Replacement total knee  2009    left  . Cholecystectomy  2005  . Appendectomy       Social History:  The patient  reports that she has never smoked. She does not have any smokeless tobacco history on file. She reports that she does not drink alcohol or use illicit drugs.   Family History:  The patient's family history includes Breast cancer in her sister; Coronary artery disease in her brother; Diabetes in her sister; Heart attack (age of onset: 1365) in her father; Heart attack (age of onset: 5583) in her mother; Heart disease in an other family member.    ROS:  Please see the  history of present illness. All other systems are reviewed and  Negative to the above problem except as noted.    PHYSICAL EXAM: VS:  BP 130/70 mmHg  Pulse 73  Ht  (1.626 m)  Wt 64.774 kg (142 lb 12.8 oz)  BMI 24.50 kg/m2  SpO2 99%  GEN: Well nourished, well developed, in no acute distress HEENT: normal Neck: no JVD, carotid bruits, or masses Cardiac: RRR; no murmurs, rubs, or gallops,no edema  Respiratory:  clear to auscultation bilaterally, normal work of breathing GI: soft, nontender, nondistended, + BS  No hepatomegaly  MS: no deformity Moving all extremities   Skin: warm and dry, no rash Neuro:  Strength and sensation are intact Psych: euthymic mood, full affect   EKG:  EKG is not ordered today.   Lipid Panel    Component Value Date/Time   CHOL 145 08/05/2007 0920   TRIG 110 08/05/2007 0920   HDL 52.9 08/05/2007 0920   CHOLHDL 2.7 CALC  08/05/2007 0920   VLDL 22 08/05/2007 0920   LDLCALC 70 08/05/2007 0920      Wt Readings from Last 3 Encounters:  01/11/15 64.774 kg (142 lb 12.8 oz)  01/19/14 69.4 kg (153 lb)  12/19/13 68.856 kg (151 lb 12.8 oz)      ASSESSMENT AND PLAN:  1.  CAD  No symptoms to sugg angina.  2.  HTN  Adequate control  3.  HL  Keep on lipitor  Get labs from Dr [employer] office    Signed, Dietrich Pates, MD  01/11/2015 10:38 AM    [facility] [address] [place], [place], [place]  [address] Phone: [phone]; Fax: [phone]

## 2015-01-23 ENCOUNTER — Other Ambulatory Visit: Payer: Self-pay | Admitting: Internal Medicine

## 2015-01-31 ENCOUNTER — Other Ambulatory Visit: Payer: Self-pay | Admitting: Internal Medicine

## 2015-03-21 ENCOUNTER — Other Ambulatory Visit: Payer: Self-pay | Admitting: Internal Medicine

## 2015-05-31 ENCOUNTER — Encounter (HOSPITAL_COMMUNITY): Payer: Self-pay

## 2015-05-31 ENCOUNTER — Ambulatory Visit (HOSPITAL_COMMUNITY)
Admission: RE | Admit: 2015-05-31 | Discharge: 2015-05-31 | Disposition: A | Discharge status: Home / Self Care | Payer: Medicare Other | Source: Ambulatory Visit | Attending: Internal Medicine | Admitting: Internal Medicine

## 2015-05-31 DIAGNOSIS — M810 Age-related osteoporosis without current pathological fracture: Secondary | ICD-10-CM | POA: Diagnosis present

## 2015-05-31 MED ORDER — DENOSUMAB 60 MG/ML ~~LOC~~ SOLN
60.0000 mg | Freq: Once | SUBCUTANEOUS | Status: AC
  Administered 2015-05-31: 60 mg via SUBCUTANEOUS
  Filled 2015-05-31: qty 1

## 2015-05-31 NOTE — Discharge Instructions (Signed)
Prolia °Denosumab injection °What is this medicine? °DENOSUMAB (den oh sue mab) slows bone breakdown. Prolia is used to treat osteoporosis in women after menopause and in men. Xgeva is used to prevent bone fractures and other bone problems caused by cancer bone metastases. Xgeva is also used to treat giant cell tumor of the bone. °This medicine may be used for other purposes; ask your health care provider or pharmacist if you have questions. °What should I tell my health care provider before I take this medicine? °They need to know if you have any of these conditions: °-dental disease °-eczema °-infection or history of infections °-kidney disease or on dialysis °-low blood calcium or vitamin D °-malabsorption syndrome °-scheduled to have surgery or tooth extraction °-taking medicine that contains denosumab °-thyroid or parathyroid disease °-an unusual reaction to denosumab, other medicines, foods, dyes, or preservatives °-pregnant or trying to get pregnant °-breast-feeding °How should I use this medicine? °This medicine is for injection under the skin. It is given by a health care professional in a hospital or clinic setting. °If you are getting Prolia, a special MedGuide will be given to you by the pharmacist with each prescription and refill. Be sure to read this information carefully each time. °For Prolia, talk to your pediatrician regarding the use of this medicine in children. Special care may be needed. For Xgeva, talk to your pediatrician regarding the use of this medicine in children. While this drug may be prescribed for children as young as 13 years for selected conditions, precautions do apply. °Overdosage: If you think you have taken too much of this medicine contact a poison control center or emergency room at once. °NOTE: This medicine is only for you. Do not share this medicine with others. °What if I miss a dose? °It is important not to miss your dose. Call your doctor or health care professional  if you are unable to keep an appointment. °What may interact with this medicine? °Do not take this medicine with any of the following medications: °-other medicines containing denosumab °This medicine may also interact with the following medications: °-medicines that suppress the immune system °-medicines that treat cancer °-steroid medicines like prednisone or cortisone °This list may not describe all possible interactions. Give your health care provider a list of all the medicines, herbs, non-prescription drugs, or dietary supplements you use. Also tell them if you smoke, drink alcohol, or use illegal drugs. Some items may interact with your medicine. °What should I watch for while using this medicine? °Visit your doctor or health care professional for regular checks on your progress. Your doctor or health care professional may order blood tests and other tests to see how you are doing. °Call your doctor or health care professional if you get a cold or other infection while receiving this medicine. Do not treat yourself. This medicine may decrease your body's ability to fight infection. °You should make sure you get enough calcium and vitamin D while you are taking this medicine, unless your doctor tells you not to. Discuss the foods you eat and the vitamins you take with your health care professional. °See your dentist regularly. Brush and floss your teeth as directed. Before you have any dental work done, tell your dentist you are receiving this medicine. °Do not become pregnant while taking this medicine or for 5 months after stopping it. Women should inform their doctor if they wish to become pregnant or think they might be pregnant. There is a potential for serious side   effects to an unborn child. Talk to your health care professional or pharmacist for more information. °What side effects may I notice from receiving this medicine? °Side effects that you should report to your doctor or health care professional  as soon as possible: °-allergic reactions like skin rash, itching or hives, swelling of the face, lips, or tongue °-breathing problems °-chest pain °-fast, irregular heartbeat °-feeling faint or lightheaded, falls °-fever, chills, or any other sign of infection °-muscle spasms, tightening, or twitches °-numbness or tingling °-skin blisters or bumps, or is dry, peels, or red °-slow healing or unexplained pain in the mouth or jaw °-unusual bleeding or bruising °Side effects that usually do not require medical attention (Report these to your doctor or health care professional if they continue or are bothersome.): °-muscle pain °-stomach upset, gas °This list may not describe all possible side effects. Call your doctor for medical advice about side effects. You may report side effects to FDA at 1-800-FDA-1088. °Where should I keep my medicine? °This medicine is only given in a clinic, doctor's office, or other health care setting and will not be stored at home. °NOTE: This sheet is a summary. It may not cover all possible information. If you have questions about this medicine, talk to your doctor, pharmacist, or health care provider. °  °© 2016, [employer]. (2011-08-14 12:37:47) ° °

## 2015-06-21 ENCOUNTER — Other Ambulatory Visit: Payer: Self-pay | Admitting: Internal Medicine

## 2015-06-22 ENCOUNTER — Other Ambulatory Visit: Payer: Self-pay | Admitting: Internal Medicine

## 2015-07-29 ENCOUNTER — Other Ambulatory Visit: Payer: Self-pay | Admitting: Internal Medicine

## 2015-07-29 NOTE — Telephone Encounter (Signed)
Please advise on request as there is not a recent lipid panel in epic. Thanks, MI 

## 2015-08-11 ENCOUNTER — Encounter: Payer: Self-pay | Admitting: Internal Medicine

## 2015-08-26 ENCOUNTER — Other Ambulatory Visit: Payer: Self-pay

## 2015-08-27 NOTE — Telephone Encounter (Signed)
I spoke with this patient. Her cholesterol was checked last week at PCP (Dr. Jarold MottoPatterson) [facility] Dr. Norval GablePatterson's office.  Pt reports her legs are aching badly and would like to decrease the dose of cholesterol medication if possible. I advised her to stop atorvastatin for 1-2 weeks and see if her legs feel better.  I asked her to call back to let us know and at that time, after her labs are received, we can talk to Dr. Tenny Crawoss about a dose decrease or change of medication.  She verbalizes understanding and appreciation.

## 2015-08-27 NOTE — Telephone Encounter (Signed)
OK to refill

## 2015-08-30 ENCOUNTER — Other Ambulatory Visit: Payer: Self-pay | Admitting: *Deleted

## 2015-08-30 MED ORDER — ATORVASTATIN CALCIUM 80 MG PO TABS: 80.0000 mg | ORAL_TABLET | Freq: Every evening | ORAL | Status: DC

## 2015-09-02 ENCOUNTER — Encounter: Payer: Self-pay | Admitting: Internal Medicine

## 2015-11-26 ENCOUNTER — Other Ambulatory Visit: Payer: Self-pay | Admitting: Internal Medicine

## 2015-12-06 ENCOUNTER — Observation Stay (HOSPITAL_COMMUNITY)
Admission: EM | Admit: 2015-12-06 | Discharge: 2015-12-07 | Disposition: A | Discharge status: Home / Self Care | Payer: Medicare Other | Attending: Internal Medicine | Admitting: Internal Medicine

## 2015-12-06 ENCOUNTER — Encounter (HOSPITAL_COMMUNITY): Payer: Self-pay | Admitting: Emergency Medicine

## 2015-12-06 ENCOUNTER — Emergency Department (HOSPITAL_COMMUNITY): Payer: Medicare Other

## 2015-12-06 DIAGNOSIS — Z7982 Long term (current) use of aspirin: Secondary | ICD-10-CM | POA: Diagnosis not present

## 2015-12-06 DIAGNOSIS — Z951 Presence of aortocoronary bypass graft: Secondary | ICD-10-CM

## 2015-12-06 DIAGNOSIS — E782 Mixed hyperlipidemia: Secondary | ICD-10-CM | POA: Diagnosis present

## 2015-12-06 DIAGNOSIS — I2510 Atherosclerotic heart disease of native coronary artery without angina pectoris: Secondary | ICD-10-CM | POA: Insufficient documentation

## 2015-12-06 DIAGNOSIS — I10 Essential (primary) hypertension: Secondary | ICD-10-CM | POA: Diagnosis not present

## 2015-12-06 DIAGNOSIS — E119 Type 2 diabetes mellitus without complications: Secondary | ICD-10-CM | POA: Insufficient documentation

## 2015-12-06 DIAGNOSIS — E039 Hypothyroidism, unspecified: Secondary | ICD-10-CM | POA: Insufficient documentation

## 2015-12-06 DIAGNOSIS — R071 Chest pain on breathing: Principal | ICD-10-CM | POA: Diagnosis present

## 2015-12-06 DIAGNOSIS — R079 Chest pain, unspecified: Secondary | ICD-10-CM | POA: Diagnosis not present

## 2015-12-06 DIAGNOSIS — E7800 Pure hypercholesterolemia, unspecified: Secondary | ICD-10-CM | POA: Diagnosis not present

## 2015-12-06 DIAGNOSIS — Z96652 Presence of left artificial knee joint: Secondary | ICD-10-CM | POA: Insufficient documentation

## 2015-12-06 LAB — BASIC METABOLIC PANEL
Anion gap: 8 (ref 5–15)
BUN: 10 mg/dL (ref 6–20)
CALCIUM: 9.4 mg/dL (ref 8.9–10.3)
CO2: 25 mmol/L (ref 22–32)
CREATININE: 0.83 mg/dL (ref 0.44–1.00)
Chloride: 99 mmol/L — ABNORMAL LOW (ref 101–111)
GFR calc non Af Amer: >60 mL/min (ref >60)
Glucose, Bld: 128 mg/dL — ABNORMAL HIGH (ref 65–99)
Potassium: 4 mmol/L (ref 3.5–5.1)
SODIUM: 132 mmol/L — AB (ref 135–145)

## 2015-12-06 LAB — CBC
HCT: 36.8 % (ref 36.0–46.0)
Hemoglobin: 12.9 g/dL (ref 12.0–15.0)
MCH: 32.6 pg (ref 26.0–34.0)
MCHC: 35.1 g/dL (ref 30.0–36.0)
MCV: 92.9 fL (ref 78.0–100.0)
PLATELETS: 146 10*3/uL — AB (ref 150–400)
RBC: 3.96 MIL/uL (ref 3.87–5.11)
RDW: 12.7 % (ref 11.5–15.5)
WBC: 7.3 10*3/uL (ref 4.0–10.5)

## 2015-12-06 LAB — TROPONIN I

## 2015-12-06 LAB — I-STAT TROPONIN, ED: TROPONIN I, POC: 0.01 ng/mL (ref 0.00–0.08)

## 2015-12-06 LAB — GLUCOSE, CAPILLARY: GLUCOSE-CAPILLARY: 166 mg/dL — AB (ref 65–99)

## 2015-12-06 MED ORDER — LISINOPRIL 5 MG PO TABS
5.0000 mg | ORAL_TABLET | Freq: Every day | ORAL | Status: DC
  Administered 2015-12-07: 5 mg via ORAL
  Filled 2015-12-06: qty 1

## 2015-12-06 MED ORDER — AMLODIPINE BESYLATE 10 MG PO TABS
10.0000 mg | ORAL_TABLET | Freq: Every day | ORAL | Status: DC
  Administered 2015-12-07: 10 mg via ORAL
  Filled 2015-12-06: qty 1

## 2015-12-06 MED ORDER — ASPIRIN 300 MG RE SUPP: 300.0000 mg | RECTAL | Status: DC

## 2015-12-06 MED ORDER — POTASSIUM CHLORIDE CRYS ER 20 MEQ PO TBCR
20.0000 meq | EXTENDED_RELEASE_TABLET | Freq: Every day | ORAL | Status: DC
  Administered 2015-12-07: 20 meq via ORAL
  Filled 2015-12-06: qty 1

## 2015-12-06 MED ORDER — ACETAMINOPHEN 325 MG PO TABS
650.0000 mg | ORAL_TABLET | ORAL | Status: DC | PRN
Start: 2015-12-06 — End: 2015-12-07

## 2015-12-06 MED ORDER — ASPIRIN 81 MG PO CHEW: 324.0000 mg | CHEWABLE_TABLET | ORAL | Status: DC

## 2015-12-06 MED ORDER — HEPARIN SODIUM (PORCINE) 5000 UNIT/ML IJ SOLN
5000.0000 [IU] | Freq: Three times a day (TID) | INTRAMUSCULAR | Status: DC
  Filled 2015-12-06: qty 1

## 2015-12-06 MED ORDER — METOPROLOL TARTRATE 25 MG PO TABS
25.0000 mg | ORAL_TABLET | Freq: Two times a day (BID) | ORAL | Status: DC
  Administered 2015-12-06: 25 mg via ORAL
  Filled 2015-12-06: qty 1

## 2015-12-06 MED ORDER — ASPIRIN EC 81 MG PO TBEC
81.0000 mg | DELAYED_RELEASE_TABLET | Freq: Every day | ORAL | Status: DC
  Administered 2015-12-07: 81 mg via ORAL
  Filled 2015-12-06: qty 1

## 2015-12-06 MED ORDER — LEVOTHYROXINE SODIUM 75 MCG PO TABS
75.0000 ug | ORAL_TABLET | Freq: Every day | ORAL | Status: DC
  Administered 2015-12-07: 75 ug via ORAL
  Filled 2015-12-06: qty 1

## 2015-12-06 MED ORDER — INSULIN ASPART 100 UNIT/ML ~~LOC~~ SOLN
0.0000 [IU] | Freq: Three times a day (TID) | SUBCUTANEOUS | Status: DC
  Administered 2015-12-07: 2 [IU] via SUBCUTANEOUS

## 2015-12-06 MED ORDER — ONDANSETRON HCL 4 MG/2ML IJ SOLN: 4.0000 mg | Freq: Four times a day (QID) | INTRAMUSCULAR | Status: DC | PRN

## 2015-12-06 MED ORDER — NITROGLYCERIN 0.4 MG SL SUBL: 0.4000 mg | SUBLINGUAL_TABLET | SUBLINGUAL | Status: DC | PRN

## 2015-12-06 MED ORDER — FUROSEMIDE 20 MG PO TABS: 20.0000 mg | ORAL_TABLET | Freq: Every day | ORAL | Status: DC

## 2015-12-06 MED ORDER — ATORVASTATIN CALCIUM 80 MG PO TABS
80.0000 mg | ORAL_TABLET | Freq: Every evening | ORAL | Status: DC
  Administered 2015-12-06: 80 mg via ORAL
  Filled 2015-12-06: qty 1

## 2015-12-06 MED ORDER — ASPIRIN 81 MG PO TABS: 81.0000 mg | ORAL_TABLET | Freq: Every day | ORAL | Status: DC

## 2015-12-06 NOTE — ED Provider Notes (Signed)
Cardiology evaluated pt and will admit for further evaluation and testing   Elson AreasLeslie K Rayane Gallardo, PA-C 12/06/15 1712    Laurence Spatesachel Morgan Little, MD 12/08/15 [phone]

## 2015-12-06 NOTE — H&P (Signed)
Cardiology Consult    Patient ID: [name] MRN: [id number], DOB/AGE: [birth year]   Admit date: 12/06/2015 Date of Consult: 12/06/2015  Primary Physician: Garlan Fillers, MD Reason for Consult: Chest pain  Primary Cardiologist: Dr. Tenny Craw Requesting Provider: Dr. Corlis Leak  Patient Profile    [name] is a 83 year old female with a past medical history of CAD s/p CABG x 3 in 2000, DM, HTN, HLD, and SVT s/p ablation. She presented with chest pain while on her morning walk.   History of Present Illness    [name] was walking this morning when she developed chest pain and SOB when she got to the top of a hill. She walks every morning and has noticed over the past week she has been usually fatigued on her morning walks. This morning, she developed chest pain that she describes as substernal squeezing with associated jaw pain. Her breathing became very labored and she walked back home and called her sister who advised her to call 911. Her chest pain subsided when she was able to sit down and rest. Denies radiation of pain. Denies nausea and diaphoresis. Chest pain was not reproducible on palpation or exacerbated with deep inspiration.   Upon arrival to the ED her EKG showed NSR with 1st degree AVB, early R wave progression. Her troponin was negative x 1. She is chest pain free at the time of my encounter. When asked about resting angina she says that there were times over the past month when she was watching TV when she would notice chest pain, but says the pain was worsened with palpation and movement.   Ms. [name] is not a smoker, denies ETOH use. She has a strong family history of CAD, with her mother dying of a MI when she was 36. Also her father died of MI at age 30, and she has multiple brothers and sisters, all whom have CAD with stents.   Past Medical History   Past Medical History:  Diagnosis Date  . Coronary artery disease   . Diabetes mellitus   . DJD (degenerative  joint disease)   . Edema   . GERD (gastroesophageal reflux disease)   . Hiatal hernia   . Hyperlipidemia   . Hypertension   . Hypothyroidism   . IBS (irritable bowel syndrome)   . SVT (supraventricular tachycardia) (HCC)    with ablation    Past Surgical History:  Procedure Laterality Date  . APPENDECTOMY    . CHOLECYSTECTOMY  2005  . CORONARY ARTERY BYPASS GRAFT  2000  . REPLACEMENT TOTAL KNEE  2009   left     Allergies  Allergies  Allergen Reactions  . Actos [Pioglitazone Hydrochloride] Swelling    Edema.  . Codeine     Visual hallucinations.     Inpatient Medications      Family History    Family History  Problem Relation Age of Onset  . Heart attack Mother 61    died  . Heart attack Father 39    died  . Breast cancer Sister   . Diabetes Sister     and brother  . Heart disease      sister, father and brother  . Coronary artery disease Brother     PCI    Social History    Social History   Social History  . Marital status: Single    Spouse name: N/A  . Number of children: N/A  . Years of education: N/A  Occupational History  . retired Retired   Social History Main Topics  . Smoking status: Never Smoker  . Smokeless tobacco: Never Used  . Alcohol use No  . Drug use: No  . Sexual activity: Not on file   Other Topics Concern  . Not on file   Social History Narrative  . No narrative on file     Review of Systems    General:  No chills, fever, night sweats or weight changes.  Cardiovascular:  + chest pain, dyspnea on exertion, edema, orthopnea, palpitations, paroxysmal nocturnal dyspnea. Dermatological: No rash, lesions/masses Respiratory: No cough, dyspnea Urologic: No hematuria, dysuria Abdominal:   No nausea, vomiting, diarrhea, bright red blood per rectum, melena, or hematemesis Neurologic:  No visual changes, wkns, changes in mental status. All other systems reviewed and are otherwise negative except as noted above.  Physical  Exam    Blood pressure 135/61, pulse 60, temperature 97.5 F (36.4 C), temperature source Oral, resp. rate 17, height 5\' 4"  (1.626 m), weight 142 lb (64.4 kg), SpO2 95 %.  General: Pleasant, NAD Psych: Normal affect. Neuro: Alert and oriented X 3. Moves all extremities spontaneously. HEENT: Normal  Neck: Supple without bruits or JVD. Lungs:  Resp regular and unlabored, CTA. Heart: RRR no s3, s4, or murmurs. Abdomen: Soft, non-tender, non-distended, BS + x 4.  Extremities: No clubbing, cyanosis or edema. DP/PT/Radials 2+ and equal bilaterally.  Labs    Troponin [facility](Point of Care Test)  Recent Labs  12/06/15 1053  TROPIPOC 0.01   Lab Results  Component Value Date   WBC 7.3 12/06/2015   HGB 12.9 12/06/2015   HCT 36.8 12/06/2015   MCV 92.9 12/06/2015   PLT 146 (L) 12/06/2015    Recent Labs Lab 12/06/15 1036  NA 132*  K 4.0  CL 99*  CO2 25  BUN 10  CREATININE 0.83  CALCIUM 9.4  GLUCOSE 128*    Radiology Studies    Dg Chest 2 View  Result Date: 12/06/2015 CLINICAL DATA:  Chest pain EXAM: CHEST  2 VIEW COMPARISON:  Chest radiograph December 01, 2014 and chest CT December 01, 2014 FINDINGS: There is no edema or consolidation. Heart is upper normal in size with pulmonary vascularity within normal limits. Patient is status post coronary artery bypass grafting. There is atherosclerotic calcification in the aorta. No adenopathy evident. No pneumothorax. No bone lesions. IMPRESSION: Aortic atherosclerosis. No edema or consolidation. Stable cardiac silhouette. Electronically Signed   By: Bretta BangWilliam  Woodruff III M.D.   On: 12/06/2015 10:17    EKG & Cardiac Imaging    EKG: NSR  Assessment & Plan   1. Chest pain with a history of CAD s/p CABG: Presents with chest pain that occurred with exercise, resolved with rest, but does describe some resting angina. Troponin negative so far. Last heart cath was in 2006, had patent IMA to LAD, patent SVG to OM, patent SVG to PDA.   She did have a  nonischemic nuclear perfusion study in Oct. 2013.   Will follow enzymes. Symptoms are concerning, will need further evaluation with treadmill Myoview. Can do outpatient, further MD recommendations to follow.   2. HTN: Well controlled on ACE-I, metoprolol and amlodipine.  3. HLD: Continue high intensity statin.   4. Hypothyroidism: Continue synthroid.   Signed, Little IshikawaErin E Avien Taha, NP 12/06/2015, 2:26 PM Pager: [phone]

## 2015-12-06 NOTE — ED Provider Notes (Signed)
[facility]-EMERGENCY DEPT Provider Note   CSN: [id number] Arrival date & time: 12/06/15  8295     History   Chief Complaint Chief Complaint  Patient presents with  . Chest Pain    HPI [name] is a 82 y.o. female.  The history is provided by the patient and medical records.    82 year old female with history of coronary artery disease status post CABG 4, diabetes, degenerative disc disease, GERD, hyperlipidemia, hypertension, hyperthyroidism, history of SVT status post ablation, presenting to the ED for chest pain and shortness of breath.  Patient states she walks daily for exercise, usually in the morning. States over the past few days when doing this she has noticed when she starts to go up hill she developed some chest tightness and labored breathing. States this is unusual for her. States today, symptoms are more severe than previous. States she made it back to her house was able to sit down and rest and symptoms improved.  States she did notice that her blood pressure was elevated so she took half of one of her "anxiety pills". With some improvement. States she does not feel anxious or overwhelmed currently. She is not noticed any leg swelling. No fever, cough, or other URI symptoms.  States she has noticed some fatigue that has developed over the past few days as well.  Patient takes daily ASA which she took this morning.  Was given additional ASA with EMS.  Followed by cardiology, Dr. Tenny Craw.  Past Medical History:  Diagnosis Date  . Coronary artery disease   . Diabetes mellitus   . DJD (degenerative joint disease)   . Edema   . GERD (gastroesophageal reflux disease)   . Hiatal hernia   . Hyperlipidemia   . Hypertension   . Hypothyroidism   . IBS (irritable bowel syndrome)   . SVT (supraventricular tachycardia) (HCC)    with ablation    Patient Active Problem List   Diagnosis Date Noted  . COUGH 06/24/2009  . HYPOPOTASSEMIA 12/31/2008  . PURE HYPERCHOLESTEROLEMIA  12/16/2008  . HYPERLIPIDEMIA-MIXED 08/28/2008  . HYPERTENSION, BENIGN 08/28/2008  . CAD 08/28/2008  . GERD 10/16/2007    Past Surgical History:  Procedure Laterality Date  . APPENDECTOMY    . CHOLECYSTECTOMY  2005  . CORONARY ARTERY BYPASS GRAFT  2000  . REPLACEMENT TOTAL KNEE  2009   left    OB History    No data available       Home Medications    Prior to Admission medications   Medication Sig Start Date End Date Taking? Authorizing Provider  acetaminophen (TYLENOL) 500 MG tablet Take 1,000 mg by mouth 2 (two) times daily as needed for mild pain or moderate pain.     Historical Provider, MD  amLODipine (NORVASC) 10 MG tablet TAKE 1 TABLET BY MOUTH EVERY DAY 02/01/15   Pricilla Riffle, MD  aspirin 81 MG tablet Take 81 mg by mouth daily.      Historical Provider, MD  atorvastatin (LIPITOR) 80 MG tablet Take 1 tablet (80 mg total) by mouth every evening. 08/30/15   Pricilla Riffle, MD  calcium carbonate (TUMS - DOSED IN MG ELEMENTAL CALCIUM) 500 MG chewable tablet Chew 1 tablet by mouth as needed for indigestion or heartburn.     Historical Provider, MD  ergocalciferol (VITAMIN D2) 50000 UNITS capsule Take 50,000 Units by mouth once a week.      Historical Provider, MD  esomeprazole (NEXIUM) 20 MG capsule Take 20 mg  by mouth daily before breakfast.    Historical Provider, MD  furosemide (LASIX) 20 MG tablet TAKE 1 TABLET BY MOUTH EVERY DAY 11/26/15   Pricilla RifflePaula V Ross, MD  KLOR-CON M20 20 MEQ tablet TAKE 1 TABLET BY MOUTH EVERY DAY 02/01/15   Pricilla RifflePaula V Ross, MD  levothyroxine (SYNTHROID, LEVOTHROID) 75 MCG tablet Take 75 mcg by mouth daily.      Historical Provider, MD  lisinopril (PRINIVIL,ZESTRIL) 5 MG tablet TAKE 1 TABLET BY MOUTH EVERY DAY Patient taking differently: Take 1 tablet by mouth every day. 12/19/11   Wendall StadePeter C Nishan, MD  metoprolol (LOPRESSOR) 25 MG tablet Take 1 tablet (25 mg total) by mouth 2 (two) times daily. 03/22/15   Pricilla RifflePaula V Ross, MD  metoprolol (LOPRESSOR) 50 MG tablet TAKE  1 TABLET BY MOUTH TWICE A DAY 06/22/15   Pricilla RifflePaula V Ross, MD    Family History Family History  Problem Relation Age of Onset  . Heart attack Mother 6583    died  . Heart attack Father 2265    died  . Breast cancer Sister   . Diabetes Sister     and brother  . Heart disease      sister, father and brother  . Coronary artery disease Brother     PCI    Social History Social History  Substance Use Topics  . Smoking status: Never Smoker  . Smokeless tobacco: Never Used  . Alcohol use No     Allergies   Actos [pioglitazone hydrochloride] and Codeine   Review of Systems Review of Systems  Respiratory: Positive for shortness of breath.   Cardiovascular: Positive for chest pain.  All other systems reviewed and are negative.    Physical Exam Updated Vital Signs BP 138/59 (BP Location: Right Arm)   Pulse 67   Temp 97.5 F (36.4 C) (Oral)   Resp 14   Ht 5\' 4"  (1.626 m)   Wt 64.4 kg   SpO2 100%   BMI 24.37 kg/m   Physical Exam  Constitutional: She is oriented to person, place, and time. She appears well-developed and well-nourished.  HENT:  Head: Normocephalic and atraumatic.  Mouth/Throat: Oropharynx is clear and moist.  Eyes: Conjunctivae and EOM are normal. Pupils are equal, round, and reactive to light.  Neck: Normal range of motion.  Cardiovascular: Normal rate, regular rhythm and normal heart sounds.   Pulmonary/Chest: Effort normal and breath sounds normal. No respiratory distress. She has no wheezes.  Abdominal: Soft. Bowel sounds are normal.  Musculoskeletal: Normal range of motion.  Trace edema bilateral ankles  Neurological: She is alert and oriented to person, place, and time.  Skin: Skin is warm and dry.  Psychiatric: She has a normal mood and affect.  Nursing note and vitals reviewed.    ED Treatments / Results  Labs (all labs ordered are listed, but only abnormal results are displayed) Labs Reviewed  BASIC METABOLIC PANEL - Abnormal; Notable for  the following:       Result Value   Sodium 132 (*)    Chloride 99 (*)    Glucose, Bld 128 (*)    All other components within normal limits  CBC - Abnormal; Notable for the following:    Platelets 146 (*)    All other components within normal limits  I-STAT TROPOININ, ED    EKG  EKG Interpretation None       Radiology Dg Chest 2 View  Result Date: 12/06/2015 CLINICAL DATA:  Chest pain EXAM: CHEST  2 VIEW COMPARISON:  Chest radiograph December 01, 2014 and chest CT December 01, 2014 FINDINGS: There is no edema or consolidation. Heart is upper normal in size with pulmonary vascularity within normal limits. Patient is status post coronary artery bypass grafting. There is atherosclerotic calcification in the aorta. No adenopathy evident. No pneumothorax. No bone lesions. IMPRESSION: Aortic atherosclerosis. No edema or consolidation. Stable cardiac silhouette. Electronically Signed   By: Bretta Bang III M.D.   On: 12/06/2015 10:17    Procedures Procedures (including critical care time)  Medications Ordered in ED Medications - No data to display   Initial Impression / Assessment and Plan / ED Course  I have reviewed the triage vital signs and the nursing notes.  Pertinent labs & imaging results that were available during my care of the patient were reviewed by me and considered in my medical decision making (see chart for details).  Clinical Course   82 year old female here with new exertional chest pain when walking. This is been a recent development of the past few days. Symptoms earlier this morning resolved with rest. She denies any pain or shortness of breath on arrival to the ED. EKG today without any acute ischemia. Lab work is reassuring. Chest x-ray is clear. Patient remains pain-free here. Given her new exertional chest pain and her known cardiac hx, feel she would benefit from cardiology evaluation.  Spoke with [facility], will evaluate patient in the ED and provide  recommendations.  3:19 PM Cardiology eval pending.  Care signed out to oncoming provider.  Final Clinical Impressions(s) / ED Diagnoses   Final diagnoses:  Exertional chest pain    New Prescriptions New Prescriptions   No medications on file     Garlon Hatchet, PA-C 12/06/15 1520    Courteney Lyn Mackuen, MD 12/07/15 1014

## 2015-12-06 NOTE — ED Triage Notes (Signed)
Pt was taking her daily walk when [facility] became [facility].  Pt turned around and went home where she rested and reports the pain and SHOB improved.  Upon EMS arrival pt was pain free.  Pt given 324mg  ASA PTA.

## 2015-12-06 NOTE — Progress Notes (Signed)
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 Patient is asymptomatic with rhythm. On chart review, this is a known issue since at least as far back as 2015, per EKG. Strip posted to chart per central telemetry.  Continuing to monitor.

## 2015-12-07 ENCOUNTER — Ambulatory Visit (HOSPITAL_COMMUNITY): Admission: RE | Admit: 2015-12-07 | Payer: Medicare Other | Source: Ambulatory Visit

## 2015-12-07 ENCOUNTER — Observation Stay (HOSPITAL_COMMUNITY): Payer: Medicare Other

## 2015-12-07 ENCOUNTER — Observation Stay (HOSPITAL_BASED_OUTPATIENT_CLINIC_OR_DEPARTMENT_OTHER): Payer: Medicare Other

## 2015-12-07 ENCOUNTER — Other Ambulatory Visit (HOSPITAL_COMMUNITY): Payer: Self-pay | Admitting: Internal Medicine

## 2015-12-07 DIAGNOSIS — I10 Essential (primary) hypertension: Secondary | ICD-10-CM | POA: Diagnosis not present

## 2015-12-07 DIAGNOSIS — R079 Chest pain, unspecified: Secondary | ICD-10-CM | POA: Diagnosis not present

## 2015-12-07 DIAGNOSIS — R071 Chest pain on breathing: Secondary | ICD-10-CM | POA: Diagnosis not present

## 2015-12-07 DIAGNOSIS — E119 Type 2 diabetes mellitus without complications: Secondary | ICD-10-CM | POA: Diagnosis not present

## 2015-12-07 DIAGNOSIS — E039 Hypothyroidism, unspecified: Secondary | ICD-10-CM | POA: Diagnosis not present

## 2015-12-07 DIAGNOSIS — Z951 Presence of aortocoronary bypass graft: Secondary | ICD-10-CM | POA: Diagnosis not present

## 2015-12-07 LAB — CBC
HCT: 36.7 % (ref 36.0–46.0)
Hemoglobin: 12.7 g/dL (ref 12.0–15.0)
MCH: 32.5 pg (ref 26.0–34.0)
MCHC: 34.6 g/dL (ref 30.0–36.0)
MCV: 93.9 fL (ref 78.0–100.0)
PLATELETS: 149 10*3/uL — AB (ref 150–400)
RBC: 3.91 MIL/uL (ref 3.87–5.11)
RDW: 13 % (ref 11.5–15.5)
WBC: 7.5 10*3/uL (ref 4.0–10.5)

## 2015-12-07 LAB — NM MYOCAR MULTI W/SPECT W/WALL MOTION / EF
Exercise duration (min): 0 min
Exercise duration (sec): 0 s
LV dias vol: 53 mL (ref 46–106)
LV sys vol: 11 mL
MPHR: 140 {beats}/min
Percent HR: 75 %
RATE: 0
Rest HR: 65 {beats}/min
SDS: 1
SRS: 0
SSS: 1
Stage 1 Grade: 0 %
Stage 1 HR: 67 {beats}/min
Stage 1 Speed: 0 mph
Stage 2 Grade: 0 %
Stage 2 HR: 67 {beats}/min
Stage 2 Speed: 0 mph
Stage 3 DBP: 64 mmHg
Stage 3 Grade: 0 %
Stage 3 HR: 97 {beats}/min
Stage 3 SBP: 160 mmHg
Stage 3 Speed: 0 mph
Stage 4 DBP: 56 mmHg
Stage 4 Grade: 0 %
Stage 4 HR: 92 {beats}/min
Stage 4 SBP: 147 mmHg
Stage 4 Speed: 0 mph
TID: 0.95

## 2015-12-07 LAB — GLUCOSE, CAPILLARY
GLUCOSE-CAPILLARY: 141 mg/dL — AB (ref 65–99)
Glucose-Capillary: 181 mg/dL — ABNORMAL HIGH (ref 65–99)
Glucose-Capillary: 95 mg/dL (ref 65–99)

## 2015-12-07 LAB — BASIC METABOLIC PANEL
Anion gap: 8 (ref 5–15)
BUN: 10 mg/dL (ref 6–20)
CO2: 26 mmol/L (ref 22–32)
Calcium: 9.1 mg/dL (ref 8.9–10.3)
Chloride: 100 mmol/L — ABNORMAL LOW (ref 101–111)
Creatinine, Ser: 0.75 mg/dL (ref 0.44–1.00)
GFR calc Af Amer: >60 mL/min (ref >60)
GFR calc non Af Amer: >60 mL/min (ref >60)
Glucose, Bld: 102 mg/dL — ABNORMAL HIGH (ref 65–99)
Potassium: 3.5 mmol/L (ref 3.5–5.1)
Sodium: 134 mmol/L — ABNORMAL LOW (ref 135–145)

## 2015-12-07 LAB — LIPID PANEL
Cholesterol: 137 mg/dL (ref 0–200)
HDL: 52 mg/dL (ref >40)
LDL Cholesterol: 61 mg/dL (ref 0–99)
Total CHOL/HDL Ratio: 2.6 RATIO
Triglycerides: 119 mg/dL (ref <150)
VLDL: 24 mg/dL (ref 0–40)

## 2015-12-07 MED ORDER — REGADENOSON 0.4 MG/5ML IV SOLN
0.4000 mg | Freq: Once | INTRAVENOUS | Status: AC
  Administered 2015-12-07: 0.4 mg via INTRAVENOUS

## 2015-12-07 MED ORDER — TECHNETIUM TC 99M TETROFOSMIN IV KIT
30.0000 | PACK | Freq: Once | INTRAVENOUS | Status: AC | PRN
  Administered 2015-12-07: 30 via INTRAVENOUS

## 2015-12-07 MED ORDER — REGADENOSON 0.4 MG/5ML IV SOLN
INTRAVENOUS | Status: AC
  Filled 2015-12-07: qty 5

## 2015-12-07 MED ORDER — NITROGLYCERIN 0.4 MG SL SUBL
0.4000 mg | SUBLINGUAL_TABLET | SUBLINGUAL | 2 refills | Status: AC | PRN
Start: 2015-12-07 — End: ?

## 2015-12-07 MED ORDER — TECHNETIUM TC 99M TETROFOSMIN IV KIT
10.0000 | PACK | Freq: Once | INTRAVENOUS | Status: AC | PRN
  Administered 2015-12-07: 10 via INTRAVENOUS

## 2015-12-07 NOTE — Discharge Summary (Signed)
Discharge Summary    Patient ID: [name],  MRN: [id number], DOB/AGE: 1932-07-07 82 y.o.  Admit date: 12/06/2015 Discharge date: 12/07/2015  Primary Care Provider: Garlan FillersPATERSON,DANIEL G Primary Cardiologist: Dr. Tenny Crawoss  Discharge Diagnoses    Active Problems:   Pure hypercholesterolemia   Hyperlipidemia, mixed   HYPERTENSION, BENIGN   S/P CABG x 3   Chest pain on breathing   History of Present Illness     [name] is a 82 y.o. female with past medical history of CAD (s/p CABG x 3in 2000, cath in 2006 showed patent LIMA-LAD, patent SVG-OM, and patent SVG-PDA), DM, HTN, HLD, and SVT (s/p ablation) [facility] for evaluation of chest pain.  Reported walking earlier that morning when she developed chest pain and SOB when she got to the top of a hill. She walks every morning and had noticed over the past week she had been unusually fatigued on her morning walks. Described her chest pain as a substernal squeezing with associated jaw pain. Her breathing became very labored and she walked back home and called her sister who advised her to call 911. Her chest pain subsided when she was able to sit down and rest. Denies radiation of pain, nausea, vomiting or diaphoresis.   Upon arrival to the ED, her EKG showed NSR with 1st degree AVB, early R wave progression. Initial troponin was negative. With her concerning symptoms, she was admitted for further observation and a Lexiscan Myoview was recommended the following morning.   Hospital Course     Consultants: None   The following morning, she denied any repeat episodes of chest pain. Repeat troponin value was negative. Lipid Panel showed total cholesterol 137, HDL 52, and LDL 61. A nuclear stress test was performed which showed no ST elevation with imaging showing no evidence of ischemia and EF estimated at 78%. Overall, the study was low-risk.  She was last examined by Dr. Rennis GoldenHilty and deemed stable for  discharge. Her presenting symptoms were thought to represent possible MSK or GI etiology with her normal cardiac workup this admission. She has scheduled follow-up with Dr. Tenny Crawoss within the next two months and was instructed to notify our office if she needs to be seen in the interim. She was discharged home in good condition.   _____________  Discharge Vitals Blood pressure (!) 109/49, pulse 75, temperature 97.7 F (36.5 C), temperature source Oral, resp. rate 18, height 5\' 4"  (1.626 m), weight 141 lb 4.8 oz (64.1 kg), SpO2 98 %.  Filed Weights   12/06/15 0952 12/06/15 1816 12/07/15 0500  Weight: 142 lb (64.4 kg) 142 lb 3.2 oz (64.5 kg) 141 lb 4.8 oz (64.1 kg)    Labs & Radiologic Studies     CBC  Recent Labs  12/06/15 1036 12/07/15 0440  WBC 7.3 7.5  HGB 12.9 12.7  HCT 36.8 36.7  MCV 92.9 93.9  PLT 146* 149*   Basic Metabolic Panel  Recent Labs  12/06/15 1036 12/07/15 0440  NA 132* 134*  K 4.0 3.5  CL 99* 100*  CO2 25 26  GLUCOSE 128* 102*  BUN 10 10  CREATININE 0.83 0.75  CALCIUM 9.4 9.1   Liver Function Tests No results for input(s): AST, ALT, ALKPHOS, BILITOT, PROT, ALBUMIN in the last 72 hours. No results for input(s): LIPASE, AMYLASE in the last 72 hours. Cardiac Enzymes  Recent Labs  12/06/15 1514  TROPONINI <0.03   BNP Invalid input(s): POCBNP  D-Dimer No results for input(s): DDIMER in the last 72 hours. Hemoglobin A1C No results for input(s): HGBA1C in the last 72 hours. Fasting Lipid Panel  Recent Labs  12/07/15 0440  CHOL 137  HDL 52  LDLCALC 61  TRIG 119  CHOLHDL 2.6   Thyroid Function Tests No results for input(s): TSH, T4TOTAL, T3FREE, THYROIDAB in the last 72 hours.  Invalid input(s): FREET3  Dg Chest 2 View  Result Date: 12/06/2015 CLINICAL DATA:  Chest pain EXAM: CHEST  2 VIEW COMPARISON:  Chest radiograph December 01, 2014 and chest CT December 01, 2014 FINDINGS: There is no edema or consolidation. Heart is upper normal in size  with pulmonary vascularity within normal limits. Patient is status post coronary artery bypass grafting. There is atherosclerotic calcification in the aorta. No adenopathy evident. No pneumothorax. No bone lesions. IMPRESSION: Aortic atherosclerosis. No edema or consolidation. Stable cardiac silhouette. Electronically Signed   By: Bretta Bang III M.D.   On: 12/06/2015 10:17   Nm Myocar Multi W/spect W/wall Motion / Ef  Result Date: 12/07/2015  There was no ST segment deviation noted during stress.  T wave inversion was noted during stress in the V1 and V2 leads, beginning at 0 minutes of stress. T wave inversion persisted.  The study is normal. There is no ischemia . Normal LV function  This is a low risk study.  Nuclear stress EF: 78%.     Diagnostic Studies/Procedures    Nuclear Stress Test Imaging as above.   Disposition   Pt is being discharged home today in good condition.  Follow-up Plans & Appointments    Follow-up Information    Dietrich Pates, MD Follow up on 02/07/2016.   Specialty:  Cardiology Why:  Cardiology Follow-Up on 02/07/2016 at 8:45 AM. If needing to be seen sooner, please call the office at the number provided.  Contact information: [address] [place] [address] [phone]          Discharge Instructions    Diet - low sodium heart healthy    Complete by:  As directed       Discharge Medications     Medication List    TAKE these medications   acetaminophen 500 MG tablet Commonly known as:  TYLENOL Take 1,000 mg by mouth 2 (two) times daily as needed for mild pain or moderate pain.   amLODipine 10 MG tablet Commonly known as:  NORVASC TAKE 1 TABLET BY MOUTH EVERY DAY   aspirin 81 MG tablet Take 81 mg by mouth daily.   atorvastatin 80 MG tablet Commonly known as:  LIPITOR Take 1 tablet (80 mg total) by mouth every evening.   calcium carbonate 500 MG chewable tablet Commonly known as:  TUMS - dosed in mg  elemental calcium Chew 1 tablet by mouth as needed for indigestion or heartburn.   ergocalciferol 50000 units capsule Commonly known as:  VITAMIN D2 Take 50,000 Units by mouth once a week.   esomeprazole 20 MG capsule Commonly known as:  NEXIUM Take 20 mg by mouth daily before breakfast.   furosemide 20 MG tablet Commonly known as:  LASIX TAKE 1 TABLET BY MOUTH EVERY DAY   KLOR-CON M20 20 MEQ tablet Generic drug:  potassium chloride SA TAKE 1 TABLET BY MOUTH EVERY DAY   levothyroxine 75 MCG tablet Commonly known as:  SYNTHROID, LEVOTHROID Take 75 mcg by mouth daily.   lisinopril 5 MG tablet Commonly known as:  PRINIVIL,ZESTRIL TAKE 1 TABLET BY  MOUTH EVERY DAY What changed:  See the new instructions.   metoprolol tartrate 25 MG tablet Commonly known as:  LOPRESSOR Take 1 tablet (25 mg total) by mouth 2 (two) times daily.   nitroGLYCERIN 0.4 MG SL tablet Commonly known as:  NITROSTAT Place 1 tablet (0.4 mg total) under the tongue every 5 (five) minutes x 3 doses as needed for chest pain.          Allergies Allergies  Allergen Reactions  . Actos [Pioglitazone Hydrochloride] Swelling    Edema.  . Codeine     Visual hallucinations.      Outstanding Labs/Studies   None  Duration of Discharge Encounter   Greater than 30 minutes including physician time.  Signed, Ellsworth Lennox, PA-C 12/07/2015, 5:16 PM

## 2015-12-07 NOTE — Discharge Instructions (Signed)
Chest Wall Pain °Chest wall pain is pain in or around the bones and muscles of your chest. Sometimes, an injury causes this pain. Sometimes, the cause may not be known. This pain may take several weeks or longer to get better. °HOME CARE INSTRUCTIONS  °Pay attention to any changes in your symptoms. Take these actions to help with your pain:  °· Rest as told by your health care provider.   °· Avoid activities that cause pain. These include any activities that use your chest muscles or your abdominal and side muscles to lift heavy items.    °· If directed, apply ice to the painful area: °¨ Put ice in a plastic bag. °¨ Place a towel between your skin and the bag. °¨ Leave the ice on for 20 minutes, 2-3 times per day. °· Take over-the-counter and prescription medicines only as told by your health care provider. °· Do not use tobacco products, including cigarettes, chewing tobacco, and e-cigarettes. If you need help quitting, ask your health care provider. °· Keep all follow-up visits as told by your health care provider. This is important. °SEEK MEDICAL CARE IF: °· You have a fever. °· Your chest pain becomes worse. °· You have new symptoms. °SEEK IMMEDIATE MEDICAL CARE IF: °· You have nausea or vomiting. °· You feel sweaty or light-headed. °· You have a cough with phlegm (sputum) or you cough up blood. °· You develop shortness of breath. °  °This information is not intended to replace advice given to you by your health care provider. Make sure you discuss any questions you have with your health care provider. °  °Document Released: 02/13/2005 Document Revised: 11/04/2014 Document Reviewed: 05/11/2014 °Elsevier Interactive Patient Education ©2016 Elsevier Inc. ° °Chest Pain Observation °It is often hard to give a specific diagnosis for the cause of chest pain. Among other possibilities your symptoms might be caused by inadequate oxygen delivery to your heart (angina). Angina that is not treated or evaluated can lead to  a heart attack (myocardial infarction) or death. °Blood tests, electrocardiograms, and X-rays may have been done to help determine a possible cause of your chest pain. After evaluation and observation, your health care provider has determined that it is unlikely your pain was caused by an unstable condition that requires hospitalization. However, a full evaluation of your pain may need to be completed, with additional diagnostic testing as directed. It is very important to keep your follow-up appointments. Not keeping your follow-up appointments could result in permanent heart damage, disability, or death. If there is any problem keeping your follow-up appointments, you must call your health care provider. °HOME CARE INSTRUCTIONS  °Due to the slight chance that your pain could be angina, it is important to follow your health care provider's treatment plan and also maintain a healthy lifestyle: °· Maintain or work toward achieving a healthy weight. °· Stay physically active and exercise regularly. °· Decrease your salt intake. °· Eat a balanced, healthy diet. Talk to a dietitian to learn about heart-healthy foods. °· Increase your fiber intake by including whole grains, vegetables, fruits, and nuts in your diet. °· Avoid situations that cause stress, anger, or depression. °· Take medicines as advised by your health care provider. Report any side effects to your health care provider. Do not stop medicines or adjust the dosages on your own. °· Quit smoking. Do not use nicotine patches or gum until you check with your health care provider. °· Keep your blood pressure, blood sugar, and cholesterol levels within normal   limits. °· Limit alcohol intake to no more than 1 drink per day for women who are not pregnant and 2 drinks per day for men. °· Do not abuse drugs. °SEEK IMMEDIATE MEDICAL CARE IF: °You have severe chest pain or pressure which may include symptoms such as: °· You feel pain or pressure in your arms, neck,  jaw, or back. °· You have severe back or abdominal pain, feel sick to your stomach (nauseous), or throw up (vomit). °· You are sweating profusely. °· You are having a fast or irregular heartbeat. °· You feel short of breath while at rest. °· You notice increasing shortness of breath during rest, sleep, or with activity. °· You have chest pain that does not get better after rest or after taking your usual medicine. °· You wake from sleep with chest pain. °· You are unable to sleep because you cannot breathe. °· You develop a frequent cough or you are coughing up blood. °· You feel dizzy, faint, or experience extreme fatigue. °· You develop severe weakness, dizziness, fainting, or chills. °Any of these symptoms may represent a serious problem that is an emergency. Do not wait to see if the symptoms will go away. Call your local emergency services (911 in the [place] Do not drive yourself to the hospital. °MAKE SURE YOU: °· Understand these instructions. °· Will watch your condition. °· Will get help right away if you are not doing well or get worse. °  °This information is not intended to replace advice given to you by your health care provider. Make sure you discuss any questions you have with your health care provider. °  °Document Released: 03/18/2010 Document Revised: 02/18/2013 Document Reviewed: 08/15/2012 °Elsevier Interactive Patient Education ©2016 Elsevier Inc. ° °

## 2015-12-07 NOTE — Progress Notes (Signed)
Hospital Problem List     Active Problems:   Pure hypercholesterolemia   Hyperlipidemia, mixed   HYPERTENSION, BENIGN   S/P CABG x 3   Chest pain with moderate risk for cardiac etiology     Patient Profile:   Primary Cardiologist: Dr. Tenny Craw  82 yo female w/ PMH of CAD (s/p CABG x 3 in 2000), DM, HTN, HLD, and SVT (s/p ablation) who presented to [facility] ED on 12/06/2015 with chest pain while on her morning walk.   Subjective   Denies any recurrent chest discomfort overnight. No dyspnea. Seen in nuclear medicine for 1-day NST.   Inpatient Medications    . amLODipine  10 mg Oral Daily  . aspirin  324 mg Oral NOW   Or  . aspirin  300 mg Rectal NOW  . aspirin EC  81 mg Oral Daily  . atorvastatin  80 mg Oral QPM  . furosemide  20 mg Oral Daily  . heparin  5,000 Units Subcutaneous Q8H  . insulin aspart  0-9 Units Subcutaneous TID WC  . levothyroxine  75 mcg Oral QAC breakfast  . lisinopril  5 mg Oral Daily  . metoprolol tartrate  25 mg Oral BID  . potassium chloride SA  20 mEq Oral Daily  . regadenoson        Vital Signs    Vitals:   12/07/15 0904 12/07/15 0906 12/07/15 0908 12/07/15 0910  BP: (!) 139/58 128/70 (!) 160/64 (!) 147/56  Pulse: 68 (!) 104 98 93  Resp:      Temp:      TempSrc:      SpO2:      Weight:      Height:        Intake/Output Summary (Last 24 hours) at 12/07/15 0912 Last data filed at 12/06/15 2306  Gross per 24 hour  Intake                0 ml  Output              950 ml  Net             -950 ml   Filed Weights   12/06/15 0952 12/06/15 1816 12/07/15 0500  Weight: 142 lb (64.4 kg) 142 lb 3.2 oz (64.5 kg) 141 lb 4.8 oz (64.1 kg)    Physical Exam    General: Well developed, well nourished, female appearing in no acute distress. Head: Normocephalic, atraumatic.  Neck: Supple without bruits, JVD not elevated. Lungs:  Resp regular and unlabored, CTA without wheezing or rales. Heart: RRR, S1, S2, no S3, S4, or murmur; no rub. Abdomen:  Soft, non-tender, non-distended with normoactive bowel sounds. No hepatomegaly. No rebound/guarding. No obvious abdominal masses. Extremities: No clubbing, cyanosis, or edema. Distal pedal pulses are 2+ bilaterally. Neuro: Alert and oriented X 3. Moves all extremities spontaneously. Psych: Normal affect.  Labs    CBC  Recent Labs  12/06/15 1036 12/07/15 0440  WBC 7.3 7.5  HGB 12.9 12.7  HCT 36.8 36.7  MCV 92.9 93.9  PLT 146* 149*   Basic Metabolic Panel  Recent Labs  12/06/15 1036 12/07/15 0440  NA 132* 134*  K 4.0 3.5  CL 99* 100*  CO2 25 26  GLUCOSE 128* 102*  BUN 10 10  CREATININE 0.83 0.75  CALCIUM 9.4 9.1   Liver Function Tests No results for input(s): AST, ALT, ALKPHOS, BILITOT, PROT, ALBUMIN in the last 72 hours. No results for input(s): LIPASE, AMYLASE  in the last 72 hours. Cardiac Enzymes  Recent Labs  12/06/15 1514  TROPONINI <0.03   BNP Invalid input(s): POCBNP D-Dimer No results for input(s): DDIMER in the last 72 hours. Hemoglobin A1C No results for input(s): HGBA1C in the last 72 hours. Fasting Lipid Panel  Recent Labs  12/07/15 0440  CHOL 137  HDL 52  LDLCALC 61  TRIG 119  CHOLHDL 2.6   Thyroid Function Tests No results for input(s): TSH, T4TOTAL, T3FREE, THYROIDAB in the last 72 hours.  Invalid input(s): FREET3  Telemetry   Not reviewed. Seen in nuclear medicine.  ECG    NSR, HR 67 with 1st degree AV block.   Cardiac Studies and Radiology    Dg Chest 2 View  Result Date: 12/06/2015 CLINICAL DATA:  Chest pain EXAM: CHEST  2 VIEW COMPARISON:  Chest radiograph December 01, 2014 and chest CT December 01, 2014 FINDINGS: There is no edema or consolidation. Heart is upper normal in size with pulmonary vascularity within normal limits. Patient is status post coronary artery bypass grafting. There is atherosclerotic calcification in the aorta. No adenopathy evident. No pneumothorax. No bone lesions. IMPRESSION: Aortic atherosclerosis.  No edema or consolidation. Stable cardiac silhouette. Electronically Signed   By: Bretta BangWilliam  Woodruff III M.D.   On: 12/06/2015 10:17    Assessment & Plan    1. Chest pain with a history of CAD s/p CABG:  - presents with chest pain which occurred with exercise, resolved with rest, but does describe some resting angina. Initial two troponin values are negative.  - cardiac cath in 2006 showed patent LIMA-LAD, patent SVG-OM, and patent SVG-PDA. Underwent a nonischemic nuclear perfusion study in 11/2011.  - seen in nuclear medicine for 1-day NST. Official read pending by [facility] following stress images.  - continue ASA, statin, BB, ACE-I, and Amlodipine.   2. HTN - Well controlled on current medication regimen. Slightly elevated this AM but patient reports being anxious about her NST.  3. HLD - Lipid Panel shows total cholesterol 137, HDL 52, and LDL 61. - Continue high intensity statin.   4. Hypothyroidism - Continue synthroid.   Signed, Ellsworth LennoxBrittany M Juley Giovanetti , PA-C 9:12 AM 12/07/2015 Pager: [phone]

## 2015-12-07 NOTE — Care Management Obs Status (Signed)
MEDICARE OBSERVATION STATUS NOTIFICATION   Patient Details  Name: [name] MRN: [id number] Date of Birth: Jan 10, 1933   Medicare Observation Status Notification Given:  Yes    Gala LewandowskyGraves-Bigelow, Michaelah Credeur Kaye, RN 12/07/2015, 2:18 PM

## 2015-12-08 LAB — HEMOGLOBIN A1C
Hgb A1c MFr Bld: 5.6 % (ref 4.8–5.6)
Mean Plasma Glucose: 114 mg/dL

## 2015-12-15 ENCOUNTER — Encounter (HOSPITAL_COMMUNITY): Payer: Self-pay

## 2015-12-15 ENCOUNTER — Ambulatory Visit (HOSPITAL_COMMUNITY)
Admission: RE | Admit: 2015-12-15 | Discharge: 2015-12-15 | Disposition: A | Discharge status: Home / Self Care | Payer: Medicare Other | Source: Ambulatory Visit | Attending: Internal Medicine | Admitting: Internal Medicine

## 2015-12-15 DIAGNOSIS — M810 Age-related osteoporosis without current pathological fracture: Secondary | ICD-10-CM | POA: Diagnosis present

## 2015-12-15 MED ORDER — DENOSUMAB 60 MG/ML ~~LOC~~ SOLN
60.0000 mg | Freq: Once | SUBCUTANEOUS | Status: AC
  Administered 2015-12-15: 60 mg via SUBCUTANEOUS
  Filled 2015-12-15: qty 1

## 2015-12-15 NOTE — Discharge Instructions (Signed)
Denosumab injection  What is this medicine?  DENOSUMAB (den oh sue mab) slows bone breakdown. Prolia is used to treat osteoporosis in women after menopause and in men. Xgeva is used to prevent bone fractures and other bone problems caused by cancer bone metastases. Xgeva is also used to treat giant cell tumor of the bone.  This medicine may be used for other purposes; ask your health care provider or pharmacist if you have questions.  What should I tell my health care provider before I take this medicine?  They need to know if you have any of these conditions:  -dental disease  -eczema  -infection or history of infections  -kidney disease or on dialysis  -low blood calcium or vitamin D  -malabsorption syndrome  -scheduled to have surgery or tooth extraction  -taking medicine that contains denosumab  -thyroid or parathyroid disease  -an unusual reaction to denosumab, other medicines, foods, dyes, or preservatives  -pregnant or trying to get pregnant  -breast-feeding  How should I use this medicine?  This medicine is for injection under the skin. It is given by a health care professional in a hospital or clinic setting.  If you are getting Prolia, a special MedGuide will be given to you by the pharmacist with each prescription and refill. Be sure to read this information carefully each time.  For Prolia, talk to your pediatrician regarding the use of this medicine in children. Special care may be needed. For Xgeva, talk to your pediatrician regarding the use of this medicine in children. While this drug may be prescribed for children as young as 13 years for selected conditions, precautions do apply.  Overdosage: If you think you have taken too much of this medicine contact a poison control center or emergency room at once.  NOTE: This medicine is only for you. Do not share this medicine with others.  What if I miss a dose?  It is important not to miss your dose. Call your doctor or health care professional if you are  unable to keep an appointment.  What may interact with this medicine?  Do not take this medicine with any of the following medications:  -other medicines containing denosumab  This medicine may also interact with the following medications:  -medicines that suppress the immune system  -medicines that treat cancer  -steroid medicines like prednisone or cortisone  This list may not describe all possible interactions. Give your health care provider a list of all the medicines, herbs, non-prescription drugs, or dietary supplements you use. Also tell them if you smoke, drink alcohol, or use illegal drugs. Some items may interact with your medicine.  What should I watch for while using this medicine?  Visit your doctor or health care professional for regular checks on your progress. Your doctor or health care professional may order blood tests and other tests to see how you are doing.  Call your doctor or health care professional if you get a cold or other infection while receiving this medicine. Do not treat yourself. This medicine may decrease your body's ability to fight infection.  You should make sure you get enough calcium and vitamin D while you are taking this medicine, unless your doctor tells you not to. Discuss the foods you eat and the vitamins you take with your health care professional.  See your dentist regularly. Brush and floss your teeth as directed. Before you have any dental work done, tell your dentist you are receiving this medicine.  Do   not become pregnant while taking this medicine or for 5 months after stopping it. Women should inform their doctor if they wish to become pregnant or think they might be pregnant. There is a potential for serious side effects to an unborn child. Talk to your health care professional or pharmacist for more information.  What side effects may I notice from receiving this medicine?  Side effects that you should report to your doctor or health care professional as soon as  possible:  -allergic reactions like skin rash, itching or hives, swelling of the face, lips, or tongue  -breathing problems  -chest pain  -fast, irregular heartbeat  -feeling faint or lightheaded, falls  -fever, chills, or any other sign of infection  -muscle spasms, tightening, or twitches  -numbness or tingling  -skin blisters or bumps, or is dry, peels, or red  -slow healing or unexplained pain in the mouth or jaw  -unusual bleeding or bruising  Side effects that usually do not require medical attention (Report these to your doctor or health care professional if they continue or are bothersome.):  -muscle pain  -stomach upset, gas  This list may not describe all possible side effects. Call your doctor for medical advice about side effects. You may report side effects to FDA at 1-800-FDA-1088.  Where should I keep my medicine?  This medicine is only given in a clinic, doctor's office, or other health care setting and will not be stored at home.  NOTE: This sheet is a summary. It may not cover all possible information. If you have questions about this medicine, talk to your doctor, pharmacist, or health care provider.      2016, Elsevier/Gold Standard. (2011-08-14 12:37:47)

## 2016-01-29 ENCOUNTER — Other Ambulatory Visit: Payer: Self-pay | Admitting: Internal Medicine

## 2016-02-06 NOTE — Progress Notes (Signed)
Cardiology Office Note   Date:  02/07/2016   ID:  [name], DOB Nov 14, 1932, MRN [id number]  PCP:  Garlan FillersPATERSON,DANIEL G, MD  Cardiologist:   Dietrich PatesPaula Ajla Mcgeachy, MD    F/U of CAD     History of Present Illness: [name] is a 82 y.o. female with a history of CAD (s/p CABG in 2000.  Myoview in 2013 was normal  I sa her in clinic in 2016 November   [facility]  Complained of CP and SOB   Myoview scan was normal    Cp has gone away  Breathing is good  Walks everyday 30 min      Outpatient Medications Prior to Visit  Medication Sig Dispense Refill  . acetaminophen (TYLENOL) 500 MG tablet Take 1,000 mg by mouth 2 (two) times daily as needed for mild pain or moderate pain.     Marland Kitchen. amLODipine (NORVASC) 10 MG tablet TAKE 1 TABLET BY MOUTH EVERY DAY 90 tablet 0  . aspirin 81 MG tablet Take 81 mg by mouth daily.      Marland Kitchen. atorvastatin (LIPITOR) 80 MG tablet Take 1 tablet (80 mg total) by mouth every evening. 30 tablet 11  . calcium carbonate (TUMS - DOSED IN MG ELEMENTAL CALCIUM) 500 MG chewable tablet Chew 1 tablet by mouth as needed for indigestion or heartburn.     . ergocalciferol (VITAMIN D2) 50000 UNITS capsule Take 50,000 Units by mouth once a week.      . esomeprazole (NEXIUM) 20 MG capsule Take 20 mg by mouth daily before breakfast.    . furosemide (LASIX) 20 MG tablet TAKE 1 TABLET BY MOUTH EVERY DAY 90 tablet 0  . KLOR-CON M20 20 MEQ tablet TAKE 1 TABLET BY MOUTH EVERY DAY 90 tablet 3  . levothyroxine (SYNTHROID, LEVOTHROID) 75 MCG tablet Take 75 mcg by mouth daily.      Marland Kitchen. lisinopril (PRINIVIL,ZESTRIL) 5 MG tablet TAKE 1 TABLET BY MOUTH EVERY DAY (Patient taking differently: Take 1 tablet by mouth every day.) 30 tablet 6  . metoprolol (LOPRESSOR) 25 MG tablet Take 1 tablet (25 mg total) by mouth 2 (two) times daily. 180 tablet 3  . nitroGLYCERIN (NITROSTAT) 0.4 MG SL tablet Place 1 tablet (0.4 mg total) under the tongue every 5 (five) minutes x 3 doses  as needed for chest pain. 25 tablet 2   No facility-administered medications prior to visit.      Allergies:   Actos [pioglitazone hydrochloride] and Codeine   Past Medical History:  Diagnosis Date  . Coronary artery disease   . Diabetes mellitus   . DJD (degenerative joint disease)   . Edema   . GERD (gastroesophageal reflux disease)   . Hiatal hernia   . Hyperlipidemia   . Hypertension   . Hypothyroidism   . IBS (irritable bowel syndrome)   . SVT (supraventricular tachycardia) (HCC)    with ablation    Past Surgical History:  Procedure Laterality Date  . APPENDECTOMY    . CHOLECYSTECTOMY  2005  . CORONARY ARTERY BYPASS GRAFT  2000  . REPLACEMENT TOTAL KNEE  2009   left     Social History:  The patient  reports that she has never smoked. She has never used smokeless tobacco. She reports that she does not drink alcohol or use drugs.   Family History:  The patient's family history includes Breast cancer in her sister; Coronary artery disease in her brother; Diabetes in her  sister; Heart attack (age of onset: 5365) in her father; Heart attack (age of onset: 4683) in her mother.    ROS:  Please see the history of present illness. All other systems are reviewed and  Negative to the above problem except as noted.    PHYSICAL EXAM: VS:  BP 138/68   Pulse 78   Ht 5\' 4"  (1.626 m)   Wt 151 lb (68.5 kg)   SpO2 98%   BMI 25.92 kg/m   GEN: Well nourished, well developed, in no acute distress  HEENT: normal  Neck: no JVD, carotid bruits, or masses Cardiac: RRR; no murmurs, rubs, or gallops,no edema  Respiratory:  clear to auscultation bilaterally, normal work of breathing GI: soft, nontender, nondistended, + BS  No hepatomegaly  MS: no deformity Moving all extremities   Skin: warm and dry, no rash Neuro:  Strength and sensation are intact Psych: euthymic mood, full affect   EKG:  EKG is not ordered today.   Lipid Panel    Component Value Date/Time   CHOL 137  12/07/2015 0440   TRIG 119 12/07/2015 0440   HDL 52 12/07/2015 0440   CHOLHDL 2.6 12/07/2015 0440   VLDL 24 12/07/2015 0440   LDLCALC 61 12/07/2015 0440      Wt Readings from Last 3 Encounters:  02/07/16 151 lb (68.5 kg)  12/15/15 146 lb 9.6 oz (66.5 kg)  12/07/15 141 lb 4.8 oz (64.1 kg)      ASSESSMENT AND PLAN:  1  CAD  No symptoms to sugg angina  Myovue normal  2  HL  Lipid in October  LDL was 61  3  HTN  BP is good    F/U in 1 year     Current medicines are reviewed at length with the patient today.  The patient does not have concerns regarding medicines.  Signed, Dietrich PatesPaula Gilma Bessette, MD  02/07/2016 8:49 AM    [facility] [address], [place], [place]  [address] Phone: [phone]; Fax: [phone]

## 2016-02-07 ENCOUNTER — Encounter (INDEPENDENT_AMBULATORY_CARE_PROVIDER_SITE_OTHER): Payer: Self-pay

## 2016-02-07 ENCOUNTER — Other Ambulatory Visit: Payer: Self-pay | Admitting: Internal Medicine

## 2016-02-07 ENCOUNTER — Encounter: Payer: Self-pay | Admitting: Internal Medicine

## 2016-02-07 ENCOUNTER — Ambulatory Visit (INDEPENDENT_AMBULATORY_CARE_PROVIDER_SITE_OTHER): Payer: Medicare Other | Admitting: Internal Medicine

## 2016-02-07 VITALS — BP 138/68 | HR 78 | Ht 64.0 in | Wt 151.0 lb

## 2016-02-07 DIAGNOSIS — I25810 Atherosclerosis of coronary artery bypass graft(s) without angina pectoris: Secondary | ICD-10-CM | POA: Diagnosis not present

## 2016-02-07 DIAGNOSIS — E785 Hyperlipidemia, unspecified: Secondary | ICD-10-CM

## 2016-02-07 DIAGNOSIS — I10 Essential (primary) hypertension: Secondary | ICD-10-CM | POA: Diagnosis not present

## 2016-02-07 NOTE — Patient Instructions (Signed)
Your physician recommends that you continue on your current medications as directed. Please refer to the Current Medication list given to you today. Your physician wants you to follow-up in: 1 year with Dr. Ross.  You will receive a reminder letter in the mail two months in advance. If you don't receive a letter, please call our office to schedule the follow-up appointment.  

## 2016-03-06 ENCOUNTER — Other Ambulatory Visit: Payer: Self-pay | Admitting: Internal Medicine

## 2016-04-27 ENCOUNTER — Other Ambulatory Visit: Payer: Self-pay | Admitting: Internal Medicine

## 2016-06-16 ENCOUNTER — Ambulatory Visit (HOSPITAL_COMMUNITY)
Admission: RE | Admit: 2016-06-16 | Discharge: 2016-06-16 | Disposition: A | Discharge status: Home / Self Care | Payer: Medicare Other | Source: Ambulatory Visit | Attending: Internal Medicine | Admitting: Internal Medicine

## 2016-06-16 ENCOUNTER — Encounter (HOSPITAL_COMMUNITY): Payer: Self-pay

## 2016-06-16 DIAGNOSIS — M810 Age-related osteoporosis without current pathological fracture: Secondary | ICD-10-CM | POA: Insufficient documentation

## 2016-06-16 HISTORY — DX: Cardiac arrhythmia, unspecified: I49.9

## 2016-06-16 HISTORY — DX: Personal history of other medical treatment: Z92.89

## 2016-06-16 HISTORY — DX: Anemia, unspecified: D64.9

## 2016-06-16 MED ORDER — DENOSUMAB 60 MG/ML ~~LOC~~ SOLN
60.0000 mg | Freq: Once | SUBCUTANEOUS | Status: AC
  Administered 2016-06-16: 60 mg via SUBCUTANEOUS
  Filled 2016-06-16: qty 1

## 2016-06-16 NOTE — Discharge Instructions (Signed)
Denosumab injection/Prolia Injection  °What is this medicine? °DENOSUMAB (den oh sue mab) slows bone breakdown. Prolia is used to treat osteoporosis in women after menopause and in men. Xgeva is used to treat a high calcium level due to cancer and to prevent bone fractures and other bone problems caused by multiple myeloma or cancer bone metastases. Xgeva is also used to treat giant cell tumor of the bone. °This medicine may be used for other purposes; ask your health care provider or pharmacist if you have questions. °COMMON BRAND NAME(S): Prolia, XGEVA °What should I tell my health care provider before I take this medicine? °They need to know if you have any of these conditions: °-dental disease °-having surgery or tooth extraction °-infection °-kidney disease °-low levels of calcium or Vitamin D in the blood °-malnutrition °-on hemodialysis °-skin conditions or sensitivity °-thyroid or parathyroid disease °-an unusual reaction to denosumab, other medicines, foods, dyes, or preservatives °-pregnant or trying to get pregnant °-breast-feeding °How should I use this medicine? °This medicine is for injection under the skin. It is given by a health care professional in a hospital or clinic setting. °If you are getting Prolia, a special MedGuide will be given to you by the pharmacist with each prescription and refill. Be sure to read this information carefully each time. °For Prolia, talk to your pediatrician regarding the use of this medicine in children. Special care may be needed. For Xgeva, talk to your pediatrician regarding the use of this medicine in children. While this drug may be prescribed for children as young as 13 years for selected conditions, precautions do apply. °Overdosage: If you think you have taken too much of this medicine contact a poison control center or emergency room at once. °NOTE: This medicine is only for you. Do not share this medicine with others. °What if I miss a dose? °It is important  not to miss your dose. Call your doctor or health care professional if you are unable to keep an appointment. °What may interact with this medicine? °Do not take this medicine with any of the following medications: °-other medicines containing denosumab °This medicine may also interact with the following medications: °-medicines that lower your chance of fighting infection °-steroid medicines like prednisone or cortisone °This list may not describe all possible interactions. Give your health care provider a list of all the medicines, herbs, non-prescription drugs, or dietary supplements you use. Also tell them if you smoke, drink alcohol, or use illegal drugs. Some items may interact with your medicine. °What should I watch for while using this medicine? °Visit your doctor or health care professional for regular checks on your progress. Your doctor or health care professional may order blood tests and other tests to see how you are doing. °Call your doctor or health care professional for advice if you get a fever, chills or sore throat, or other symptoms of a cold or flu. Do not treat yourself. This drug may decrease your body's ability to fight infection. Try to avoid being around people who are sick. °You should make sure you get enough calcium and vitamin D while you are taking this medicine, unless your doctor tells you not to. Discuss the foods you eat and the vitamins you take with your health care professional. °See your dentist regularly. Brush and floss your teeth as directed. Before you have any dental work done, tell your dentist you are receiving this medicine. °Do not become pregnant while taking this medicine or for 5 months   after stopping it. Talk with your doctor or health care professional about your birth control options while taking this medicine. Women should inform their doctor if they wish to become pregnant or think they might be pregnant. There is a potential for serious side effects to an  unborn child. Talk to your health care professional or pharmacist for more information. °What side effects may I notice from receiving this medicine? °Side effects that you should report to your doctor or health care professional as soon as possible: °-allergic reactions like skin rash, itching or hives, swelling of the face, lips, or tongue °-bone pain °-breathing problems °-dizziness °-jaw pain, especially after dental work °-redness, blistering, peeling of the skin °-signs and symptoms of infection like fever or chills; cough; sore throat; pain or trouble passing urine °-signs of low calcium like fast heartbeat, muscle cramps or muscle pain; pain, tingling, numbness in the hands or feet; seizures °-unusual bleeding or bruising °-unusually weak or tired °Side effects that usually do not require medical attention (report to your doctor or health care professional if they continue or are bothersome): °-constipation °-diarrhea °-headache °-joint pain °-loss of appetite °-muscle pain °-runny nose °-tiredness °-upset stomach °This list may not describe all possible side effects. Call your doctor for medical advice about side effects. You may report side effects to FDA at 1-800-FDA-1088. °Where should I keep my medicine? °This medicine is only given in a clinic, doctor's office, or other health care setting and will not be stored at home. °NOTE: This sheet is a summary. It may not cover all possible information. If you have questions about this medicine, talk to your doctor, pharmacist, or health care provider. °© 2018 Elsevier/Gold Standard (2016-03-07 19:17:21) ° °

## 2016-07-10 ENCOUNTER — Other Ambulatory Visit: Payer: Self-pay | Admitting: Internal Medicine

## 2016-09-11 ENCOUNTER — Other Ambulatory Visit: Payer: Self-pay | Admitting: Internal Medicine

## 2016-11-20 ENCOUNTER — Other Ambulatory Visit: Payer: Self-pay | Admitting: Internal Medicine

## 2016-12-18 ENCOUNTER — Ambulatory Visit (HOSPITAL_COMMUNITY)
Admission: RE | Admit: 2016-12-18 | Discharge: 2016-12-18 | Disposition: A | Discharge status: Home / Self Care | Payer: Medicare Other | Source: Ambulatory Visit | Attending: Internal Medicine | Admitting: Internal Medicine

## 2016-12-18 DIAGNOSIS — M810 Age-related osteoporosis without current pathological fracture: Secondary | ICD-10-CM | POA: Insufficient documentation

## 2016-12-18 MED ORDER — DENOSUMAB 60 MG/ML ~~LOC~~ SOLN
60.0000 mg | Freq: Once | SUBCUTANEOUS | Status: AC
  Administered 2016-12-18: 60 mg via SUBCUTANEOUS
  Filled 2016-12-18: qty 1

## 2016-12-18 NOTE — Progress Notes (Signed)
prolia 60mg  sq given to rt upper arm.  D/c instructions given on prolia.  Pt d/c ambulatory with cane to lobby.  Pt informed to call Dr. Silvano RuskPaterson's office for next appointment, due after June 19, 2017.

## 2016-12-18 NOTE — Discharge Instructions (Signed)
Please call office in 6 months for next appointment date/time.  Injection is due anytime after June 19, 2017.  Denosumab injection What is this medicine? DENOSUMAB (den oh sue mab) slows bone breakdown. Prolia is used to treat osteoporosis in women after menopause and in men. Delton See is used to treat a high calcium level due to cancer and to prevent bone fractures and other bone problems caused by multiple myeloma or cancer bone metastases. Delton See is also used to treat giant cell tumor of the bone. This medicine may be used for other purposes; ask your health care provider or pharmacist if you have questions. COMMON BRAND NAME(S): Prolia, XGEVA What should I tell my health care provider before I take this medicine? They need to know if you have any of these conditions: -dental disease -having surgery or tooth extraction -infection -kidney disease -low levels of calcium or Vitamin D in the blood -malnutrition -on hemodialysis -skin conditions or sensitivity -thyroid or parathyroid disease -an unusual reaction to denosumab, other medicines, foods, dyes, or preservatives -pregnant or trying to get pregnant -breast-feeding How should I use this medicine? This medicine is for injection under the skin. It is given by a health care professional in a hospital or clinic setting. If you are getting Prolia, a special MedGuide will be given to you by the pharmacist with each prescription and refill. Be sure to read this information carefully each time. For Prolia, talk to your pediatrician regarding the use of this medicine in children. Special care may be needed. For Delton See, talk to your pediatrician regarding the use of this medicine in children. While this drug may be prescribed for children as young as 13 years for selected conditions, precautions do apply. Overdosage: If you think you have taken too much of this medicine contact a poison control center or emergency room at once. NOTE: This medicine  is only for you. Do not share this medicine with others. What if I miss a dose? It is important not to miss your dose. Call your doctor or health care professional if you are unable to keep an appointment. What may interact with this medicine? Do not take this medicine with any of the following medications: -other medicines containing denosumab This medicine may also interact with the following medications: -medicines that lower your chance of fighting infection -steroid medicines like prednisone or cortisone This list may not describe all possible interactions. Give your health care provider a list of all the medicines, herbs, non-prescription drugs, or dietary supplements you use. Also tell them if you smoke, drink alcohol, or use illegal drugs. Some items may interact with your medicine. What should I watch for while using this medicine? Visit your doctor or health care professional for regular checks on your progress. Your doctor or health care professional may order blood tests and other tests to see how you are doing. Call your doctor or health care professional for advice if you get a fever, chills or sore throat, or other symptoms of a cold or flu. Do not treat yourself. This drug may decrease your body's ability to fight infection. Try to avoid being around people who are sick. You should make sure you get enough calcium and vitamin D while you are taking this medicine, unless your doctor tells you not to. Discuss the foods you eat and the vitamins you take with your health care professional. See your dentist regularly. Brush and floss your teeth as directed. Before you have any dental work done, tell your  dentist you are receiving this medicine. Do not become pregnant while taking this medicine or for 5 months after stopping it. Talk with your doctor or health care professional about your birth control options while taking this medicine. Women should inform their doctor if they wish to  become pregnant or think they might be pregnant. There is a potential for serious side effects to an unborn child. Talk to your health care professional or pharmacist for more information. What side effects may I notice from receiving this medicine? Side effects that you should report to your doctor or health care professional as soon as possible: -allergic reactions like skin rash, itching or hives, swelling of the face, lips, or tongue -bone pain -breathing problems -dizziness -jaw pain, especially after dental work -redness, blistering, peeling of the skin -signs and symptoms of infection like fever or chills; cough; sore throat; pain or trouble passing urine -signs of low calcium like fast heartbeat, muscle cramps or muscle pain; pain, tingling, numbness in the hands or feet; seizures -unusual bleeding or bruising -unusually weak or tired Side effects that usually do not require medical attention (report to your doctor or health care professional if they continue or are bothersome): -constipation -diarrhea -headache -joint pain -loss of appetite -muscle pain -runny nose -tiredness -upset stomach This list may not describe all possible side effects. Call your doctor for medical advice about side effects. You may report side effects to FDA at 1-800-FDA-1088. Where should I keep my medicine? This medicine is only given in a clinic, doctor's office, or other health care setting and will not be stored at home. NOTE: This sheet is a summary. It may not cover all possible information. If you have questions about this medicine, talk to your doctor, pharmacist, or health care provider.  2018 Elsevier/Gold Standard (2016-03-07 19:17:21)

## 2017-01-04 ENCOUNTER — Other Ambulatory Visit: Payer: Self-pay | Admitting: Internal Medicine

## 2017-01-20 ENCOUNTER — Other Ambulatory Visit: Payer: Self-pay | Admitting: Internal Medicine

## 2017-02-03 ENCOUNTER — Other Ambulatory Visit: Payer: Self-pay | Admitting: Internal Medicine

## 2017-03-02 ENCOUNTER — Encounter: Payer: Self-pay | Admitting: Internal Medicine

## 2017-03-02 ENCOUNTER — Ambulatory Visit: Payer: Medicare Other | Admitting: Internal Medicine

## 2017-03-02 VITALS — BP 126/62 | HR 76 | Ht 63.0 in | Wt 154.0 lb

## 2017-03-02 DIAGNOSIS — I10 Essential (primary) hypertension: Secondary | ICD-10-CM

## 2017-03-02 DIAGNOSIS — I2510 Atherosclerotic heart disease of native coronary artery without angina pectoris: Secondary | ICD-10-CM

## 2017-03-02 DIAGNOSIS — E782 Mixed hyperlipidemia: Secondary | ICD-10-CM | POA: Diagnosis not present

## 2017-03-02 MED ORDER — ATORVASTATIN CALCIUM 80 MG PO TABS: 80.0000 mg | ORAL_TABLET | Freq: Every evening | ORAL | 3 refills | Status: DC

## 2017-03-02 NOTE — Patient Instructions (Addendum)
Your physician recommends that you continue on your current medications as directed. Please refer to the Current Medication list given to you today. Your physician wants you to follow-up in: 1 year with Dr. Ross.  You will receive a reminder letter in the mail two months in advance. If you don't receive a letter, please call our office to schedule the follow-up appointment.  

## 2017-03-02 NOTE — Progress Notes (Signed)
Cardiology Office Note   Date:  03/02/2017   ID:  [name], DOB May 10, 1932, MRN [id number]  PCP:  Jarome MatinPaterson, Daniel, MD  Cardiologist:   Dietrich PatesPaula Elizabeth Haff, MD    F/U of CAD     History of Present Illness: [name] is a 84 y.o. female with a history of CAD (s/p CABG in 2000.  Myoview in 2013 was normal.   She was [facility] in October 2016 Complained of CP and    Myoview scan was normal    I saw her in Dec [id number]    Leodis RainsBreathng is OK   Rare CP  Worse with lifting  Not with walking  Watks some  Feels good     Outpatient Medications Prior to Visit  Medication Sig Dispense Refill  . acetaminophen (TYLENOL) 500 MG tablet Take 1,000 mg by mouth 2 (two) times daily as needed for mild pain or moderate pain.     Marland Kitchen. amLODipine (NORVASC) 10 MG tablet Take 1 tablet (10 mg total) by mouth daily. Please keep upcoming appt with Dr. Tenny Crawoss for January. Thank you 90 tablet 0  . aspirin 81 MG tablet Take 81 mg by mouth daily.      Marland Kitchen. atorvastatin (LIPITOR) 80 MG tablet Take 1 tablet (80 mg total) every evening by mouth. Please keep upcoming appt in January. Thank you 30 tablet 2  . calcium carbonate (TUMS - DOSED IN MG ELEMENTAL CALCIUM) 500 MG chewable tablet Chew 1 tablet by mouth as needed for indigestion or heartburn.     . ergocalciferol (VITAMIN D2) 50000 UNITS capsule Take 50,000 Units by mouth once a week.      . esomeprazole (NEXIUM) 20 MG capsule Take 20 mg by mouth daily before breakfast.    . furosemide (LASIX) 20 MG tablet TAKE 1 TABLET BY MOUTH EVERY DAY 90 tablet 1  . KLOR-CON M20 20 MEQ tablet TAKE 1 TABLET BY MOUTH EVERY DAY 90 tablet 3  . levothyroxine (SYNTHROID, LEVOTHROID) 75 MCG tablet Take 75 mcg by mouth daily.      Marland Kitchen. lisinopril (PRINIVIL,ZESTRIL) 5 MG tablet TAKE 1 TABLET BY MOUTH EVERY DAY (Patient taking differently: Take 1 tablet by mouth every day.) 30 tablet 6  . metoprolol (LOPRESSOR) 25 MG tablet Take 1 tablet (25 mg total) by mouth 2 (two) times daily. 180  tablet 3  . nitroGLYCERIN (NITROSTAT) 0.4 MG SL tablet Place 1 tablet (0.4 mg total) under the tongue every 5 (five) minutes x 3 doses as needed for chest pain. 25 tablet 2  . metoprolol tartrate (LOPRESSOR) 50 MG tablet TAKE 1 TABLET BY MOUTH TWICE A DAY 180 tablet 0   No facility-administered medications prior to visit.      Allergies:   Actos [pioglitazone hydrochloride] and Codeine   Past Medical History:  Diagnosis Date  . Anemia   . Coronary artery disease   . Diabetes mellitus   . DJD (degenerative joint disease)   . Dysrhythmia   . Edema   . GERD (gastroesophageal reflux disease)   . Hiatal hernia   . History of blood transfusion   . Hyperlipidemia   . Hypertension   . Hypothyroidism   . IBS (irritable bowel syndrome)   . SVT (supraventricular tachycardia) (HCC)    with ablation    Past Surgical History:  Procedure Laterality Date  . APPENDECTOMY    . BREAST SURGERY     biopsy per left breast   . CARDIAC CATHETERIZATION    .  CHOLECYSTECTOMY  2005  . CORONARY ARTERY BYPASS GRAFT  2000  . EYE SURGERY     cataract surgery bilat   . REPLACEMENT TOTAL KNEE  2009   left     Social History:  The patient  reports that  has never smoked. she has never used smokeless tobacco. She reports that she does not drink alcohol or use drugs.   Family History:  The patient's family history includes Breast cancer in her sister; Coronary artery disease in her brother; Diabetes in her sister; Heart attack (age of onset: 78) in her father; Heart attack (age of onset: 24) in her mother; Heart disease in her unknown relative.    ROS:  Please see the history of present illness. All other systems are reviewed and  Negative to the above problem except as noted.    PHYSICAL EXAM: VS:  BP 126/62   Pulse 76   Ht 5\' 3"  (1.6 m)   Wt 154 lb (69.9 kg)   SpO2 96%   BMI 27.28 kg/m   GEN: Well nourished, well developed, in no acute distress  HEENT: normal  Neck: no JVD, carotid bruits,  or masses Cardiac: RRR; no murmurs, rubs, or gallops,no edema  Respiratory:  clear to auscultation bilaterally, normal work of breathing GI: soft, nontender, nondistended, + BS  No hepatomegaly  MS: no deformity Moving all extremities   Skin: warm and dry, no rash Neuro:  Strength and sensation are intact Psych: euthymic mood, full affect   EKG:  EKG is  ordered today.  SR 76  First degree AV block PR 244.  Sl ST depression in V4 to V6     Lipid Panel    Component Value Date/Time   CHOL 137 12/07/2015 0440   TRIG 119 12/07/2015 0440   HDL 52 12/07/2015 0440   CHOLHDL 2.6 12/07/2015 0440   VLDL 24 12/07/2015 0440   LDLCALC 61 12/07/2015 0440      Wt Readings from Last 3 Encounters:  03/02/17 154 lb (69.9 kg)  12/18/16 150 lb 9.6 oz (68.3 kg)  06/16/16 150 lb (68 kg)      ASSESSMENT AND PLAN:  1  CAD  No symptoms to sugg angina.  Recomm she stay active   2  HL  Lipid in June 2018 LDL 63  HDL 62  Keep on same meds    3  HTN  BP is under good control  Stay active    F/U in 1 year     Current medicines are reviewed at length with the patient today.  The patient does not have concerns regarding medicines.  Signed, Dietrich Pates, MD  03/02/2017 10:57 AM    [facility] [address] [place], [place], [place]  [address] Phone: [phone]; Fax: [phone]

## 2017-03-05 ENCOUNTER — Other Ambulatory Visit: Payer: Self-pay | Admitting: Internal Medicine

## 2017-04-19 ENCOUNTER — Other Ambulatory Visit: Payer: Self-pay | Admitting: Internal Medicine

## 2017-05-23 ENCOUNTER — Other Ambulatory Visit: Payer: Self-pay | Admitting: Internal Medicine

## 2017-06-07 ENCOUNTER — Other Ambulatory Visit (HOSPITAL_COMMUNITY): Payer: Self-pay

## 2017-06-08 ENCOUNTER — Ambulatory Visit (HOSPITAL_COMMUNITY)
Admission: RE | Admit: 2017-06-08 | Discharge: 2017-06-08 | Disposition: A | Discharge status: Home / Self Care | Payer: Medicare Other | Source: Ambulatory Visit | Attending: Internal Medicine | Admitting: Internal Medicine

## 2017-06-08 DIAGNOSIS — M810 Age-related osteoporosis without current pathological fracture: Secondary | ICD-10-CM | POA: Insufficient documentation

## 2017-06-08 MED ORDER — DENOSUMAB 60 MG/ML ~~LOC~~ SOLN
60.0000 mg | Freq: Once | SUBCUTANEOUS | Status: AC
  Administered 2017-06-08: 60 mg via SUBCUTANEOUS
  Filled 2017-06-08: qty 1

## 2018-02-02 ENCOUNTER — Other Ambulatory Visit: Payer: Self-pay | Admitting: Internal Medicine

## 2018-02-06 ENCOUNTER — Other Ambulatory Visit: Payer: Self-pay | Admitting: Internal Medicine

## 2018-03-03 ENCOUNTER — Other Ambulatory Visit: Payer: Self-pay | Admitting: Internal Medicine

## 2018-03-18 ENCOUNTER — Other Ambulatory Visit (HOSPITAL_COMMUNITY): Payer: Self-pay | Admitting: *Deleted

## 2018-03-19 ENCOUNTER — Ambulatory Visit (HOSPITAL_COMMUNITY)
Admission: RE | Admit: 2018-03-19 | Discharge: 2018-03-19 | Disposition: A | Discharge status: Home / Self Care | Payer: Medicare Other | Source: Ambulatory Visit | Attending: Internal Medicine | Admitting: Internal Medicine

## 2018-03-19 DIAGNOSIS — M810 Age-related osteoporosis without current pathological fracture: Secondary | ICD-10-CM | POA: Diagnosis not present

## 2018-03-19 MED ORDER — DENOSUMAB 60 MG/ML ~~LOC~~ SOSY
60.0000 mg | PREFILLED_SYRINGE | Freq: Once | SUBCUTANEOUS | Status: AC
  Administered 2018-03-19: 60 mg via SUBCUTANEOUS

## 2018-03-19 MED ORDER — DENOSUMAB 60 MG/ML ~~LOC~~ SOSY
PREFILLED_SYRINGE | SUBCUTANEOUS | Status: AC
  Filled 2018-03-19: qty 1

## 2018-03-29 ENCOUNTER — Ambulatory Visit: Payer: Medicare Other | Admitting: Internal Medicine

## 2018-03-29 ENCOUNTER — Encounter: Payer: Self-pay | Admitting: Internal Medicine

## 2018-03-29 VITALS — BP 140/68 | HR 66 | Ht 63.0 in | Wt 155.4 lb

## 2018-03-29 DIAGNOSIS — R0602 Shortness of breath: Secondary | ICD-10-CM | POA: Diagnosis not present

## 2018-03-29 DIAGNOSIS — R609 Edema, unspecified: Secondary | ICD-10-CM | POA: Diagnosis not present

## 2018-03-29 DIAGNOSIS — I2510 Atherosclerotic heart disease of native coronary artery without angina pectoris: Secondary | ICD-10-CM

## 2018-03-29 LAB — BASIC METABOLIC PANEL
BUN/Creatinine Ratio: 12 (ref 12–28)
BUN: 10 mg/dL (ref 8–27)
CO2: 21 mmol/L (ref 20–29)
Calcium: 9.1 mg/dL (ref 8.7–10.3)
Chloride: 103 mmol/L (ref 96–106)
Creatinine, Ser: 0.85 mg/dL (ref 0.57–1.00)
GFR calc Af Amer: 73 mL/min/{1.73_m2} (ref >59)
GFR calc non Af Amer: 64 mL/min/{1.73_m2} (ref >59)
GLUCOSE: 117 mg/dL — AB (ref 65–99)
Potassium: 4.8 mmol/L (ref 3.5–5.2)
Sodium: 139 mmol/L (ref 134–144)

## 2018-03-29 LAB — CBC
Hematocrit: 39.3 % (ref 34.0–46.6)
Hemoglobin: 14.3 g/dL (ref 11.1–15.9)
MCH: 32.3 pg (ref 26.6–33.0)
MCHC: 36.4 g/dL — ABNORMAL HIGH (ref 31.5–35.7)
MCV: 89 fL (ref 79–97)
Platelets: 211 10*3/uL (ref 150–450)
RBC: 4.43 x10E6/uL (ref 3.77–5.28)
RDW: 13 % (ref 11.7–15.4)
WBC: 8.3 10*3/uL (ref 3.4–10.8)

## 2018-03-29 LAB — LIPID PANEL
Chol/HDL Ratio: 2.1 ratio (ref 0.0–4.4)
Cholesterol, Total: 166 mg/dL (ref 100–199)
HDL: 80 mg/dL (ref >39)
LDL Calculated: 62 mg/dL (ref 0–99)
Triglycerides: 121 mg/dL (ref 0–149)
VLDL Cholesterol Cal: 24 mg/dL (ref 5–40)

## 2018-03-29 LAB — PRO B NATRIURETIC PEPTIDE: NT-PRO BNP: 412 pg/mL (ref 0–738)

## 2018-03-29 LAB — TSH: TSH: 1.98 u[IU]/mL (ref 0.450–4.500)

## 2018-03-29 NOTE — Progress Notes (Signed)
Cardiology Office Note   Date:  03/29/2018   ID:  [name], DOB Apr 06, 1932, MRN [id number]  PCP:  Aimee Battles, MD  Cardiologist:   Dorris Carnes, MD    F/U of CAD     History of Present Illness: [name] is a 85 y.o. female with a history of CAD (s/p CABG in 2000.  Myoview in 2013 was normal.   She was admitted to [facility] in October 2016 Complained of CP and    Myoview scan was normal    I saw her in Jan  2019    the patient presents today for follow-up.  The patient says she was doing well until Christmas.  At that time she started eating more knots.  Around the first of the year she noticed she had increased swelling in her ankles.  She had not noticed her ankles before.  She denies chest pain breathing is okay at rest but she says when she gets up to walk she will get some shortness of breath.  Because of the weather she has not done much walking.  She sleeps chronically with 2 pillows because of acid reflux.  She denies any PND.  No palpitations.  No dizziness.   Outpatient Medications Prior to Visit  Medication Sig Dispense Refill  . acetaminophen (TYLENOL) 500 MG tablet Take 1,000 mg by mouth 2 (two) times daily as needed for mild pain or moderate pain.     Marland Kitchen amLODipine (NORVASC) 10 MG tablet Take 1 tablet (10 mg total) by mouth daily. 90 tablet 3  . aspirin 81 MG tablet Take 81 mg by mouth daily.      Marland Kitchen atorvastatin (LIPITOR) 80 MG tablet Take 1 tablet (80 mg total) by mouth every evening. Please keep upcoming appt in January with Dr. Harrington Challenger for future refills. Thank you 90 tablet 0  . calcium carbonate (TUMS - DOSED IN MG ELEMENTAL CALCIUM) 500 MG chewable tablet Chew 1 tablet by mouth as needed for indigestion or heartburn.     . ergocalciferol (VITAMIN D2) 50000 UNITS capsule Take 50,000 Units by mouth once a week.      . esomeprazole (NEXIUM) 20 MG capsule Take 20 mg by mouth daily before breakfast.    . furosemide (LASIX) 20 MG tablet TAKE 1 TABLET BY  MOUTH EVERY DAY 90 tablet 0  . KLOR-CON M20 20 MEQ tablet TAKE 1 TABLET BY MOUTH EVERY DAY 90 tablet 0  . levothyroxine (SYNTHROID, LEVOTHROID) 75 MCG tablet Take 75 mcg by mouth daily.      Marland Kitchen lisinopril (PRINIVIL,ZESTRIL) 5 MG tablet TAKE 1 TABLET BY MOUTH EVERY DAY (Patient taking differently: Take 1 tablet by mouth every day.) 30 tablet 6  . metoprolol tartrate (LOPRESSOR) 50 MG tablet TAKE 1 TABLET BY MOUTH TWICE A DAY 180 tablet 3  . nitroGLYCERIN (NITROSTAT) 0.4 MG SL tablet Place 1 tablet (0.4 mg total) under the tongue every 5 (five) minutes x 3 doses as needed for chest pain. 25 tablet 2   No facility-administered medications prior to visit.      Allergies:   Actos [pioglitazone hydrochloride] and Codeine   Past Medical History:  Diagnosis Date  . Anemia   . Coronary artery disease   . Diabetes mellitus   . DJD (degenerative joint disease)   . Dysrhythmia   . Edema   . GERD (gastroesophageal reflux disease)   . Hiatal hernia   . History of blood transfusion   . Hyperlipidemia   .  Hypertension   . Hypothyroidism   . IBS (irritable bowel syndrome)   . SVT (supraventricular tachycardia) ([place])    with ablation    Past Surgical History:  Procedure Laterality Date  . APPENDECTOMY    . BREAST SURGERY     biopsy per left breast   . CARDIAC CATHETERIZATION    . CHOLECYSTECTOMY  2005  . CORONARY ARTERY BYPASS GRAFT  2000  . EYE SURGERY     cataract surgery bilat   . REPLACEMENT TOTAL KNEE  2009   left     Social History:  The patient  reports that she has never smoked. She has never used smokeless tobacco. She reports that she does not drink alcohol or use drugs.   Family History:  The patient's family history includes Breast cancer in her sister; Coronary artery disease in her brother; Diabetes in her sister; Heart attack (age of onset: 26) in her father; Heart attack (age of onset: 40) in her mother; Heart disease in an other family member.    ROS:  Please see the  history of present illness. All other systems are reviewed and  Negative to the above problem except as noted.    PHYSICAL EXAM: VS:  BP 140/68   Pulse 66   Ht 5' 3"  (1.6 m)   Wt 155 lb 6.4 oz (70.5 kg)   BMI 27.53 kg/m   GEN: Well nourished, well developed, in no acute distress  HEENT: normal  Neck: JVP is normal NO, carotid bruits, or masses Cardiac: RRR; no murmurs, rubs, or gallops, 1+ nonpitting  edema  Respiratory:  clear to auscultation bilaterally, normal work of breathing GI: soft, nontender, nondistended, + BS  No hepatomegaly  MS: no deformity Moving all extremities   Skin: warm and dry, no rash Neuro:  Strength and sensation are intact Psych: euthymic mood, full affect   EKG:  EKG is  ordered today.  SR 66 bpm   First degree AV block   PR 264 msec   Incompo RBBB.     Lipid Panel    Component Value Date/Time   CHOL 137 12/07/2015 0440   TRIG 119 12/07/2015 0440   HDL 52 12/07/2015 0440   CHOLHDL 2.6 12/07/2015 0440   VLDL 24 12/07/2015 0440   LDLCALC 61 12/07/2015 0440      Wt Readings from Last 3 Encounters:  03/29/18 155 lb 6.4 oz (70.5 kg)  03/02/17 154 lb (69.9 kg)  12/18/16 150 lb 9.6 oz (68.3 kg)      ASSESSMENT AND PLAN:  1  Edema patient has some swelling in her legs.  This is new for her.  May be attributed to the salt intake over the holidays with increased not eating.  Her weight is up by her home scale by about 8 pounds.  Here in the office only 1 pound increase.  What I recommended is that she have labs done today (B met, CBC, TSH, BNP).  I would also set her up for cardiac ultrasound to evaluate LV function.  2  CAD patient has remote surgery.  Myoview in 2013 was normal.  I am not convinced the current symptoms are angina.  She is not that active however.  We will get an echocardiogram to evaluate her LV function.  Labs today to look at fluid.  Consideration of increasing Lasix and then following up with how she feels.   3  HL patient  remains on Lipitor 80 LDL in the summer was 75  we will check lipids again today with a not intake.  No changes for now.  4  HTN  BP is under good control  F/U in 3 months to see how she is doing      Current medicines are reviewed at length with the patient today.  The patient does not have concerns regarding medicines.  Signed, Dorris Carnes, MD  03/29/2018 8:40 AM    [address], [place], [place]  [address] Phone: [phone]; Fax: [phone]

## 2018-03-29 NOTE — Patient Instructions (Signed)
Medication Instructions:  No changes today If you need a refill on your cardiac medications before your next appointment, please call your pharmacy.   Lab work: Today: lipids, tsh, bnp, bmet, cbc If you have labs (blood work) drawn today and your tests are completely normal, you will receive your results only by: Marland Kitchen MyChart Message (if you have MyChart) OR . A paper copy in the mail If you have any lab test that is abnormal or we need to change your treatment, we will call you to review the results.  Testing/Procedures: Your physician has requested that you have an echocardiogram. Echocardiography is a painless test that uses sound waves to create images of your heart. It provides your doctor with information about the size and shape of your heart and how well your heart's chambers and valves are working. This procedure takes approximately one hour. There are no restrictions for this procedure.   Follow-Up: Your physician recommends that you schedule a follow-up appointment in: May, 2020 with Dr. Tenny Craw.  Any Other Special Instructions Will Be Listed Below (If Applicable).

## 2018-04-05 ENCOUNTER — Ambulatory Visit (HOSPITAL_COMMUNITY): Payer: Medicare Other | Attending: Cardiology

## 2018-04-05 ENCOUNTER — Other Ambulatory Visit (HOSPITAL_COMMUNITY): Payer: Medicare Other

## 2018-04-05 DIAGNOSIS — R0602 Shortness of breath: Secondary | ICD-10-CM | POA: Insufficient documentation

## 2018-04-05 DIAGNOSIS — R609 Edema, unspecified: Secondary | ICD-10-CM | POA: Insufficient documentation

## 2018-04-05 DIAGNOSIS — I2510 Atherosclerotic heart disease of native coronary artery without angina pectoris: Secondary | ICD-10-CM | POA: Diagnosis present

## 2018-04-09 ENCOUNTER — Other Ambulatory Visit: Payer: Self-pay | Admitting: *Deleted

## 2018-04-09 MED ORDER — FUROSEMIDE 20 MG PO TABS: 20.0000 mg | ORAL_TABLET | ORAL | 3 refills | Status: DC

## 2018-04-13 ENCOUNTER — Other Ambulatory Visit: Payer: Self-pay | Admitting: Internal Medicine

## 2018-05-05 ENCOUNTER — Other Ambulatory Visit: Payer: Self-pay | Admitting: Internal Medicine

## 2018-05-21 ENCOUNTER — Telehealth: Payer: Self-pay | Admitting: Internal Medicine

## 2018-05-21 MED ORDER — FUROSEMIDE 20 MG PO TABS: 20.0000 mg | ORAL_TABLET | Freq: Every day | ORAL | 3 refills | Status: DC

## 2018-05-21 NOTE — Telephone Encounter (Signed)
Patient has been taking lasix 20 mg every day. She is supposed to take one extra tablet (40 mg) twice a week.   Prescription was for every other day so she is running out.  Takes daily for feet/ankle swelling. Tries to limit sodium. Wears support stockings every day and will try to elevate legs when possible. I sent updated prescription to pharmacy for the daily dose with one extra tablet 2x per week. Will send to Dr. Tenny Craw for [facility]

## 2018-05-21 NOTE — Telephone Encounter (Signed)
New Message             *STAT* If patient is at the pharmacy, call can be transferred to refill team.   1. Which medications need to be refilled? (please list name of each medication and dose if known) (Lasix) Furosemide  2. Which pharmacy/location (including street and city if local pharmacy) [facility] [address] [facility] [place]  3. Do they need a 30 day or 90 day supply? 90   Patient states she is running out of her fluid pill Rx was not filled completely, she was given 45 pills and she takes them every other day, but she is having some swelling.

## 2018-05-30 ENCOUNTER — Other Ambulatory Visit: Payer: Self-pay | Admitting: Internal Medicine

## 2018-07-19 ENCOUNTER — Ambulatory Visit: Payer: Medicare Other | Admitting: Internal Medicine

## 2018-07-24 ENCOUNTER — Telehealth: Payer: Self-pay | Admitting: Internal Medicine

## 2018-07-24 NOTE — Telephone Encounter (Signed)
Pt. Doe snot have a smart phone. She will do a phone visit. She said that she did not mind coming in the next time Dr. Tenny Craw is in the office. She wants [place] to call her if Tenny Craw is in the office before her appt to switch her back to an in office visit.     Virtual Visit Pre-Appointment Phone Call  "(Name), I am calling you today to discuss your upcoming appointment. We are currently trying to limit exposure to the virus that causes COVID-19 by seeing patients at home rather than in the office."  1. "What is the BEST phone number to call the day of the visit?" - include this in appointment notes  2. Do you have or have access to (through a family member/friend) a smartphone with video capability that we can use for your visit?" a. If yes - list this number in appt notes as cell (if different from BEST phone #) and list the appointment type as a VIDEO visit in appointment notes b. If no - list the appointment type as a PHONE visit in appointment notes  3. Confirm consent - "In the setting of the current Covid19 crisis, you are scheduled for a (phone or video) visit with your provider on (date) at (time).  Just as we do with many in-office visits, in order for you to participate in this visit, we must obtain consent.  If you'd like, I can send this to your mychart (if signed up) or email for you to review.  Otherwise, I can obtain your verbal consent now.  All virtual visits are billed to your insurance company just like a normal visit would be.  By agreeing to a virtual visit, we'd like you to understand that the technology does not allow for your provider to perform an examination, and thus may limit your provider's ability to fully assess your condition. If your provider identifies any concerns that need to be evaluated in person, we will make arrangements to do so.  Finally, though the technology is pretty good, we cannot assure that it will always work on either your or our end, and in the setting of  a video visit, we may have to convert it to a phone-only visit.  In either situation, we cannot ensure that we have a secure connection.  Are you willing to proceed?"  YES  4. Advise patient to be prepared - "Two hours prior to your appointment, go ahead and check your blood pressure, pulse, oxygen saturation, and your weight (if you have the equipment to check those) and write them all down. When your visit starts, your provider will ask you for this information. If you have an Apple Watch or Kardia device, please plan to have heart rate information ready on the day of your appointment. Please have a pen and paper handy nearby the day of the visit as well."  5. Give patient instructions for MyChart download to smartphone OR Doximity/Doxy.me as below if video visit (depending on what platform provider is using)  6. Inform patient they will receive a phone call 15 minutes prior to their appointment time (may be from unknown caller ID) so they should be prepared to answer    TELEPHONE CALL NOTE  SCHYLER BUTIKOFER has been deemed a candidate for a follow-up tele-health visit to limit community exposure during the Covid-19 pandemic. I spoke with the patient via phone to ensure availability of phone/video source, confirm preferred email & phone number, and discuss instructions  and expectations.  I reminded Nada Maclachlanoris A Grunder to be prepared with any vital sign and/or heart rhythm information that could potentially be obtained via home monitoring, at the time of her visit. I reminded Nada Maclachlanoris A Atteberry to expect a phone call prior to her visit.  Scherrie BatemanShameeka Johnson 07/24/2018 3:01 PM   INSTRUCTIONS FOR DOWNLOADING THE MYCHART APP TO SMARTPHONE  - The patient must first make sure to have activated MyChart and know their login information - If Apple, go to [employer] Store and type in MyChart in the search bar and download the app. If Android, ask patient to go to [employer] Play Store and type in MidwayMyChart in the search bar and  download the app. The app is free but as with any other app downloads, their phone may require them to verify saved payment information or Apple/Android password.  - The patient will need to then log into the app with their MyChart username and password, and select [facility] as their healthcare provider to link the account. When it is time for your visit, go to the MyChart app, find appointments, and click Begin Video Visit. Be sure to Select Allow for your device to access the Microphone and Camera for your visit. You will then be connected, and your provider will be with you shortly.  **If they have any issues connecting, or need assistance please contact MyChart service desk [phone])**  **If using a computer, in order to ensure the best quality for their visit they will need to use either of the following Internet Browsers: [employer] Edge, or Google Chrome**  IF USING DOXIMITY or DOXY.ME - The patient will receive a link just prior to their visit by text.     FULL LENGTH CONSENT FOR TELE-HEALTH VISIT   I hereby voluntarily request, consent and authorize [facility] and its employed or contracted physicians, physician assistants, nurse practitioners or other licensed health care professionals (the Practitioner), to provide me with telemedicine health care services (the Services") as deemed necessary by the treating Practitioner. I acknowledge and consent to receive the Services by the Practitioner via telemedicine. I understand that the telemedicine visit will involve communicating with the Practitioner through live audiovisual communication technology and the disclosure of certain medical information by electronic transmission. I acknowledge that I have been given the opportunity to request an in-person assessment or other available alternative prior to the telemedicine visit and am voluntarily participating in the telemedicine visit.  I understand that I have the right to  withhold or withdraw my consent to the use of telemedicine in the course of my care at any time, without affecting my right to future care or treatment, and that the Practitioner or I may terminate the telemedicine visit at any time. I understand that I have the right to inspect all information obtained and/or recorded in the course of the telemedicine visit and may receive copies of available information for a reasonable fee.  I understand that some of the potential risks of receiving the Services via telemedicine include:   Delay or interruption in medical evaluation due to technological equipment failure or disruption;  Information transmitted may not be sufficient (e.g. poor resolution of images) to allow for appropriate medical decision making by the Practitioner; and/or   In rare instances, security protocols could fail, causing a breach of personal health information.  Furthermore, I acknowledge that it is my responsibility to provide information about my medical history, conditions and care that is complete and accurate to the  best of my ability. I acknowledge that Practitioner's advice, recommendations, and/or decision may be based on factors not within their control, such as incomplete or inaccurate data provided by me or distortions of diagnostic images or specimens that may result from electronic transmissions. I understand that the practice of medicine is not an exact science and that Practitioner makes no warranties or guarantees regarding treatment outcomes. I acknowledge that I will receive a copy of this consent concurrently upon execution via email to the email address I last provided but may also request a printed copy by calling the office of [facility].    I understand that my insurance will be billed for this visit.   I have read or had this consent read to me.  I understand the contents of this consent, which adequately explains the benefits and risks of the Services being  provided via telemedicine.   I have been provided ample opportunity to ask questions regarding this consent and the Services and have had my questions answered to my satisfaction.  I give my informed consent for the services to be provided through the use of telemedicine in my medical care  By participating in this telemedicine visit I agree to the above.

## 2018-07-26 NOTE — Telephone Encounter (Signed)
Per front desk staff member who called patient, the patient prefers in office visit and is aware that Dr. Tenny Craw will see her in the office on June 1.

## 2018-07-28 NOTE — Progress Notes (Signed)
.    Cardiology Office Note   Date:  07/29/2018   ID:  [name], DOB [birth year], MRN [id number]  PCP:  Jarome Matin, MD  Cardiologist:   Dietrich Pates, MD    F/U of CAD     History of Present Illness: [name] is a 85 y.o. female with a history of CAD (s/p CABG in 2000.  Myoview in 2013 was normal.   She was admitted to [facility] in October 2016 Complained of CP and    Myoview scan was normal    I saw her in Jan  2020   At that time she had some ankle swelling   No CP   SOme SOB   Echo ordered   This showed normal LV systolic function   Labs done   I recomm taking 1 to 2 lasix per week   She did this and it helped   Since seen she is doing OK   Deneis CP  Breathing is OK   Does get SOB with some activities   Slows and it goes away   No change   Has some mild LE edema  Goes down at night     Outpatient Medications Prior to Visit  Medication Sig Dispense Refill  . acetaminophen (TYLENOL) 500 MG tablet Take 1,000 mg by mouth 2 (two) times daily as needed for mild pain or moderate pain.     Marland Kitchen amLODipine (NORVASC) 10 MG tablet TAKE 1 TABLET BY MOUTH EVERY DAY 90 tablet 3  . aspirin 81 MG tablet Take 81 mg by mouth daily.      Marland Kitchen atorvastatin (LIPITOR) 80 MG tablet TAKE 1 TABLET BY MOUTH EVERY EVENING 90 tablet 1  . calcium carbonate (TUMS - DOSED IN MG ELEMENTAL CALCIUM) 500 MG chewable tablet Chew 1 tablet by mouth as needed for indigestion or heartburn.     . ergocalciferol (VITAMIN D2) 50000 UNITS capsule Take 50,000 Units by mouth once a week.      . esomeprazole (NEXIUM) 20 MG capsule Take 20 mg by mouth daily before breakfast.    . furosemide (LASIX) 20 MG tablet Take 1 tablet (20 mg total) by mouth daily. 90 tablet 3  . KLOR-CON M20 20 MEQ tablet TAKE 1 TABLET BY MOUTH EVERY DAY 90 tablet 3  . levothyroxine (SYNTHROID, LEVOTHROID) 75 MCG tablet Take 75 mcg by mouth daily.      Marland Kitchen lisinopril (PRINIVIL,ZESTRIL) 5 MG tablet TAKE 1 TABLET BY MOUTH EVERY DAY (Patient taking  differently: Take 1 tablet by mouth every day.) 30 tablet 6  . metoprolol tartrate (LOPRESSOR) 50 MG tablet TAKE 1 TABLET BY MOUTH TWICE A DAY (Patient taking differently: Take 25 mg by mouth 2 (two) times daily. ) 180 tablet 3  . nitroGLYCERIN (NITROSTAT) 0.4 MG SL tablet Place 1 tablet (0.4 mg total) under the tongue every 5 (five) minutes x 3 doses as needed for chest pain. 25 tablet 2   No facility-administered medications prior to visit.      Allergies:   Actos [pioglitazone hydrochloride] and Codeine   Past Medical History:  Diagnosis Date  . Anemia   . Coronary artery disease   . Diabetes mellitus   . DJD (degenerative joint disease)   . Dysrhythmia   . Edema   . GERD (gastroesophageal reflux disease)   . Hiatal hernia   . History of blood transfusion   . Hyperlipidemia   . Hypertension   . Hypothyroidism   .  IBS (irritable bowel syndrome)   . SVT (supraventricular tachycardia) (HCC)    with ablation    Past Surgical History:  Procedure Laterality Date  . APPENDECTOMY    . BREAST SURGERY     biopsy per left breast   . CARDIAC CATHETERIZATION    . CHOLECYSTECTOMY  2005  . CORONARY ARTERY BYPASS GRAFT  2000  . EYE SURGERY     cataract surgery bilat   . REPLACEMENT TOTAL KNEE  2009   left     Social History:  The patient  reports that she has never smoked. She has never used smokeless tobacco. She reports that she does not drink alcohol or use drugs.   Family History:  The patient's family history includes Breast cancer in her sister; Coronary artery disease in her brother; Diabetes in her sister; Heart attack (age of onset: 5865) in her father; Heart attack (age of onset: 3383) in her mother; Heart disease in an other family member.    ROS:  Please see the history of present illness. All other systems are reviewed and  Negative to the above problem except as noted.    PHYSICAL EXAM: VS:  BP (!) 157/63   Pulse 65   Ht 5\' 3"  (1.6 m)   Wt 149 lb 9.6 oz (67.9 kg)    SpO2 99%   BMI 26.50 kg/m   GEN: Well nourished, well developed, in no acute distress  HEENT: normal  Neck: JVP is normal NO, carotid bruits, or masses Cardiac: RRR;   No murmurs, rubs, or gallops, 1+ nonpitting  edema  Respiratory:  clear to auscultation bilaterally, normal work of breathing GI: soft, nontender, nondistended, + BS  No hepatomegaly  MS: no deformity Moving all extremities   Skin: warm and dry, no rash Neuro:  Strength and sensation are intact Psych: euthymic mood, full affect   EKG:  EKG is not ordered today    Lipid Panel    Component Value Date/Time   CHOL 166 03/29/2018 0911   TRIG 121 03/29/2018 0911   HDL 80 03/29/2018 0911   CHOLHDL 2.1 03/29/2018 0911   CHOLHDL 2.6 12/07/2015 0440   VLDL 24 12/07/2015 0440   LDLCALC 62 03/29/2018 0911      Wt Readings from Last 3 Encounters:  07/29/18 149 lb 9.6 oz (67.9 kg)  03/29/18 155 lb 6.4 oz (70.5 kg)  03/02/17 154 lb (69.9 kg)      ASSESSMENT AND PLAN:  1  CAD  No symptoms to suggest angina  Remote CABG   Myovue in 2013   Follow for now   2 LE edema   PT taking intermitt lasix    Helps   WIll check BMET    LVEF is normal  3  HL  Continue meds   4  HTN  BP is under good control  F/U next winter    REviewed safe covid practices      Current medicines are reviewed at length with the patient today.  The patient does not have concerns regarding medicines.  Signed, Dietrich PatesPaula Kaden Dunkel, MD  07/29/2018 10:46 AM    [facility] [address], [place], [place]  [address] Phone: [phone]; Fax: [phone]) [phone]

## 2018-07-29 ENCOUNTER — Other Ambulatory Visit: Payer: Self-pay

## 2018-07-29 ENCOUNTER — Ambulatory Visit: Payer: Medicare Other | Admitting: Internal Medicine

## 2018-07-29 ENCOUNTER — Encounter: Payer: Self-pay | Admitting: Internal Medicine

## 2018-07-29 VITALS — BP 157/63 | HR 65 | Ht 63.0 in | Wt 149.6 lb

## 2018-07-29 DIAGNOSIS — Z951 Presence of aortocoronary bypass graft: Secondary | ICD-10-CM | POA: Diagnosis not present

## 2018-07-29 DIAGNOSIS — E782 Mixed hyperlipidemia: Secondary | ICD-10-CM

## 2018-07-29 DIAGNOSIS — I10 Essential (primary) hypertension: Secondary | ICD-10-CM | POA: Diagnosis not present

## 2018-07-29 MED ORDER — METOPROLOL TARTRATE 25 MG PO TABS: 25.0000 mg | ORAL_TABLET | Freq: Two times a day (BID) | ORAL | 3 refills | Status: DC

## 2018-07-29 NOTE — Patient Instructions (Signed)
Medication Instructions:  No changes - continue with metoprolol tartrate 25 mg twice a day.  We have sent a refill to your pharmacy for this dose.  If you need a refill on your cardiac medications before your next appointment, please call your pharmacy.   Lab work: none If you have labs (blood work) drawn today and your tests are completely normal, you will receive your results only by: Marland Kitchen MyChart Message (if you have MyChart) OR . A paper copy in the mail If you have any lab test that is abnormal or we need to change your treatment, we will call you to review the results.  Testing/Procedures: none  Follow-Up: At [facility], you and your health needs are our priority.  As part of our continuing mission to provide you with exceptional heart care, we have created designated Provider Care Teams.  These Care Teams include your primary Cardiologist (physician) and Advanced Practice Providers (APPs -  Physician Assistants and Nurse Practitioners) who all work together to provide you with the care you need, when you need it. You will need a follow up appointment in:  6-7 months.  Please call our office 2 months in advance to schedule this appointment.  You may see Dr. Tenny Craw or one of the following Advanced Practice Providers on your designated Care Team: Tereso Newcomer, PA-C [place], [place] . Berton Bon, NP  Any Other Special Instructions Will Be Listed Below (If Applicable).

## 2018-08-02 ENCOUNTER — Other Ambulatory Visit: Payer: Self-pay | Admitting: Internal Medicine

## 2018-10-08 ENCOUNTER — Telehealth: Payer: Self-pay | Admitting: *Deleted

## 2018-10-08 DIAGNOSIS — I2510 Atherosclerotic heart disease of native coronary artery without angina pectoris: Secondary | ICD-10-CM

## 2018-10-08 DIAGNOSIS — E782 Mixed hyperlipidemia: Secondary | ICD-10-CM

## 2018-10-08 MED ORDER — EZETIMIBE 10 MG PO TABS: 5.0000 mg | ORAL_TABLET | Freq: Every day | ORAL | 3 refills | Status: DC

## 2018-10-08 NOTE — Telephone Encounter (Signed)
-----   Message from Fay Records, MD sent at 09/20/2018 12:09 PM EDT ----- See if patient would be willing to try Zetia, even 5 mg (1/2  )   May get LDL to below 70 AND we may be able to cut lipitor to 40 (savings) F/U lipids in 3 months

## 2018-10-08 NOTE — Telephone Encounter (Signed)
Spoke with patient.  She is willing to take zetia 5 mg daily and have fasting lipids checked in 3 months. [facility]  She is aware to split in half.  I offered to ask [facility] to [facility] them but she said she has a splitter and can do this.  She is aware of lab appointment on 01/08/19.  She will come before breakfast.

## 2018-10-10 ENCOUNTER — Other Ambulatory Visit (HOSPITAL_COMMUNITY): Payer: Self-pay

## 2018-10-11 ENCOUNTER — Other Ambulatory Visit: Payer: Self-pay

## 2018-10-11 ENCOUNTER — Ambulatory Visit (HOSPITAL_COMMUNITY)
Admission: RE | Admit: 2018-10-11 | Discharge: 2018-10-11 | Disposition: A | Discharge status: Home / Self Care | Payer: Medicare Other | Source: Ambulatory Visit | Attending: Internal Medicine | Admitting: Internal Medicine

## 2018-10-11 DIAGNOSIS — M810 Age-related osteoporosis without current pathological fracture: Secondary | ICD-10-CM | POA: Diagnosis not present

## 2018-10-11 MED ORDER — DENOSUMAB 60 MG/ML ~~LOC~~ SOSY
60.0000 mg | PREFILLED_SYRINGE | Freq: Once | SUBCUTANEOUS | Status: AC
  Administered 2018-10-11: 60 mg via SUBCUTANEOUS

## 2018-10-11 MED ORDER — DENOSUMAB 60 MG/ML ~~LOC~~ SOSY
PREFILLED_SYRINGE | SUBCUTANEOUS | Status: AC
  Filled 2018-10-11: qty 1

## 2018-11-20 ENCOUNTER — Other Ambulatory Visit: Payer: Self-pay | Admitting: Internal Medicine

## 2018-11-28 ENCOUNTER — Emergency Department (HOSPITAL_COMMUNITY): Payer: Medicare Other

## 2018-11-28 ENCOUNTER — Other Ambulatory Visit: Payer: Self-pay

## 2018-11-28 ENCOUNTER — Observation Stay (HOSPITAL_COMMUNITY)
Admission: EM | Admit: 2018-11-28 | Discharge: 2018-11-29 | Disposition: A | Discharge status: Home / Self Care | Payer: Medicare Other | Attending: Internal Medicine | Admitting: Internal Medicine

## 2018-11-28 DIAGNOSIS — E119 Type 2 diabetes mellitus without complications: Secondary | ICD-10-CM | POA: Diagnosis not present

## 2018-11-28 DIAGNOSIS — I700 Atherosclerosis of aorta: Secondary | ICD-10-CM | POA: Diagnosis not present

## 2018-11-28 DIAGNOSIS — Z885 Allergy status to narcotic agent status: Secondary | ICD-10-CM | POA: Diagnosis not present

## 2018-11-28 DIAGNOSIS — Z8249 Family history of ischemic heart disease and other diseases of the circulatory system: Secondary | ICD-10-CM | POA: Insufficient documentation

## 2018-11-28 DIAGNOSIS — R0789 Other chest pain: Principal | ICD-10-CM | POA: Insufficient documentation

## 2018-11-28 DIAGNOSIS — Z7902 Long term (current) use of antithrombotics/antiplatelets: Secondary | ICD-10-CM | POA: Insufficient documentation

## 2018-11-28 DIAGNOSIS — Z951 Presence of aortocoronary bypass graft: Secondary | ICD-10-CM | POA: Diagnosis not present

## 2018-11-28 DIAGNOSIS — Z20828 Contact with and (suspected) exposure to other viral communicable diseases: Secondary | ICD-10-CM | POA: Insufficient documentation

## 2018-11-28 DIAGNOSIS — Z79890 Hormone replacement therapy (postmenopausal): Secondary | ICD-10-CM | POA: Diagnosis not present

## 2018-11-28 DIAGNOSIS — K589 Irritable bowel syndrome without diarrhea: Secondary | ICD-10-CM | POA: Insufficient documentation

## 2018-11-28 DIAGNOSIS — D696 Thrombocytopenia, unspecified: Secondary | ICD-10-CM | POA: Diagnosis not present

## 2018-11-28 DIAGNOSIS — Z79899 Other long term (current) drug therapy: Secondary | ICD-10-CM | POA: Insufficient documentation

## 2018-11-28 DIAGNOSIS — Z888 Allergy status to other drugs, medicaments and biological substances status: Secondary | ICD-10-CM | POA: Insufficient documentation

## 2018-11-28 DIAGNOSIS — I119 Hypertensive heart disease without heart failure: Secondary | ICD-10-CM | POA: Diagnosis not present

## 2018-11-28 DIAGNOSIS — R079 Chest pain, unspecified: Secondary | ICD-10-CM | POA: Diagnosis present

## 2018-11-28 DIAGNOSIS — I440 Atrioventricular block, first degree: Secondary | ICD-10-CM | POA: Diagnosis not present

## 2018-11-28 DIAGNOSIS — R0602 Shortness of breath: Secondary | ICD-10-CM

## 2018-11-28 DIAGNOSIS — I4510 Unspecified right bundle-branch block: Secondary | ICD-10-CM | POA: Insufficient documentation

## 2018-11-28 DIAGNOSIS — R600 Localized edema: Secondary | ICD-10-CM | POA: Insufficient documentation

## 2018-11-28 DIAGNOSIS — Z833 Family history of diabetes mellitus: Secondary | ICD-10-CM | POA: Insufficient documentation

## 2018-11-28 DIAGNOSIS — K219 Gastro-esophageal reflux disease without esophagitis: Secondary | ICD-10-CM | POA: Diagnosis not present

## 2018-11-28 DIAGNOSIS — Z7982 Long term (current) use of aspirin: Secondary | ICD-10-CM | POA: Insufficient documentation

## 2018-11-28 DIAGNOSIS — E039 Hypothyroidism, unspecified: Secondary | ICD-10-CM | POA: Diagnosis not present

## 2018-11-28 DIAGNOSIS — E782 Mixed hyperlipidemia: Secondary | ICD-10-CM | POA: Diagnosis present

## 2018-11-28 DIAGNOSIS — I272 Pulmonary hypertension, unspecified: Secondary | ICD-10-CM | POA: Diagnosis not present

## 2018-11-28 DIAGNOSIS — I2510 Atherosclerotic heart disease of native coronary artery without angina pectoris: Secondary | ICD-10-CM | POA: Insufficient documentation

## 2018-11-28 DIAGNOSIS — Z96652 Presence of left artificial knee joint: Secondary | ICD-10-CM | POA: Diagnosis not present

## 2018-11-28 DIAGNOSIS — I10 Essential (primary) hypertension: Secondary | ICD-10-CM | POA: Diagnosis present

## 2018-11-28 DIAGNOSIS — R002 Palpitations: Secondary | ICD-10-CM

## 2018-11-28 LAB — COMPREHENSIVE METABOLIC PANEL
ALT: 12 U/L (ref 0–44)
AST: 19 U/L (ref 15–41)
Albumin: 3.9 g/dL (ref 3.5–5.0)
Alkaline Phosphatase: 39 U/L (ref 38–126)
Anion gap: 7 (ref 5–15)
BUN: 9 mg/dL (ref 8–23)
CO2: 24 mmol/L (ref 22–32)
Calcium: 9 mg/dL (ref 8.9–10.3)
Chloride: 100 mmol/L (ref 98–111)
Creatinine, Ser: 0.9 mg/dL (ref 0.44–1.00)
GFR calc Af Amer: >60 mL/min (ref >60)
GFR calc non Af Amer: 59 mL/min — ABNORMAL LOW (ref >60)
Glucose, Bld: 150 mg/dL — ABNORMAL HIGH (ref 70–99)
Potassium: 4.2 mmol/L (ref 3.5–5.1)
Sodium: 131 mmol/L — ABNORMAL LOW (ref 135–145)
Total Bilirubin: 1.2 mg/dL (ref 0.3–1.2)
Total Protein: 6.2 g/dL — ABNORMAL LOW (ref 6.5–8.1)

## 2018-11-28 LAB — CBC WITH DIFFERENTIAL/PLATELET
Abs Immature Granulocytes: 0.07 10*3/uL (ref 0.00–0.07)
Basophils Absolute: 0.1 10*3/uL (ref 0.0–0.1)
Basophils Relative: 1 %
Eosinophils Absolute: 0.2 10*3/uL (ref 0.0–0.5)
Eosinophils Relative: 3 %
HCT: 35.6 % — ABNORMAL LOW (ref 36.0–46.0)
Hemoglobin: 13 g/dL (ref 12.0–15.0)
Immature Granulocytes: 1 %
Lymphocytes Relative: 24 %
Lymphs Abs: 1.8 10*3/uL (ref 0.7–4.0)
MCH: 33.4 pg (ref 26.0–34.0)
MCHC: 36.5 g/dL — ABNORMAL HIGH (ref 30.0–36.0)
MCV: 91.5 fL (ref 80.0–100.0)
Monocytes Absolute: 1 10*3/uL (ref 0.1–1.0)
Monocytes Relative: 13 %
Neutro Abs: 4.4 10*3/uL (ref 1.7–7.7)
Neutrophils Relative %: 58 %
Platelets: 147 10*3/uL — ABNORMAL LOW (ref 150–400)
RBC: 3.89 MIL/uL (ref 3.87–5.11)
RDW: 12.7 % (ref 11.5–15.5)
WBC: 7.5 10*3/uL (ref 4.0–10.5)
nRBC: 0 % (ref 0.0–0.2)

## 2018-11-28 LAB — TROPONIN I (HIGH SENSITIVITY): Troponin I (High Sensitivity): 14 ng/L (ref <18)

## 2018-11-28 NOTE — ED Provider Notes (Signed)
[name] Provider Note   CSN: [id number] Arrival date & time: 11/28/18  2043     History   Chief Complaint Chief Complaint  Patient presents with  . Chest Pain    HPI [name] is a 85 y.o. female.     HPI  The patient complains of an episode of lightheadedness tonight around 8pm. She says at the time her head 'felt tight, felt funny' and she subsequently had an episode of chest tightness with shortness of breath. Has never felt like this before. She has a history of panic attacks. She states she has a history of exertional angina. She additionally endorsed some nausea and heart palpitations. No emesis or diaphoresis.   She has a history of CAD s/p CABG years ago in 2000. Her last echocardiogram was in February 2020 and was normal with an EF of 55-60%. She follows with cardiology.   On arrival to the ED, the patient's symptoms had resolved. She was given 243mg  ASA by EMS and took 81mg  ASA this AM.   She also has a history of SVT and is status post ablation. She also has a history of panic attacks.   Past Medical History:  Diagnosis Date  . Anemia   . Coronary artery disease   . Diabetes mellitus   . DJD (degenerative joint disease)   . Dysrhythmia   . Edema   . GERD (gastroesophageal reflux disease)   . Hiatal hernia   . History of blood transfusion   . Hyperlipidemia   . Hypertension   . Hypothyroidism   . IBS (irritable bowel syndrome)   . SVT (supraventricular tachycardia) (HCC)    with ablation    Patient Active Problem List   Diagnosis Date Noted  . Chest pain 11/28/2018  . Chest pain on breathing 12/06/2015  . Exertional chest pain   . COUGH 06/24/2009  . HYPOPOTASSEMIA 12/31/2008  . Pure hypercholesterolemia 12/16/2008  . Hyperlipidemia, mixed 08/28/2008  . HYPERTENSION, BENIGN 08/28/2008  . S/P CABG x 3 08/28/2008  . GERD 10/16/2007    Past Surgical History:  Procedure Laterality Date  .  APPENDECTOMY    . BREAST SURGERY     biopsy per left breast   . CARDIAC CATHETERIZATION    . CHOLECYSTECTOMY  2005  . CORONARY ARTERY BYPASS GRAFT  2000  . EYE SURGERY     cataract surgery bilat   . REPLACEMENT TOTAL KNEE  2009   left     OB History   No obstetric history on file.      Home Medications    Prior to Admission medications   Medication Sig Start Date End Date Taking? Authorizing Provider  acetaminophen (TYLENOL) 500 MG tablet Take 1,000 mg by mouth 2 (two) times daily as needed for mild pain or moderate pain.    Yes [provider]  amLODipine (NORVASC) 10 MG tablet TAKE 1 TABLET BY MOUTH EVERY DAY Patient taking differently: Take 10 mg by mouth daily.  04/16/18  Yes Pricilla Riffleoss, Paula V, MD  aspirin 81 MG tablet Take 81 mg by mouth daily.     Yes [provider]  atorvastatin (LIPITOR) 80 MG tablet TAKE 1 TABLET BY MOUTH EVERY DAY IN THE EVENING Patient taking differently: Take 80 mg by mouth every evening.  11/20/18  Yes Pricilla Riffleoss, Paula V, MD  calcium carbonate (TUMS - DOSED IN MG ELEMENTAL CALCIUM) 500 MG chewable tablet Chew 1 tablet by mouth as needed for indigestion  or heartburn.    Yes [provider]  ergocalciferol (VITAMIN D2) 50000 UNITS capsule Take 50,000 Units by mouth once a week.     Yes [provider]  esomeprazole (NEXIUM) 20 MG capsule Take 20 mg by mouth daily before breakfast.   Yes [provider]  ezetimibe (ZETIA) 10 MG tablet Take 0.5 tablets (5 mg total) by mouth daily. 10/08/18  Yes Fay Records, MD  furosemide (LASIX) 20 MG tablet Take 1 tablet (20 mg total) by mouth daily. 05/21/18  Yes Fay Records, MD  KLOR-CON M20 20 MEQ tablet TAKE 1 TABLET BY MOUTH EVERY DAY Patient taking differently: Take 20 mEq by mouth daily.  05/06/18  Yes Fay Records, MD  levothyroxine (SYNTHROID, LEVOTHROID) 75 MCG tablet Take 75 mcg by mouth daily.     Yes [provider]  lisinopril (PRINIVIL,ZESTRIL) 5 MG tablet TAKE  1 TABLET BY MOUTH EVERY DAY Patient taking differently: Take 5 mg by mouth daily.  12/19/11  Yes Josue Hector, MD  metoprolol tartrate (LOPRESSOR) 25 MG tablet Take 1 tablet (25 mg total) by mouth 2 (two) times daily. 07/29/18  Yes Fay Records, MD  nitroGLYCERIN (NITROSTAT) 0.4 MG SL tablet Place 1 tablet (0.4 mg total) under the tongue every 5 (five) minutes x 3 doses as needed for chest pain. 12/07/15  Yes Strader, Fransisco Hertz, PA-C    Family History Family History  Problem Relation Age of Onset  . Heart attack Mother 57       died  . Heart attack Father 68       died  . Breast cancer Sister   . Diabetes Sister        and brother  . Heart disease Other        sister, father and brother  . Coronary artery disease Brother        PCI    Social History Social History   Tobacco Use  . Smoking status: Never Smoker  . Smokeless tobacco: Never Used  Substance Use Topics  . Alcohol use: No  . Drug use: No     Allergies   Actos [pioglitazone hydrochloride] and Codeine   Review of Systems Review of Systems  Constitutional: Negative for chills, diaphoresis and fever.  Eyes: Negative for visual disturbance.  Respiratory: Positive for chest tightness. Negative for cough and shortness of breath.   Cardiovascular: Negative for chest pain and palpitations.  Gastrointestinal: Negative for abdominal pain, nausea and vomiting.  Genitourinary: Negative for dysuria and hematuria.  Musculoskeletal: Negative for arthralgias and back pain.  Skin: Negative for color change and rash.  Neurological: Positive for light-headedness. Negative for seizures and syncope.  All other systems reviewed and are negative.    Physical Exam Updated Vital Signs BP (!) 146/67 (BP Location: Right Arm)   Pulse 66   Temp 97.7 F (36.5 C) (Oral)   Resp 16   Ht 5' 3.5" (1.613 m)   Wt 64.4 kg   SpO2 100%   BMI 24.76 kg/m   Physical Exam Vitals signs and nursing note reviewed.  Constitutional:       General: She is not in acute distress.    Appearance: She is well-developed.  HENT:     Head: Normocephalic and atraumatic.  Eyes:     Conjunctiva/sclera: Conjunctivae normal.  Neck:     Musculoskeletal: Neck supple.  Cardiovascular:     Rate and Rhythm: Normal rate and regular rhythm.  Heart sounds: No murmur.  Pulmonary:     Effort: Pulmonary effort is normal. No respiratory distress.     Breath sounds: Normal breath sounds.  Abdominal:     Palpations: Abdomen is soft.     Tenderness: There is no abdominal tenderness.  Musculoskeletal:     Right lower leg: No edema.     Left lower leg: No edema.  Skin:    General: Skin is warm and dry.  Neurological:     General: No focal deficit present.     Mental Status: She is alert and oriented to person, place, and time.     Cranial Nerves: No cranial nerve deficit.     Motor: No weakness.      ED Treatments / Results  Labs (all labs ordered are listed, but only abnormal results are displayed) Labs Reviewed  CBC WITH DIFFERENTIAL/PLATELET - Abnormal; Notable for the following components:      Result Value   HCT 35.6 (*)    MCHC 36.5 (*)    Platelets 147 (*)    All other components within normal limits  COMPREHENSIVE METABOLIC PANEL - Abnormal; Notable for the following components:   Sodium 131 (*)    Glucose, Bld 150 (*)    Total Protein 6.2 (*)    GFR calc non Af Amer 59 (*)    All other components within normal limits  TROPONIN I (HIGH SENSITIVITY) - Abnormal; Notable for the following components:   Troponin I (High Sensitivity) 30 (*)    All other components within normal limits  NOVEL CORONAVIRUS, NAA (HOSP ORDER, SEND-OUT TO REF LAB; TAT 18-24 HRS)  TSH  TROPONIN I (HIGH SENSITIVITY)    EKG EKG Interpretation  Date/Time:  Thursday November 28 2018 20:48:11 EDT Ventricular Rate:  83 PR Interval:    QRS Duration: 121 QT Interval:  406 QTC Calculation: 478 R Axis:   21 Text Interpretation:  Sinus  rhythm Prolonged PR interval Right bundle branch block More exagerated TW inversions than previous in anterior leads Confirmed by Alvira Monday ([id number]) on 11/28/2018 10:46:46 PM   Radiology Dg Chest 2 View  Result Date: 11/28/2018 CLINICAL DATA:  Chest tightness. EXAM: CHEST - 2 VIEW COMPARISON:  12/06/2015 FINDINGS: Post median sternotomy and CABG. Stable upper normal heart size with unchanged mediastinal contours. Atherosclerosis of the thoracic aorta. No pulmonary edema, focal airspace disease, pleural effusion or pneumothorax. No acute osseous abnormalities are seen. IMPRESSION: 1. No acute abnormality or change from 2017. 2. Post CABG with aortic atherosclerosis (ICD10-I70.0). Electronically Signed   By: Narda Rutherford M.D.   On: 11/28/2018 21:24    Procedures Procedures (including critical care time)  Medications Ordered in ED Medications  aspirin EC tablet 81 mg (has no administration in time range)  amLODipine (NORVASC) tablet 10 mg (has no administration in time range)  atorvastatin (LIPITOR) tablet 80 mg (has no administration in time range)  ezetimibe (ZETIA) tablet 5 mg (has no administration in time range)  furosemide (LASIX) tablet 20 mg (has no administration in time range)  lisinopril (ZESTRIL) tablet 5 mg (has no administration in time range)  metoprolol tartrate (LOPRESSOR) tablet 25 mg (has no administration in time range)  nitroGLYCERIN (NITROSTAT) SL tablet 0.4 mg (has no administration in time range)  levothyroxine (SYNTHROID) tablet 75 mcg (75 mcg Oral Given 11/29/18 0644)  calcium carbonate (TUMS - dosed in mg elemental calcium) chewable tablet 200 mg of elemental calcium (has no administration in time range)  potassium chloride SA (  KLOR-CON) CR tablet 20 mEq (has no administration in time range)  acetaminophen (TYLENOL) tablet 650 mg (has no administration in time range)  ondansetron (ZOFRAN) injection 4 mg (has no administration in time range)  enoxaparin  (LOVENOX) injection 40 mg (40 mg Subcutaneous Given 11/29/18 0643)     Initial Impression / Assessment and Plan / ED Course  I have reviewed the triage vital signs and the nursing notes.  Pertinent labs & imaging results that were available during my care of the patient were reviewed by me and considered in my medical decision making (see chart for details).        The patient has a history of CAD (status post CABG in 2000) and SVT s/p ablation who presents with chest tightness, nausea, shortness of breath, and heart palpitations. Given the patient's cardiac history, concern for ACS vs arrhythmia vs anxiety. Her symptoms had resolved on arrival to the ED without intervention. EKG with NSR without ischemic changes, CXR unremarkable. Initial troponins 14 and 30. Other labs grossly unremarkable. Given the patient's cardiac history, the decision was made to admit the patient for observation and telemetry.  Hospitalist medicine consulted for admission.  Final Clinical Impressions(s) / ED Diagnoses   Final diagnoses:  Chest pressure  Shortness of breath  Heart palpitations    ED Discharge Orders    None       Ernie Avena, MD 11/29/18 5638    Alvira Monday, MD 11/29/18 2303

## 2018-11-28 NOTE — ED Notes (Signed)
Patient transported to X-ray 

## 2018-11-28 NOTE — ED Triage Notes (Signed)
Pt BIB [facility] for chest tightness that has been off and on a few times today while moving around.  Pt given 243 mg ASA by EMS Pt took 81 mg ASA this morning. Hx of panic attacks.

## 2018-11-29 ENCOUNTER — Other Ambulatory Visit: Payer: Self-pay

## 2018-11-29 ENCOUNTER — Observation Stay (HOSPITAL_BASED_OUTPATIENT_CLINIC_OR_DEPARTMENT_OTHER): Payer: Medicare Other

## 2018-11-29 ENCOUNTER — Encounter (HOSPITAL_COMMUNITY): Payer: Self-pay | Admitting: Internal Medicine

## 2018-11-29 ENCOUNTER — Other Ambulatory Visit: Payer: Self-pay | Admitting: Physician Assistant

## 2018-11-29 DIAGNOSIS — R002 Palpitations: Secondary | ICD-10-CM

## 2018-11-29 DIAGNOSIS — Z20828 Contact with and (suspected) exposure to other viral communicable diseases: Secondary | ICD-10-CM | POA: Diagnosis not present

## 2018-11-29 DIAGNOSIS — E782 Mixed hyperlipidemia: Secondary | ICD-10-CM | POA: Diagnosis not present

## 2018-11-29 DIAGNOSIS — Z951 Presence of aortocoronary bypass graft: Secondary | ICD-10-CM

## 2018-11-29 DIAGNOSIS — I119 Hypertensive heart disease without heart failure: Secondary | ICD-10-CM | POA: Diagnosis not present

## 2018-11-29 DIAGNOSIS — R0789 Other chest pain: Secondary | ICD-10-CM | POA: Diagnosis not present

## 2018-11-29 DIAGNOSIS — I10 Essential (primary) hypertension: Secondary | ICD-10-CM

## 2018-11-29 DIAGNOSIS — R778 Other specified abnormalities of plasma proteins: Secondary | ICD-10-CM | POA: Diagnosis not present

## 2018-11-29 DIAGNOSIS — R079 Chest pain, unspecified: Secondary | ICD-10-CM

## 2018-11-29 LAB — NM MYOCAR MULTI W/SPECT W/WALL MOTION / EF
Estimated workload: 1 METS
Exercise duration (min): 5 min
Exercise duration (sec): 34 s
MPHR: 137 {beats}/min
Peak HR: 89 {beats}/min
Percent HR: 64 %
Rest HR: 58 {beats}/min

## 2018-11-29 LAB — TROPONIN I (HIGH SENSITIVITY)
Troponin I (High Sensitivity): 17 ng/L (ref <18)
Troponin I (High Sensitivity): 30 ng/L — ABNORMAL HIGH (ref <18)

## 2018-11-29 LAB — TSH: TSH: 0.869 u[IU]/mL (ref 0.350–4.500)

## 2018-11-29 MED ORDER — METOPROLOL TARTRATE 25 MG PO TABS
25.0000 mg | ORAL_TABLET | Freq: Two times a day (BID) | ORAL | Status: DC
  Administered 2018-11-29: 25 mg via ORAL
  Filled 2018-11-29: qty 1

## 2018-11-29 MED ORDER — ACETAMINOPHEN 325 MG PO TABS
650.0000 mg | ORAL_TABLET | ORAL | Status: DC | PRN
  Administered 2018-11-29: 650 mg via ORAL
  Filled 2018-11-29: qty 2

## 2018-11-29 MED ORDER — ONDANSETRON HCL 4 MG/2ML IJ SOLN: 4.0000 mg | Freq: Four times a day (QID) | INTRAMUSCULAR | Status: DC | PRN

## 2018-11-29 MED ORDER — EZETIMIBE 10 MG PO TABS
5.0000 mg | ORAL_TABLET | Freq: Every day | ORAL | Status: DC
  Administered 2018-11-29: 5 mg via ORAL
  Filled 2018-11-29: qty 1

## 2018-11-29 MED ORDER — REGADENOSON 0.4 MG/5ML IV SOLN
INTRAVENOUS | Status: AC
  Filled 2018-11-29: qty 5

## 2018-11-29 MED ORDER — LISINOPRIL 10 MG PO TABS
5.0000 mg | ORAL_TABLET | Freq: Every day | ORAL | Status: DC
  Administered 2018-11-29: 5 mg via ORAL
  Filled 2018-11-29: qty 1

## 2018-11-29 MED ORDER — CALCIUM CARBONATE ANTACID 500 MG PO CHEW: 1.0000 | CHEWABLE_TABLET | ORAL | Status: DC | PRN

## 2018-11-29 MED ORDER — AMLODIPINE BESYLATE 5 MG PO TABS
10.0000 mg | ORAL_TABLET | Freq: Every day | ORAL | Status: DC
  Administered 2018-11-29: 10 mg via ORAL
  Filled 2018-11-29: qty 2

## 2018-11-29 MED ORDER — TECHNETIUM TC 99M TETROFOSMIN IV KIT
30.0000 | PACK | Freq: Once | INTRAVENOUS | Status: AC | PRN
  Administered 2018-11-29: 30 via INTRAVENOUS

## 2018-11-29 MED ORDER — TECHNETIUM TC 99M TETROFOSMIN IV KIT
10.0000 | PACK | Freq: Once | INTRAVENOUS | Status: AC | PRN
  Administered 2018-11-29: 10 via INTRAVENOUS

## 2018-11-29 MED ORDER — ENOXAPARIN SODIUM 40 MG/0.4ML ~~LOC~~ SOLN
40.0000 mg | SUBCUTANEOUS | Status: DC
  Administered 2018-11-29: 07:00:00 40 mg via SUBCUTANEOUS
  Filled 2018-11-29: qty 0.4

## 2018-11-29 MED ORDER — POTASSIUM CHLORIDE CRYS ER 20 MEQ PO TBCR: 20.0000 meq | EXTENDED_RELEASE_TABLET | Freq: Every day | ORAL | Status: DC

## 2018-11-29 MED ORDER — FUROSEMIDE 20 MG PO TABS: 20.0000 mg | ORAL_TABLET | Freq: Every day | ORAL | Status: DC

## 2018-11-29 MED ORDER — ATORVASTATIN CALCIUM 80 MG PO TABS
80.0000 mg | ORAL_TABLET | Freq: Every evening | ORAL | Status: DC
  Administered 2018-11-29: 17:00:00 80 mg via ORAL
  Filled 2018-11-29: qty 1

## 2018-11-29 MED ORDER — NITROGLYCERIN 0.4 MG SL SUBL: 0.4000 mg | SUBLINGUAL_TABLET | SUBLINGUAL | Status: DC | PRN

## 2018-11-29 MED ORDER — REGADENOSON 0.4 MG/5ML IV SOLN
0.4000 mg | Freq: Once | INTRAVENOUS | Status: AC
  Administered 2018-11-29: 0.4 mg via INTRAVENOUS

## 2018-11-29 MED ORDER — LEVOTHYROXINE SODIUM 75 MCG PO TABS
75.0000 ug | ORAL_TABLET | Freq: Every day | ORAL | Status: DC
  Administered 2018-11-29: 07:00:00 75 ug via ORAL
  Filled 2018-11-29: qty 1

## 2018-11-29 MED ORDER — ASPIRIN EC 81 MG PO TBEC
81.0000 mg | DELAYED_RELEASE_TABLET | Freq: Every day | ORAL | Status: DC
  Administered 2018-11-29: 09:00:00 81 mg via ORAL
  Filled 2018-11-29: qty 1

## 2018-11-29 MED ORDER — ASPIRIN EC 81 MG PO TBEC: 81.0000 mg | DELAYED_RELEASE_TABLET | Freq: Every day | ORAL | Status: DC

## 2018-11-29 NOTE — Discharge Summary (Signed)
Physician Discharge Summary  Aimee MCCULLEY ZOX:[id number] DOB: 1932/04/03 DOA: 11/28/2018  PCP: Leanna Battles, MD  Admit date: 11/28/2018 Discharge date: 12/02/2018  Admitted From: home Disposition:  home   Recommendations for Outpatient Follow-up:  1. F/u with cardiology for cardiac monitor    Discharge Condition:  stable   CODE STATUS:  Full code   Diet recommendation:  Heart healthy Consultations:  cardiology    Discharge Diagnoses:  Principal Problem:   Chest pain Active Problems:   Hyperlipidemia, mixed   HYPERTENSION, BENIGN   S/P CABG x 3    Brief Summary: JACOLE [name] is a 85 y.o. female with history of CAD status post CABG, hypertension, hyperlipidemia, hypothyroidism was brought to the ER after patient started experiencing dizziness palpitation and chest tightness with shortness of breath last night at home.  Patient states around 7 PM last night while she was walking around at home patient started having sudden onset of dizziness palpitation chest tightness and shortness of breath lasted until patient was brought into the hospital by the EMS.  Denies any abdominal pain no vomiting had some nausea.  Symptoms resolved without intervention.  Hospital Course:  Chest pain, palpitations and dizziness - h/o CABG - Troponin: 14>>30>>17 - evaluated by cardiology - underwent a myoview stress test which is negative - h/o SVT s/p ablation- cardiology plans to arrange a cardiac monitor for her - she has not had recurrent episodes in the hospital  HTN - cont all home meds unchanged  HLD - cont Statin and Zetia   Discharge Exam: Vitals:   11/29/18 1445 11/29/18 1654  BP: (!) 116/57 (!) 145/60  Pulse: 67 (!) 58  Resp: 15 16  Temp: 98.5 F (36.9 C) 97.7 F (36.5 C)  SpO2: 100% 99%   Vitals:   11/29/18 1306 11/29/18 1307 11/29/18 1445 11/29/18 1654  BP: (!) 146/61 (!) 146/60 (!) 116/57 (!) 145/60  Pulse:   67 (!) 58  Resp:   15 16  Temp:   98.5 F (36.9  C) 97.7 F (36.5 C)  TempSrc:   Oral Oral  SpO2:   100% 99%  Weight:      Height:        General: Pt is alert, awake, not in acute distress Cardiovascular: RRR, S1/S2 +, no rubs, no gallops Respiratory: CTA bilaterally, no wheezing, no rhonchi Abdominal: Soft, NT, ND, bowel sounds + Extremities: no edema, no cyanosis   Discharge Instructions  Discharge Instructions    Diet - low sodium heart healthy   Complete by: As directed    Increase activity slowly   Complete by: As directed      Allergies as of 11/29/2018      Reactions   Actos [pioglitazone Hydrochloride] Swelling   Edema.   Codeine    Visual hallucinations.       Medication List    TAKE these medications   acetaminophen 500 MG tablet Commonly known as: TYLENOL Take 1,000 mg by mouth 2 (two) times daily as needed for mild pain or moderate pain.   amLODipine 10 MG tablet Commonly known as: NORVASC TAKE 1 TABLET BY MOUTH EVERY DAY   aspirin 81 MG tablet Take 81 mg by mouth daily.   atorvastatin 80 MG tablet Commonly known as: LIPITOR TAKE 1 TABLET BY MOUTH EVERY DAY IN THE EVENING What changed:   how much to take  how to take this   calcium carbonate 500 MG chewable tablet Commonly known as: TUMS - dosed in  mg elemental calcium Chew 1 tablet by mouth as needed for indigestion or heartburn.   ergocalciferol 1.25 MG (50000 UT) capsule Commonly known as: VITAMIN D2 Take 50,000 Units by mouth once a week.   esomeprazole 20 MG capsule Commonly known as: NEXIUM Take 20 mg by mouth daily before breakfast.   ezetimibe 10 MG tablet Commonly known as: ZETIA Take 0.5 tablets (5 mg total) by mouth daily.   furosemide 20 MG tablet Commonly known as: LASIX Take 1 tablet (20 mg total) by mouth daily.   Klor-Con M20 20 MEQ tablet Generic drug: potassium chloride SA TAKE 1 TABLET BY MOUTH EVERY DAY What changed: how much to take   levothyroxine 75 MCG tablet Commonly known as: SYNTHROID Take 75  mcg by mouth daily.   lisinopril 5 MG tablet Commonly known as: ZESTRIL TAKE 1 TABLET BY MOUTH EVERY DAY   metoprolol tartrate 25 MG tablet Commonly known as: LOPRESSOR Take 1 tablet (25 mg total) by mouth 2 (two) times daily.   nitroGLYCERIN 0.4 MG SL tablet Commonly known as: NITROSTAT Place 1 tablet (0.4 mg total) under the tongue every 5 (five) minutes x 3 doses as needed for chest pain.      Follow-up Information    Pricilla Riffle, MD Follow up.   Specialty: Cardiology Why: [facility] - the office will call you to arrange a heart monitor to wear for palpitations and will also schedule you for a follow-up appointment. Contact information: [address] [place] [address] [phone]          Allergies  Allergen Reactions  . Actos [Pioglitazone Hydrochloride] Swelling    Edema.  . Codeine     Visual hallucinations.      Procedures/Studies:    Dg Chest 2 View  Result Date: 11/28/2018 CLINICAL DATA:  Chest tightness. EXAM: CHEST - 2 VIEW COMPARISON:  12/06/2015 FINDINGS: Post median sternotomy and CABG. Stable upper normal heart size with unchanged mediastinal contours. Atherosclerosis of the thoracic aorta. No pulmonary edema, focal airspace disease, pleural effusion or pneumothorax. No acute osseous abnormalities are seen. IMPRESSION: 1. No acute abnormality or change from 2017. 2. Post CABG with aortic atherosclerosis (ICD10-I70.0). Electronically Signed   By: Narda Rutherford M.D.   On: 11/28/2018 21:24   Nm Myocar Multi W/spect W/wall Motion / Ef  Result Date: 11/29/2018 CLINICAL DATA:  Acute coronary syndrome possible. Negative troponins. EXAM: MYOCARDIAL IMAGING WITH SPECT (REST AND PHARMACOLOGIC-STRESS) GATED LEFT VENTRICULAR WALL MOTION STUDY LEFT VENTRICULAR EJECTION FRACTION TECHNIQUE: Standard myocardial SPECT imaging was performed after resting intravenous injection of 10 mCi Tc-36m Myoview. Subsequently, intravenous infusion of  Lexiscan was performed under the supervision of the Cardiology staff. At peak effect of the drug, 30 mCi Tc-75m Myoview was injected intravenously and standard myocardial SPECT imaging was performed. Quantitative gated imaging was also performed to evaluate left ventricular wall motion, and estimate left ventricular ejection fraction. COMPARISON:  12/07/2015 FINDINGS: Perfusion: No decreased activity in the left ventricle on stress imaging to suggest reversible ischemia or infarction. Wall Motion: Mild hypokinesis involving the anterior septum. Left Ventricular Ejection Fraction: 70 % End diastolic volume 81 ml End systolic volume 24 ml IMPRESSION: 1. No reversible ischemia or infarction. 2. Mild anterior septal hypokinesis. 3. Left ventricular ejection fraction 70% 4. Non invasive risk stratification*: Low *2012 Appropriate Use Criteria for Coronary Revascularization Focused Update: J Am Coll Cardiol. 2012;59(9):857-881. http://content.dementiazones.com.aspx?articleid=[email or web address] Electronically Signed   By: Signa Kell M.D.   On: 11/29/2018 15:56  The results of significant diagnostics from this hospitalization (including imaging, microbiology, ancillary and laboratory) are listed below for reference.     Microbiology: Recent Results (from the past 240 hour(s))  Novel Coronavirus, NAA (Hosp order, Send-out to Ref Lab; TAT 18-24 hrs     Status: None   Collection Time: 11/28/18 11:35 PM   Specimen: Nasopharyngeal Swab; Respiratory  Result Value Ref Range Status   SARS-CoV-2, NAA NOT DETECTED NOT DETECTED Final    Comment: (NOTE) This nucleic acid amplification test was developed and its performance characteristics determined by [employer] [facility]. Nucleic acid amplification tests include PCR and TMA. This test has not been FDA cleared or approved. This test has been authorized by FDA under an Emergency Use Authorization (EUA). This test is only authorized for the duration of time the  declaration that circumstances exist justifying the authorization of the emergency use of in vitro diagnostic tests for detection of SARS-CoV-2 virus and/or diagnosis of COVID-19 infection under section 564(b)(1) of the Act, 21 U.S.C. 161WRU-0(A) (1), unless the authorization is terminated or revoked sooner. When diagnostic testing is negative, the possibility of a false negative result should be considered in the context of a patient's recent exposures and the presence of clinical signs and symptoms consistent with COVID-19. An individual without symptoms of COVID- 19 and who is not shedding SARS-CoV-2 vi rus would expect to have a negative (not detected) result in this assay. Performed At: [facility] [address] [place], [place] [id number] Jolene Schimke MD YN:[id number]    Coronavirus Source NASOPHARYNGEAL  Final    Comment: Performed at [facility], [address], [place], [place] [address]     Labs: BNP (last 3 results) No results for input(s): BNP in the last 8760 hours. Basic Metabolic Panel: Recent Labs  Lab 11/28/18 2139  NA 131*  K 4.2  CL 100  CO2 24  GLUCOSE 150*  BUN 9  CREATININE 0.90  CALCIUM 9.0   Liver Function Tests: Recent Labs  Lab 11/28/18 2139  AST 19  ALT 12  ALKPHOS 39  BILITOT 1.2  PROT 6.2*  ALBUMIN 3.9   No results for input(s): LIPASE, AMYLASE in the last 168 hours. No results for input(s): AMMONIA in the last 168 hours. CBC: Recent Labs  Lab 11/28/18 2139  WBC 7.5  NEUTROABS 4.4  HGB 13.0  HCT 35.6*  MCV 91.5  PLT 147*   Cardiac Enzymes: No results for input(s): CKTOTAL, CKMB, CKMBINDEX, TROPONINI in the last 168 hours. BNP: Invalid input(s): POCBNP CBG: No results for input(s): GLUCAP in the last 168 hours. D-Dimer No results for input(s): DDIMER in the last 72 hours. Hgb A1c No results for input(s): HGBA1C in the last 72 hours. Lipid Profile No results for input(s): CHOL, HDL, LDLCALC, TRIG,  CHOLHDL, LDLDIRECT in the last 72 hours. Thyroid function studies No results for input(s): TSH, T4TOTAL, T3FREE, THYROIDAB in the last 72 hours.  Invalid input(s): FREET3 Anemia work up No results for input(s): VITAMINB12, FOLATE, FERRITIN, TIBC, IRON, RETICCTPCT in the last 72 hours. Urinalysis    Component Value Date/Time   COLORURINE YELLOW 11/18/2008 1608   APPEARANCEUR CLEAR 11/18/2008 1608   LABSPEC 1.006 11/18/2008 1608   PHURINE 7.0 11/18/2008 1608   GLUCOSEU NEGATIVE 11/18/2008 1608   HGBUR NEGATIVE 11/18/2008 1608   BILIRUBINUR NEGATIVE 11/18/2008 1608   KETONESUR NEGATIVE 11/18/2008 1608   PROTEINUR NEGATIVE 11/18/2008 1608   UROBILINOGEN 0.2 11/18/2008 1608   NITRITE NEGATIVE 11/18/2008 1608  LEUKOCYTESUR  11/18/2008 1608    NEGATIVE MICROSCOPIC NOT DONE ON URINES WITH NEGATIVE PROTEIN, BLOOD, LEUKOCYTES, NITRITE, OR GLUCOSE <1000 mg/dL.   Sepsis Labs Invalid input(s): PROCALCITONIN,  WBC,  LACTICIDVEN Microbiology Recent Results (from the past 240 hour(s))  Novel Coronavirus, NAA (Hosp order, Send-out to Ref Lab; TAT 18-24 hrs     Status: None   Collection Time: 11/28/18 11:35 PM   Specimen: Nasopharyngeal Swab; Respiratory  Result Value Ref Range Status   SARS-CoV-2, NAA NOT DETECTED NOT DETECTED Final    Comment: (NOTE) This nucleic acid amplification test was developed and its performance characteristics determined by [facility]. Nucleic acid amplification tests include PCR and TMA. This test has not been FDA cleared or approved. This test has been authorized by FDA under an Emergency Use Authorization (EUA). This test is only authorized for the duration of time the declaration that circumstances exist justifying the authorization of the emergency use of in vitro diagnostic tests for detection of SARS-CoV-2 virus and/or diagnosis of COVID-19 infection under section 564(b)(1) of the Act, 21 U.S.C. 161WRU-0(A360bbb-3(b) (1), unless the authorization is  terminated or revoked sooner. When diagnostic testing is negative, the possibility of a false negative result should be considered in the context of a patient's recent exposures and the presence of clinical signs and symptoms consistent with COVID-19. An individual without symptoms of COVID- 19 and who is not shedding SARS-CoV-2 vi rus would expect to have a negative (not detected) result in this assay. Performed At: [facility] [address] [place], [place] [id number] Jolene SchimkeNagendra Sanjai MD YN:[phone]    Coronavirus Source NASOPHARYNGEAL  Final    Comment: Performed at [facility], [address] [place], [place] [address]     Time coordinating discharge in minutes: 65  SIGNED:   Calvert CantorSaima Darriona Dehaas, MD  [facility] 12/02/2018, 3:29 PM Pager   If 7PM-7AM, please contact night-coverage [email or web address] Password [email or web address]

## 2018-11-29 NOTE — ED Notes (Signed)
Pt has been NSR for the most part of her stay. Patient has had bradycardia (35-47) a couple of times.

## 2018-11-29 NOTE — H&P (Signed)
History and Physical    LEELOO SILVERTHORNE UDJ:[id number] DOB: [birth year] DOA: 11/28/2018  PCP: Leanna Battles, MD  Patient coming from: Home.  Chief Complaint: Chest pain.  HPI: [name] is a 85 y.o. female with history of CAD status post CABG, hypertension, hyperlipidemia, hypothyroidism was brought to the ER after patient started experiencing dizziness palpitation and chest tightness with shortness of breath last night at home.  Patient states around 7 PM last night while she was walking around at home patient started having sudden onset of dizziness palpitation chest tightness and shortness of breath lasted until patient was brought into the hospital by the EMS.  Denies any abdominal pain no vomiting had some nausea.  Symptoms resolved without intervention.  ED Course: In the ER chest x-ray was unremarkable EKG was showing normal sinus rhythm high-sensitivity troponin was 14 and 30.  Given the cardiac history patient admitted for further management.  At the time of my exam patient is chest pain-free.  Other labs show creatinine of 0.9 LFTs were normal.  Platelets was 147.  Review of Systems: As per HPI, rest all negative.   Past Medical History:  Diagnosis Date  . Anemia   . Coronary artery disease   . Diabetes mellitus   . DJD (degenerative joint disease)   . Dysrhythmia   . Edema   . GERD (gastroesophageal reflux disease)   . Hiatal hernia   . History of blood transfusion   . Hyperlipidemia   . Hypertension   . Hypothyroidism   . IBS (irritable bowel syndrome)   . SVT (supraventricular tachycardia) ([place])    with ablation    Past Surgical History:  Procedure Laterality Date  . APPENDECTOMY    . BREAST SURGERY     biopsy per left breast   . CARDIAC CATHETERIZATION    . CHOLECYSTECTOMY  2005  . CORONARY ARTERY BYPASS GRAFT  2000  . EYE SURGERY     cataract surgery bilat   . REPLACEMENT TOTAL KNEE  2009   left     reports that she has never smoked. She has  never used smokeless tobacco. She reports that she does not drink alcohol or use drugs.  Allergies  Allergen Reactions  . Actos [Pioglitazone Hydrochloride] Swelling    Edema.  . Codeine     Visual hallucinations.     Family History  Problem Relation Age of Onset  . Heart attack Mother 64       died  . Heart attack Father 68       died  . Breast cancer Sister   . Diabetes Sister        and brother  . Heart disease Other        sister, father and brother  . Coronary artery disease Brother        PCI    Prior to Admission medications   Medication Sig Start Date End Date Taking? Authorizing Provider  acetaminophen (TYLENOL) 500 MG tablet Take 1,000 mg by mouth 2 (two) times daily as needed for mild pain or moderate pain.    Yes [provider]  amLODipine (NORVASC) 10 MG tablet TAKE 1 TABLET BY MOUTH EVERY DAY Patient taking differently: Take 10 mg by mouth daily.  04/16/18  Yes Fay Records, MD  aspirin 81 MG tablet Take 81 mg by mouth daily.     Yes [provider]  atorvastatin (LIPITOR) 80 MG tablet TAKE 1 TABLET BY MOUTH EVERY DAY IN  THE EVENING Patient taking differently: Take 80 mg by mouth every evening.  11/20/18  Yes Pricilla Riffle, MD  calcium carbonate (TUMS - DOSED IN MG ELEMENTAL CALCIUM) 500 MG chewable tablet Chew 1 tablet by mouth as needed for indigestion or heartburn.    Yes [provider]  ergocalciferol (VITAMIN D2) 50000 UNITS capsule Take 50,000 Units by mouth once a week.     Yes [provider]  esomeprazole (NEXIUM) 20 MG capsule Take 20 mg by mouth daily before breakfast.   Yes [provider]  ezetimibe (ZETIA) 10 MG tablet Take 0.5 tablets (5 mg total) by mouth daily. 10/08/18  Yes Pricilla Riffle, MD  furosemide (LASIX) 20 MG tablet Take 1 tablet (20 mg total) by mouth daily. 05/21/18  Yes Pricilla Riffle, MD  KLOR-CON M20 20 MEQ tablet TAKE 1 TABLET BY MOUTH EVERY DAY Patient taking differently: Take 20 mEq by  mouth daily.  05/06/18  Yes Pricilla Riffle, MD  levothyroxine (SYNTHROID, LEVOTHROID) 75 MCG tablet Take 75 mcg by mouth daily.     Yes [provider]  lisinopril (PRINIVIL,ZESTRIL) 5 MG tablet TAKE 1 TABLET BY MOUTH EVERY DAY Patient taking differently: Take 5 mg by mouth daily.  12/19/11  Yes Wendall Stade, MD  metoprolol tartrate (LOPRESSOR) 25 MG tablet Take 1 tablet (25 mg total) by mouth 2 (two) times daily. 07/29/18  Yes Pricilla Riffle, MD  nitroGLYCERIN (NITROSTAT) 0.4 MG SL tablet Place 1 tablet (0.4 mg total) under the tongue every 5 (five) minutes x 3 doses as needed for chest pain. 12/07/15  Yes Strader, Lennart Pall, PA-C    Physical Exam: Constitutional: Moderately built and nourished. Vitals:   11/29/18 0100 11/29/18 0300 11/29/18 0330 11/29/18 0400  BP: (!) 154/56 (!) 153/64 (!) 142/61 (!) 146/67  Pulse: 64 (!) 59 60 68  Resp: 18 18 16 16   Temp:      TempSrc:      SpO2: 97% 99% 97% 98%  Weight:      Height:       Eyes: Anicteric no pallor. ENMT: No discharge from the ears or nose or mouth. Neck: No mass felt.  No neck rigidity. Respiratory: No rhonchi or crepitations. Cardiovascular: S1-S2 heard. Abdomen: Soft nontender bowel sounds present. Musculoskeletal: No edema. Skin: No rash. Neurologic: Alert awake oriented to time place and person.  Moves all extremities. Psychiatric: Appears normal per normal affect.   Labs on Admission: I have personally reviewed following labs and imaging studies  CBC: Recent Labs  Lab 11/28/18 2139  WBC 7.5  NEUTROABS 4.4  HGB 13.0  HCT 35.6*  MCV 91.5  PLT 147*   Basic Metabolic Panel: Recent Labs  Lab 11/28/18 2139  NA 131*  K 4.2  CL 100  CO2 24  GLUCOSE 150*  BUN 9  CREATININE 0.90  CALCIUM 9.0   GFR: Estimated Creatinine Clearance: 40.1 mL/min (by C-G formula based on SCr of 0.9 mg/dL). Liver Function Tests: Recent Labs  Lab 11/28/18 2139  AST 19  ALT 12  ALKPHOS 39  BILITOT 1.2  PROT 6.2*   ALBUMIN 3.9   No results for input(s): LIPASE, AMYLASE in the last 168 hours. No results for input(s): AMMONIA in the last 168 hours. Coagulation Profile: No results for input(s): INR, PROTIME in the last 168 hours. Cardiac Enzymes: No results for input(s): CKTOTAL, CKMB, CKMBINDEX, TROPONINI in the last 168 hours. BNP (last 3 results) Recent Labs    03/29/18  0911  PROBNP 412   HbA1C: No results for input(s): HGBA1C in the last 72 hours. CBG: No results for input(s): GLUCAP in the last 168 hours. Lipid Profile: No results for input(s): CHOL, HDL, LDLCALC, TRIG, CHOLHDL, LDLDIRECT in the last 72 hours. Thyroid Function Tests: Recent Labs    11/28/18 2335  TSH 0.869   Anemia Panel: No results for input(s): VITAMINB12, FOLATE, FERRITIN, TIBC, IRON, RETICCTPCT in the last 72 hours. Urine analysis:    Component Value Date/Time   COLORURINE YELLOW 11/18/2008 1608   APPEARANCEUR CLEAR 11/18/2008 1608   LABSPEC 1.006 11/18/2008 1608   PHURINE 7.0 11/18/2008 1608   GLUCOSEU NEGATIVE 11/18/2008 1608   HGBUR NEGATIVE 11/18/2008 1608   BILIRUBINUR NEGATIVE 11/18/2008 1608   KETONESUR NEGATIVE 11/18/2008 1608   PROTEINUR NEGATIVE 11/18/2008 1608   UROBILINOGEN 0.2 11/18/2008 1608   NITRITE NEGATIVE 11/18/2008 1608   LEUKOCYTESUR  11/18/2008 1608    NEGATIVE MICROSCOPIC NOT DONE ON URINES WITH NEGATIVE PROTEIN, BLOOD, LEUKOCYTES, NITRITE, OR GLUCOSE <1000 mg/dL.   Sepsis Labs: @LABRCNTIP (procalcitonin:4,lacticidven:4) )No results found for this or any previous visit (from the past 240 hour(s)).   Radiological Exams on Admission: Dg Chest 2 View  Result Date: 11/28/2018 CLINICAL DATA:  Chest tightness. EXAM: CHEST - 2 VIEW COMPARISON:  12/06/2015 FINDINGS: Post median sternotomy and CABG. Stable upper normal heart size with unchanged mediastinal contours. Atherosclerosis of the thoracic aorta. No pulmonary edema, focal airspace disease, pleural effusion or pneumothorax. No  acute osseous abnormalities are seen. IMPRESSION: 1. No acute abnormality or change from 2017. 2. Post CABG with aortic atherosclerosis (ICD10-I70.0). Electronically Signed   By: Narda RutherfordMelanie  Sanford M.D.   On: 11/28/2018 21:24    EKG: Independently reviewed.  Normal sinus rhythm.  Assessment/Plan Principal Problem:   Chest pain Active Problems:   Hyperlipidemia, mixed   HYPERTENSION, BENIGN   S/P CABG x 3    1. Chest pain with history of CAD status post CABG -will admit to rule out ACS.  Patient is on antiplatelet agents statins beta-blockers.  Consult cardiology.  We will keep patient n.p.o. in a.m. anticipation of procedure. 2. Hypertension on lisinopril amlodipine metoprolol. 3. Diastolic dysfunction on Lasix. 4. Hypothyroidism on Synthroid. 5. Thrombocytopenia appears to be chronic follow CBC.   DVT prophylaxis: Lovenox. Code Status: Full code. Family Communication: Discussed with patient. Disposition Plan: Home. Consults called: Cardiology. Admission status: Observation.   Eduard ClosArshad N Jannette Cotham MD [facility] Pager [phone].  If 7PM-7AM, please contact night-coverage [email or web address] Password [email or web address]  11/29/2018, 5:02 AM

## 2018-11-29 NOTE — Progress Notes (Signed)
Dr. Burt Knack discussed pt with [name] who plans to discharge pt. He recommends OP event monitor and f/u with Dr. Harrington Challenger. Will send message to office to arrange. Dayna Dunn PA-C

## 2018-11-29 NOTE — Progress Notes (Signed)
   [name] presented for a nuclear stress test today.  No immediate complications.  Stress imaging is pending at this time.  Preliminary EKG findings may be listed in the chart, but the stress test result will not be finalized until perfusion imaging is complete.  1 day study, [facility] to read.  Kathyrn Drown, PA-C 11/29/2018, 2:44 PM

## 2018-11-29 NOTE — Consult Note (Signed)
Cardiology Consultation:   Patient ID: [name]; [id number]; June 08, 1932   Admit date: 11/28/2018 Date of Consult: 11/29/2018  Primary Care Provider: Jarome Matin, MD Primary Cardiologist: Dietrich Pates, MD Primary Electrophysiologist:  None    Patient Profile:   [name] is a 85 y.o. female with a PMH of CAD s/p CABG in 2000, HTN, HLD, SVT s/p ablation, panic attacks, and hypothyroidism, who is being seen today for the evaluation of chest pain at the request of Dr. Toniann Fail.  History of Present Illness:   [name] was in her usual state of health until yesterday evening when she noticed sudden onset dizziness. She reported associated palpitations, SOB, and mild chest tightness. She states she has a history of panic attacks but she was not feeling particularly stressed at that time. Earlier that day she went for her usual 30 minute walk in the morning and did experience some chest tightness when walking up a hill which was out of the ordinary but this resolved once she returned to level ground and she was able to complete her walk. She reported she had never experienced the dizziness/palpitations/SOB/CP sensation before so she activated EMS who brought her to the ED. Symptoms resolved spontaneously prior to arrival.   She was last evaluated by cardiology at an outpatient visit with Dr. Tenny Craw 07/29/2018, at which time she reported some SOB with activity and mild LE edema. She was recommended to continue taking lasix daily with additional lasix 1-2 times per week for increased swelling and follow-up in 6 months. Her last echocardiogram was 03/2018 which showed EF 55-60%, G1DD, mildly dilated LA, mild MR/TR, and mild pulmHTN. Her last ischemic evaluation was a NST in 2013 which showed no evidence of ischemia.   At the time of this evaluation she reports she feels back to her usual self. No recurrence of symptoms since arrival to the hospital. She takes lasix daily and reports taking  an extra lasix on Monday for increased LE edema which subsequently resolved. She denies orthopnea, PND, lightheadedness, or syncope. No complaints of fever, cough, N/V/D, abdominal pain, or urinary complaints.   Hospital course: Hypertensive with SBP above 140, bradycardic to the 50s, otherwise VSS. Labs notable for Na 131, K 4.2, Cr 0.9, Hgb 13.0, PLT 147, Trop 14>30, TSH wnl, COVID 19 pending. CXR without acute findings. EKG with sinus rhythm with 1st degree AV block and RBBB (chronic), with TWI in anterior leads which are more pronounced than previous; no STE/D. She was given ASA 243mg  en route to the ED. She was admitted to medicine for ACS r/o. Cariology asked to evaluate.   Past Medical History:  Diagnosis Date  . Anemia   . Coronary artery disease   . Diabetes mellitus   . DJD (degenerative joint disease)   . Dysrhythmia   . Edema   . GERD (gastroesophageal reflux disease)   . Hiatal hernia   . History of blood transfusion   . Hyperlipidemia   . Hypertension   . Hypothyroidism   . IBS (irritable bowel syndrome)   . SVT (supraventricular tachycardia) ([facility])    with ablation    Past Surgical History:  Procedure Laterality Date  . APPENDECTOMY    . BREAST SURGERY     biopsy per left breast   . CARDIAC CATHETERIZATION    . CHOLECYSTECTOMY  2005  . CORONARY ARTERY BYPASS GRAFT  2000  . EYE SURGERY     cataract surgery bilat   . REPLACEMENT TOTAL KNEE  2009   left     Home Medications:  Prior to Admission medications   Medication Sig Start Date End Date Taking? Authorizing Provider  acetaminophen (TYLENOL) 500 MG tablet Take 1,000 mg by mouth 2 (two) times daily as needed for mild pain or moderate pain.    Yes [provider]  amLODipine (NORVASC) 10 MG tablet TAKE 1 TABLET BY MOUTH EVERY DAY Patient taking differently: Take 10 mg by mouth daily.  04/16/18  Yes Pricilla Riffle, MD  aspirin 81 MG tablet Take 81 mg by mouth daily.     Yes [provider]   atorvastatin (LIPITOR) 80 MG tablet TAKE 1 TABLET BY MOUTH EVERY DAY IN THE EVENING Patient taking differently: Take 80 mg by mouth every evening.  11/20/18  Yes Pricilla Riffle, MD  calcium carbonate (TUMS - DOSED IN MG ELEMENTAL CALCIUM) 500 MG chewable tablet Chew 1 tablet by mouth as needed for indigestion or heartburn.    Yes [provider]  ergocalciferol (VITAMIN D2) 50000 UNITS capsule Take 50,000 Units by mouth once a week.     Yes [provider]  esomeprazole (NEXIUM) 20 MG capsule Take 20 mg by mouth daily before breakfast.   Yes [provider]  ezetimibe (ZETIA) 10 MG tablet Take 0.5 tablets (5 mg total) by mouth daily. 10/08/18  Yes Pricilla Riffle, MD  furosemide (LASIX) 20 MG tablet Take 1 tablet (20 mg total) by mouth daily. 05/21/18  Yes Pricilla Riffle, MD  KLOR-CON M20 20 MEQ tablet TAKE 1 TABLET BY MOUTH EVERY DAY Patient taking differently: Take 20 mEq by mouth daily.  05/06/18  Yes Pricilla Riffle, MD  levothyroxine (SYNTHROID, LEVOTHROID) 75 MCG tablet Take 75 mcg by mouth daily.     Yes [provider]  lisinopril (PRINIVIL,ZESTRIL) 5 MG tablet TAKE 1 TABLET BY MOUTH EVERY DAY Patient taking differently: Take 5 mg by mouth daily.  12/19/11  Yes Wendall Stade, MD  metoprolol tartrate (LOPRESSOR) 25 MG tablet Take 1 tablet (25 mg total) by mouth 2 (two) times daily. 07/29/18  Yes Pricilla Riffle, MD  nitroGLYCERIN (NITROSTAT) 0.4 MG SL tablet Place 1 tablet (0.4 mg total) under the tongue every 5 (five) minutes x 3 doses as needed for chest pain. 12/07/15  Yes Strader, Lennart Pall, PA-C    Inpatient Medications: Scheduled Meds: . amLODipine  10 mg Oral Daily  . aspirin EC  81 mg Oral Daily  . atorvastatin  80 mg Oral QPM  . enoxaparin (LOVENOX) injection  40 mg Subcutaneous Q24H  . ezetimibe  5 mg Oral Daily  . levothyroxine  75 mcg Oral Daily  . lisinopril  5 mg Oral Daily  . metoprolol tartrate  25 mg Oral BID   Continuous Infusions:  PRN  Meds: acetaminophen, calcium carbonate, nitroGLYCERIN, ondansetron (ZOFRAN) IV  Allergies:    Allergies  Allergen Reactions  . Actos [Pioglitazone Hydrochloride] Swelling    Edema.  . Codeine     Visual hallucinations.     Social History:   Social History   Socioeconomic History  . Marital status: Single    Spouse name: Not on file  . Number of children: Not on file  . Years of education: Not on file  . Highest education level: Not on file  Occupational History  . Occupation: retired    Associate Professor: RETIRED  Social Needs  . Financial resource strain: Not on file  . Food insecurity    Worry:  Not on file    Inability: Not on file  . Transportation needs    Medical: Not on file    Non-medical: Not on file  Tobacco Use  . Smoking status: Never Smoker  . Smokeless tobacco: Never Used  Substance and Sexual Activity  . Alcohol use: No  . Drug use: No  . Sexual activity: Not on file  Lifestyle  . Physical activity    Days per week: Not on file    Minutes per session: Not on file  . Stress: Not on file  Relationships  . Social Musician on phone: Not on file    Gets together: Not on file    Attends religious service: Not on file    Active member of club or organization: Not on file    Attends meetings of clubs or organizations: Not on file    Relationship status: Not on file  . Intimate partner violence    Fear of current or ex partner: Not on file    Emotionally abused: Not on file    Physically abused: Not on file    Forced sexual activity: Not on file  Other Topics Concern  . Not on file  Social History Narrative  . Not on file    Family History:    Family History  Problem Relation Age of Onset  . Heart attack Mother 69       died  . Heart attack Father 53       died  . Breast cancer Sister   . Diabetes Sister        and brother  . Heart disease Other        sister, father and brother  . Coronary artery disease Brother        PCI      ROS:  Please see the history of present illness.   All other ROS reviewed and negative.     Physical Exam/Data:   Vitals:   11/29/18 0400 11/29/18 0500 11/29/18 0601 11/29/18 0602  BP: (!) 146/67 (!) 140/57  (!) 146/67  Pulse: 68 61  66  Resp: Temp:    97.7 F (36.5 C)  TempSrc:    Oral  SpO2: 98% 96%  100%  Weight:   64.4 kg   Height:   5' 3.5" (1.613 m)     Intake/Output Summary (Last 24 hours) at 11/29/2018 0755 Last data filed at 11/29/2018 0600 Gross per 24 hour  Intake 50 ml  Output -  Net 50 ml   Filed Weights   11/28/18 2053 11/29/18 0601  Weight: 64 kg 64.4 kg   Body mass index is 24.76 kg/m.   Physical exam per MD below  EKG:  The EKG was personally reviewed and demonstrates:  sinus rhythm with 1st degree AV block and RBBB (chronic), with TWI in anterior leads which are more pronounced than previous; no STE/D.  Relevant CV Studies: Echocardiogram 03/2018: 1. The left ventricle has normal systolic function of 55-60%. The cavity size was normal. There is mildly increased left ventricular wall thickness. Echo evidence of impaired diastolic relaxation.  2. The right ventricle has normal systolic function. The cavity was normal. There is no increase in right ventricular wall thickness. Right ventricular systolic pressure is mildly elevated.  3. Left atrial size was mildly dilated.  4. The mitral valve is normal in structure.  5. The tricuspid valve is normal in structure.  6. The aortic  valve is tricuspid There is mild thickening of the aortic valve.  7. The pulmonic valve was normal in structure.  8. Normal LV systolic function; mild LVH; mild diastolic dysfunction; mild LAE; mild MR; mild TR; mild pulmonary hypertension.  Laboratory Data:  Chemistry Recent Labs  Lab 11/28/18 2139  NA 131*  K 4.2  CL 100  CO2 24  GLUCOSE 150*  BUN 9  CREATININE 0.90  CALCIUM 9.0  GFRNONAA 59*  GFRAA >60  ANIONGAP 7    Recent Labs  Lab  11/28/18 2139  PROT 6.2*  ALBUMIN 3.9  AST 19  ALT 12  ALKPHOS 39  BILITOT 1.2   Hematology Recent Labs  Lab 11/28/18 2139  WBC 7.5  RBC 3.89  HGB 13.0  HCT 35.6*  MCV 91.5  MCH 33.4  MCHC 36.5*  RDW 12.7  PLT 147*   Cardiac EnzymesNo results for input(s): TROPONINI in the last 168 hours. No results for input(s): TROPIPOC in the last 168 hours.  BNPNo results for input(s): BNP, PROBNP in the last 168 hours.  DDimer No results for input(s): DDIMER in the last 168 hours.  Radiology/Studies:  Dg Chest 2 View  Result Date: 11/28/2018 CLINICAL DATA:  Chest tightness. EXAM: CHEST - 2 VIEW COMPARISON:  12/06/2015 FINDINGS: Post median sternotomy and CABG. Stable upper normal heart size with unchanged mediastinal contours. Atherosclerosis of the thoracic aorta. No pulmonary edema, focal airspace disease, pleural effusion or pneumothorax. No acute osseous abnormalities are seen. IMPRESSION: 1. No acute abnormality or change from 2017. 2. Post CABG with aortic atherosclerosis (ICD10-I70.0). Electronically Signed   By: Keith Rake M.D.   On: 11/28/2018 21:24    Assessment and Plan:   1. Chest tightness in patient with CAD s/p CABG in 2000: patient presented with chest tightness yesterday morning while walking up a hill which resolved after returning to level ground. Also with additional non-exertional episode in the evening which was precipitated by dizziness, palpitations, SOB. Pain resolved spontaneously prior to arrival EKG with increased TWI in anterior leads. Troponins 14>30. Last ischemic evaluation was a NST in 2013 which was without ischemia. Echo 03/2018 with EF 55-60% and G1DD.  - Favor checking a NST to further evaluate for ischemia - Continue aspirin, statin, and zetia - Continue metoprolol  2. Dizziness with associated palpitations: EKG with sinus rhythm this admission. Possible she had some transient arrhythmia. She has a history of SVT s/p ablation.  - Could consider  a cardiac monitor at discharge if telemetry is unrevealing - Continue metoprolol 25mg  BID  3. HTN: BP persistently above goal - Continue amlodipine, lisinopril, metoprolol, and lasix for now - if remains elevated, could uptitrate lisinopril to 10mg  daily  4. HLD: LDL 62 02/2018; at goal of <70 - Continue statin and zetia   For questions or updates, please contact [facility] Please consult [email or web address] for contact info under Cardiology/STEMI.   Signed, Abigail Butts, PA-C  11/29/2018 7:55 AM [phone]  Patient seen, examined. Available data reviewed. Agree with findings, assessment, and plan as outlined by Roby Lofts, PA-C.  The patient is independently interviewed and examined.  She is an alert, oriented, elderly woman in no distress. Vitals:   11/29/18 0602 11/29/18 0921  BP: (!) 146/67 (!) 149/61  Pulse: 66 64  Resp: 16 18  Temp: 97.7 F (36.5 C) 98.2 F (36.8 C)  SpO2: 100% 98%   HEENT: normal Neck: JVP - normal, carotids 2+= without bruits Lungs: CTA bilaterally CV: RRR without murmur  or gallop Abd: soft, NT, Positive BS, no hepatomegaly Ext: no C/C/E, distal pulses intact and equal Skin: warm/dry no rash Neurologic: Grossly intact with equal strength in the arms and legs bilaterally Back: Midline  EKG is reviewed and does demonstrate ST/T abnormality consider anterior ischemia, more pronounced than noted on previous EKGs.  Her history sounds suspicious for SVT as she had relatively sudden onset of weakness, dizziness, and rapid heart palpitations.  This was associated with chest tightness and tightness into the face and jaw.  Symptoms then resolved on their own.  She has been physically active without exertional chest pain prior to the event last night.  Her high-sensitivity troponin is minimally elevated.  She has been chest pain-free throughout her hospitalization.  Considering her age and known CAD with remote coronary bypass surgery, I have recommended  proceeding with a Lexiscan Myoview stress test for risk stratification purposes.  If her stress test is low risk, I would be inclined to treat her medically.  I have also recommended an outpatient event monitor which we will arrange at discharge.  Will provide follow-up once her stress test is completed.  Tonny BollmanMichael Anselma Herbel, M.D. 11/29/2018 12:17 PM

## 2018-11-29 NOTE — ED Notes (Signed)
ED TO INPATIENT HANDOFF REPORT  ED Nurse Name and Phone #:  [phone]  S Name/Age/Gender [name] 85 y.o. female Room/Bed: 015C/015C  Code Status   Code Status: Full Code  Home/SNF/Other Home Patient oriented to: self, place, time and situation Is this baseline? Yes   Triage Complete: Triage complete  Chief Complaint Chest Tightness  Triage Note Pt BIB [facility] for chest tightness that has been off and on a few times today while moving around.  Pt given 243 mg ASA by EMS Pt took 81 mg ASA this morning. Hx of panic attacks.    Allergies Allergies  Allergen Reactions  . Actos [Pioglitazone Hydrochloride] Swelling    Edema.  . Codeine     Visual hallucinations.     Level of Care/Admitting Diagnosis ED Disposition    ED Disposition Condition Comment   Admit  Hospital Area: [place] [[id number]]  Level of Care: Telemetry Cardiac [103]  I expect the patient will be discharged within 24 hours: No (not a candidate for 5C-Observation unit)  Covid Evaluation: Asymptomatic Screening Protocol (No Symptoms)  Diagnosis: Chest pain [454098]  Admitting Physician: Rise Patience [id number]  Attending Physician: Rise Patience [[id number]  PT Class (Do Not Modify): Observation [104]  PT Acc Code (Do Not Modify): Observation [10022]       B Medical/Surgery History Past Medical History:  Diagnosis Date  . Anemia   . Coronary artery disease   . Diabetes mellitus   . DJD (degenerative joint disease)   . Dysrhythmia   . Edema   . GERD (gastroesophageal reflux disease)   . Hiatal hernia   . History of blood transfusion   . Hyperlipidemia   . Hypertension   . Hypothyroidism   . IBS (irritable bowel syndrome)   . SVT (supraventricular tachycardia) ([place])    with ablation   Past Surgical History:  Procedure Laterality Date  . APPENDECTOMY    . BREAST SURGERY     biopsy per left breast   . CARDIAC CATHETERIZATION    . CHOLECYSTECTOMY  2005  .  CORONARY ARTERY BYPASS GRAFT  2000  . EYE SURGERY     cataract surgery bilat   . REPLACEMENT TOTAL KNEE  2009   left     A IV Location/Drains/Wounds Patient Lines/Drains/Airways Status   Active Line/Drains/Airways    Name:   Placement date:   Placement time:   Site:   Days:   Peripheral IV 11/28/18 Left Antecubital   11/28/18    2046    Antecubital   1          Intake/Output Last 24 hours No intake or output data in the 24 hours ending 11/29/18 0532  Labs/Imaging Results for orders placed or performed during the hospital encounter of 11/28/18 (from the past 48 hour(s))  CBC with Differential     Status: Abnormal   Collection Time: 11/28/18  9:39 PM  Result Value Ref Range   WBC 7.5 4.0 - 10.5 K/uL   RBC 3.89 3.87 - 5.11 MIL/uL   Hemoglobin 13.0 12.0 - 15.0 g/dL   HCT 35.6 (L) 36.0 - 46.0 %   MCV 91.5 80.0 - 100.0 fL   MCH 33.4 26.0 - 34.0 pg   MCHC 36.5 (H) 30.0 - 36.0 g/dL   RDW 12.7 11.5 - 15.5 %   Platelets 147 (L) 150 - 400 K/uL   nRBC 0.0 0.0 - 0.2 %   Neutrophils Relative % 58 %  Neutro Abs 4.4 1.7 - 7.7 K/uL   Lymphocytes Relative 24 %   Lymphs Abs 1.8 0.7 - 4.0 K/uL   Monocytes Relative 13 %   Monocytes Absolute 1.0 0.1 - 1.0 K/uL   Eosinophils Relative 3 %   Eosinophils Absolute 0.2 0.0 - 0.5 K/uL   Basophils Relative 1 %   Basophils Absolute 0.1 0.0 - 0.1 K/uL   Immature Granulocytes 1 %   Abs Immature Granulocytes 0.07 0.00 - 0.07 K/uL    Comment: Performed at [facility], [address], [place], [place] [address]  Comprehensive metabolic panel     Status: Abnormal   Collection Time: 11/28/18  9:39 PM  Result Value Ref Range   Sodium 131 (L) 135 - 145 mmol/L   Potassium 4.2 3.5 - 5.1 mmol/L   Chloride 100 98 - 111 mmol/L   CO2 24 22 - 32 mmol/L   Glucose, Bld 150 (H) 70 - 99 mg/dL   BUN 9 8 - 23 mg/dL   Creatinine, Ser 6.04 0.44 - 1.00 mg/dL   Calcium 9.0 8.9 - 54.0 mg/dL   Total Protein 6.2 (L) 6.5 - 8.1 g/dL   Albumin 3.9 3.5 - 5.0  g/dL   AST 19 15 - 41 U/L   ALT 12 0 - 44 U/L   Alkaline Phosphatase 39 38 - 126 U/L   Total Bilirubin 1.2 0.3 - 1.2 mg/dL   GFR calc non Af Amer 59 (L) >60 mL/min   GFR calc Af Amer >60 >60 mL/min   Anion gap 7 5 - 15    Comment: Performed at [facility], [address], [place], [place] [address]  Troponin I (High Sensitivity)     Status: None   Collection Time: 11/28/18  9:39 PM  Result Value Ref Range   Troponin I (High Sensitivity) 14 <18 ng/L    Comment: (NOTE) Elevated high sensitivity troponin I (hsTnI) values and significant  changes across serial measurements may suggest ACS but many other  chronic and acute conditions are known to elevate hsTnI results.  Refer to the "Links" section for chest pain algorithms and additional  guidance. Performed at [facility], [address] [place], [place] [address]   Troponin I (High Sensitivity)     Status: Abnormal   Collection Time: 11/28/18 11:35 PM  Result Value Ref Range   Troponin I (High Sensitivity) 30 (H) <18 ng/L    Comment: (NOTE) Elevated high sensitivity troponin I (hsTnI) values and significant  changes across serial measurements may suggest ACS but many other  chronic and acute conditions are known to elevate hsTnI results.  Refer to the "Links" section for chest pain algorithms and additional  guidance. Performed at [facility], [address] [place], [place] [address]   TSH     Status: None   Collection Time: 11/28/18 11:35 PM  Result Value Ref Range   TSH 0.869 0.350 - 4.500 uIU/mL    Comment: Performed by a 3rd Generation assay with a functional sensitivity of <=0.01 uIU/mL. Performed at [facility], [address] [place], [place] [address]    Dg Chest 2 View  Result Date: 11/28/2018 CLINICAL DATA:  Chest tightness. EXAM: CHEST - 2 VIEW COMPARISON:  12/06/2015 FINDINGS: Post median sternotomy and CABG. Stable upper normal heart size with unchanged mediastinal contours.  Atherosclerosis of the thoracic aorta. No pulmonary edema, focal airspace disease, pleural effusion or pneumothorax. No acute osseous abnormalities  are seen. IMPRESSION: 1. No acute abnormality or change from 2017. 2. Post CABG with aortic atherosclerosis (ICD10-I70.0). Electronically Signed   By: Narda RutherfordMelanie  Sanford M.D.   On: 11/28/2018 21:24    Pending Labs Unresulted Labs (From admission, onward)    Start     Ordered   12/06/18 0500  Creatinine, serum  (enoxaparin (LOVENOX)    CrCl >/= 30 ml/min)  Weekly,   R    Comments: while on enoxaparin therapy    11/29/18 0501   11/29/18 0456  CBC  (enoxaparin (LOVENOX)    CrCl >/= 30 ml/min)  Once,   STAT    Comments: Baseline for enoxaparin therapy IF NOT ALREADY DRAWN.  Notify MD if PLT < 100 K.    11/29/18 0501   11/29/18 0456  Creatinine, serum  (enoxaparin (LOVENOX)    CrCl >/= 30 ml/min)  Once,   STAT    Comments: Baseline for enoxaparin therapy IF NOT ALREADY DRAWN.    11/29/18 0501   11/28/18 2302  Novel Coronavirus, NAA (Hosp order, Send-out to Ref Lab; TAT 18-24 hrs  (Asymptomatic/Tier 3)  Once,   STAT    Question Answer Comment  Is this test for diagnosis or screening Screening   Symptomatic for COVID-19 as defined by CDC No   Hospitalized for COVID-19 No   Admitted to ICU for COVID-19 No   Previously tested for COVID-19 No   Resident in a congregate (group) care setting No   Employed in healthcare setting No   Pregnant No      11/28/18 2301          Vitals/Pain Today's Vitals   11/29/18 0300 11/29/18 0330 11/29/18 0400 11/29/18 0500  BP: (!) 153/64 (!) 142/61 (!) 146/67 (!) 140/57  Pulse: (!) 59 60 68 61  Resp: 18 16 16 14   Temp:      TempSrc:      SpO2: 99% 97% 98% 96%  Weight:      Height:      PainSc:        Isolation Precautions No active isolations  Medications Medications  aspirin tablet 81 mg (has no administration in time range)  amLODipine (NORVASC) tablet 10 mg (has no administration in time range)   atorvastatin (LIPITOR) tablet 80 mg (has no administration in time range)  ezetimibe (ZETIA) tablet 5 mg (has no administration in time range)  furosemide (LASIX) tablet 20 mg (has no administration in time range)  lisinopril (ZESTRIL) tablet 5 mg (has no administration in time range)  metoprolol tartrate (LOPRESSOR) tablet 25 mg (has no administration in time range)  nitroGLYCERIN (NITROSTAT) SL tablet 0.4 mg (has no administration in time range)  levothyroxine (SYNTHROID) tablet 75 mcg (has no administration in time range)  calcium carbonate (TUMS - dosed in mg elemental calcium) chewable tablet 200 mg of elemental calcium (has no administration in time range)  potassium chloride SA (KLOR-CON) CR tablet 20 mEq (has no administration in time range)  acetaminophen (TYLENOL) tablet 650 mg (has no administration in time range)  ondansetron (ZOFRAN) injection 4 mg (has no administration in time range)  enoxaparin (LOVENOX) injection 40 mg (has no administration in time range)  aspirin EC tablet 81 mg (has no administration in time range)    Mobility walks Low fall risk   Focused Assessments Cardiac Assessment Handoff:  Cardiac Rhythm: Normal sinus rhythm Lab Results  Component Value Date   CKTOTAL 5784 HEMOLYZED SPECIMEN, RESULTS MAY BE AFFECTED 11/19/2008   CKMB 1.5  HEMOLYZED SPECIMEN, RESULTS MAY BE AFFECTED 11/19/2008   TROPONINI <0.03 12/06/2015   Lab Results  Component Value Date   DDIMER (H) 03/31/2007    5.95        AT THE INHOUSE ESTABLISHED CUTOFF VALUE OF 0.48 ug/mL FEU, THIS ASSAY HAS BEEN DOCUMENTED IN THE LITERATURE TO HAVE   Does the Patient currently have chest pain? No     R Recommendations: See Admitting Provider Note  Report given to:   Additional Notes:

## 2018-11-30 LAB — NOVEL CORONAVIRUS, NAA (HOSP ORDER, SEND-OUT TO REF LAB; TAT 18-24 HRS): SARS-CoV-2, NAA: NOT DETECTED

## 2018-12-02 ENCOUNTER — Telehealth: Payer: Self-pay | Admitting: Internal Medicine

## 2018-12-02 NOTE — Telephone Encounter (Signed)
Has a knot on back of knee.  It was hurting.  It is dark purple in color.  Dime sized.  She thinks she hit against the bed at the hospital.  Has normal range of motion.   She will keep an eye on this.  She will call us back if there are any changes.   I recommended she call and speak with PCP re: feeling like she is having panic spells.  She gets worked up  (watching the news, calling [employer]) and starts to breath fast.  She gets flushed too.  She does not check her BP at home because she gets nervous when she takes it.

## 2018-12-02 NOTE — Telephone Encounter (Signed)
New Message   I called patient to schedule f/u appt from hospital discharge per staff message. At the time of the call the patient states that she is concerned about a bruise on the back of her leg and that it feels like a knot is there. Please call to discuss.

## 2018-12-05 ENCOUNTER — Telehealth: Payer: Self-pay

## 2018-12-05 NOTE — Telephone Encounter (Signed)
Spoke to pt. Went over monitor instructions with her. Verified address. Ordered 14 day Zio to be mailed to pt's home.

## 2018-12-13 ENCOUNTER — Ambulatory Visit (INDEPENDENT_AMBULATORY_CARE_PROVIDER_SITE_OTHER): Payer: Medicare Other

## 2018-12-13 DIAGNOSIS — R002 Palpitations: Secondary | ICD-10-CM | POA: Diagnosis not present

## 2018-12-26 ENCOUNTER — Ambulatory Visit: Payer: Medicare Other | Admitting: Physician Assistant

## 2018-12-26 NOTE — Progress Notes (Deleted)
Cardiology Office Note    Date:  12/26/2018   ID:  Aimee MaclachlanDoris A [name], DOB [birth year], MRN [id number]  PCP:  Jarome MatinPaterson, Daniel, MD  Cardiologist:  Dr. Tenny Crawoss  Chief Complaint: Hospital follow up   History of Present Illness:   [name] is a 85 y.o. female with a PMH of CAD s/p CABG in 2000, HTN, HLD, SVT s/p ablation, panic attacks, and hypothyroidism presents for hospital follow up.   NST in 2013 which showed no evidence of ischemia.  Her last echocardiogram was 03/2018 which showed EF 55-60%, G1DD, mildly dilated LA, mild MR/TR, and mild pulmHTN.  Admitted 11/2018 for chest pain, dizziness and palpitations. Stress test was low risk. Recommended outpatient monitor.   Here for follow up.   Past Medical History:  Diagnosis Date  . Anemia   . Coronary artery disease   . Diabetes mellitus   . DJD (degenerative joint disease)   . Dysrhythmia   . Edema   . GERD (gastroesophageal reflux disease)   . Hiatal hernia   . History of blood transfusion   . Hyperlipidemia   . Hypertension   . Hypothyroidism   . IBS (irritable bowel syndrome)   . SVT (supraventricular tachycardia) ([facility])    with ablation    Past Surgical History:  Procedure Laterality Date  . APPENDECTOMY    . BREAST SURGERY     biopsy per left breast   . CARDIAC CATHETERIZATION    . CHOLECYSTECTOMY  2005  . CORONARY ARTERY BYPASS GRAFT  2000  . EYE SURGERY     cataract surgery bilat   . REPLACEMENT TOTAL KNEE  2009   left    Current Medications: Prior to Admission medications   Medication Sig Start Date End Date Taking? Authorizing Provider  acetaminophen (TYLENOL) 500 MG tablet Take 1,000 mg by mouth 2 (two) times daily as needed for mild pain or moderate pain.     [provider]  amLODipine (NORVASC) 10 MG tablet TAKE 1 TABLET BY MOUTH EVERY DAY Patient taking differently: Take 10 mg by mouth daily.  04/16/18   Pricilla Riffleoss, Paula V, MD  aspirin 81 MG tablet Take 81 mg by mouth daily.       [provider]  atorvastatin (LIPITOR) 80 MG tablet TAKE 1 TABLET BY MOUTH EVERY DAY IN THE EVENING Patient taking differently: Take 80 mg by mouth every evening.  11/20/18   Pricilla Riffleoss, Paula V, MD  calcium carbonate (TUMS - DOSED IN MG ELEMENTAL CALCIUM) 500 MG chewable tablet Chew 1 tablet by mouth as needed for indigestion or heartburn.     [provider]  ergocalciferol (VITAMIN D2) 50000 UNITS capsule Take 50,000 Units by mouth once a week.      [provider]  esomeprazole (NEXIUM) 20 MG capsule Take 20 mg by mouth daily before breakfast.    [provider]  ezetimibe (ZETIA) 10 MG tablet Take 0.5 tablets (5 mg total) by mouth daily. 10/08/18   Pricilla Riffleoss, Paula V, MD  furosemide (LASIX) 20 MG tablet Take 1 tablet (20 mg total) by mouth daily. 05/21/18   Pricilla Riffleoss, Paula V, MD  KLOR-CON M20 20 MEQ tablet TAKE 1 TABLET BY MOUTH EVERY DAY Patient taking differently: Take 20 mEq by mouth daily.  05/06/18   Pricilla Riffleoss, Paula V, MD  levothyroxine (SYNTHROID, LEVOTHROID) 75 MCG tablet Take 75 mcg by mouth daily.      [provider]  lisinopril (PRINIVIL,ZESTRIL) 5 MG tablet TAKE 1 TABLET BY  MOUTH EVERY DAY Patient taking differently: Take 5 mg by mouth daily.  12/19/11   Wendall Stade, MD  metoprolol tartrate (LOPRESSOR) 25 MG tablet Take 1 tablet (25 mg total) by mouth 2 (two) times daily. 07/29/18   Pricilla Riffle, MD  nitroGLYCERIN (NITROSTAT) 0.4 MG SL tablet Place 1 tablet (0.4 mg total) under the tongue every 5 (five) minutes x 3 doses as needed for chest pain. 12/07/15   Ellsworth Lennox, PA-C    Allergies:   Actos [pioglitazone hydrochloride] and Codeine   Social History   Socioeconomic History  . Marital status: Single    Spouse name: Not on file  . Number of children: Not on file  . Years of education: Not on file  . Highest education level: Not on file  Occupational History  . Occupation: retired    Associate Professor: RETIRED  Social Needs  . Financial  resource strain: Not on file  . Food insecurity    Worry: Not on file    Inability: Not on file  . Transportation needs    Medical: Not on file    Non-medical: Not on file  Tobacco Use  . Smoking status: Never Smoker  . Smokeless tobacco: Never Used  Substance and Sexual Activity  . Alcohol use: No  . Drug use: No  . Sexual activity: Not on file  Lifestyle  . Physical activity    Days per week: Not on file    Minutes per session: Not on file  . Stress: Not on file  Relationships  . Social Musician on phone: Not on file    Gets together: Not on file    Attends religious service: Not on file    Active member of club or organization: Not on file    Attends meetings of clubs or organizations: Not on file    Relationship status: Not on file  Other Topics Concern  . Not on file  Social History Narrative  . Not on file     Family History:  The patient's family history includes Breast cancer in her sister; Coronary artery disease in her brother; Diabetes in her sister; Heart attack (age of onset: 41) in her father; Heart attack (age of onset: 50) in her mother; Heart disease in an other family member. ***  ROS:   Please see the history of present illness.    ROS All other systems reviewed and are negative.   PHYSICAL EXAM:   VS:  There were no vitals taken for this visit.   GEN: Well nourished, well developed, in no acute distress  HEENT: normal  Neck: no JVD, carotid bruits, or masses Cardiac: ***RRR; no murmurs, rubs, or gallops,no edema  Respiratory:  clear to auscultation bilaterally, normal work of breathing GI: soft, nontender, nondistended, + BS MS: no deformity or atrophy  Skin: warm and dry, no rash Neuro:  Alert and Oriented x 3, Strength and sensation are intact Psych: euthymic mood, full affect  Wt Readings from Last 3 Encounters:  11/29/18 142 lb (64.4 kg)  07/29/18 149 lb 9.6 oz (67.9 kg)  03/29/18 155 lb 6.4 oz (70.5 kg)      Studies/Labs  Reviewed:   EKG:  EKG is ordered today.  The ekg ordered today demonstrates ***  Recent Labs: 03/29/2018: NT-Pro BNP 412 11/28/2018: ALT 12; BUN 9; Creatinine, Ser 0.90; Hemoglobin 13.0; Platelets 147; Potassium 4.2; Sodium 131; TSH 0.869   Lipid Panel    Component Value Date/Time  CHOL 166 03/29/2018 0911   TRIG 121 03/29/2018 0911   HDL 80 03/29/2018 0911   CHOLHDL 2.1 03/29/2018 0911   CHOLHDL 2.6 12/07/2015 0440   VLDL 24 12/07/2015 0440   LDLCALC 62 03/29/2018 0911    Additional studies/ records that were reviewed today include:   Stress test 11/29/2018 IMPRESSION: 1. No reversible ischemia or infarction.  2. Mild anterior septal hypokinesis.  3. Left ventricular ejection fraction 70%  4. Non invasive risk stratification*: Low    ASSESSMENT & PLAN:    1. ***    Medication Adjustments/Labs and Tests Ordered: Current medicines are reviewed at length with the patient today.  Concerns regarding medicines are outlined above.  Medication changes, Labs and Tests ordered today are listed in the Patient Instructions below. There are no Patient Instructions on file for this visit.   Jarrett Soho, [place]  12/26/2018 8:07 AM    [facility] [address], [place], [place]  [address] Phone: [phone]; Fax: [phone]

## 2019-01-08 ENCOUNTER — Other Ambulatory Visit: Payer: Medicare Other | Admitting: *Deleted

## 2019-01-08 ENCOUNTER — Other Ambulatory Visit: Payer: Self-pay

## 2019-01-08 DIAGNOSIS — E782 Mixed hyperlipidemia: Secondary | ICD-10-CM

## 2019-01-08 DIAGNOSIS — I2510 Atherosclerotic heart disease of native coronary artery without angina pectoris: Secondary | ICD-10-CM

## 2019-01-08 LAB — LIPID PANEL
Chol/HDL Ratio: 2 ratio (ref 0.0–4.4)
Cholesterol, Total: 134 mg/dL (ref 100–199)
HDL: 67 mg/dL (ref >39)
LDL Chol Calc (NIH): 46 mg/dL (ref 0–99)
Triglycerides: 121 mg/dL (ref 0–149)
VLDL Cholesterol Cal: 21 mg/dL (ref 5–40)

## 2019-01-14 ENCOUNTER — Ambulatory Visit: Payer: Medicare Other | Admitting: Physician Assistant

## 2019-01-16 ENCOUNTER — Telehealth: Payer: Self-pay | Admitting: Internal Medicine

## 2019-01-16 NOTE — Telephone Encounter (Signed)
New Message  Pt c/o medication issue:  1. Name of Medication: atorvastatin (LIPITOR) 80 MG tablet  2. How are you currently taking this medication (dosage and times per day)? Not currently taking medication   3. Are you having a reaction (difficulty breathing--STAT)? No  4. What is your medication issue? Patient is calling regards to Dr. Alan Ripper nurse telling her to currently stop taking atorvastatin (LIPITOR) 80 MG tablet medication until seen for appointment that was scheduled for 01/14/19. Patients 01/14/19 appointment was canceled and patient is wanting to know if she should take medication again or continue to stop.

## 2019-01-16 NOTE — Telephone Encounter (Signed)
Spoke with pt re: Lipitor.  Pt. States she has seen improvement with her leg pain since being off of her Lipitor. And is watching her diet. Pt advised not to restart Lipitor until she sees Dr Harrington Challenger at her 02/06/2019 appointment.  Member verbalizes understanding and agrees with this plan.

## 2019-01-17 NOTE — Telephone Encounter (Signed)
Pt was instructed on 01/09/19 to hold lipitor until 11/17 appointment according to lab results recorded that day. Then 11/17 appointment was cancelled due to provider (APP) not being in the office.  Her next return appt is 02/06/19.

## 2019-01-21 ENCOUNTER — Telehealth: Payer: Self-pay | Admitting: *Deleted

## 2019-01-21 MED ORDER — METOPROLOL TARTRATE 25 MG PO TABS: 12.5000 mg | ORAL_TABLET | Freq: Two times a day (BID) | ORAL | 3 refills | Status: DC

## 2019-01-21 NOTE — Telephone Encounter (Signed)
Informed patient of results/recommendations from Dr. Harrington Challenger.  Repeated back to me several times so she remembers to take just 1/2 of metoprolol daily. Has follow up with Dr. Harrington Challenger on 12/10. She will try to take BP daily and record and bring a list of readings with her to the appointment.

## 2019-01-21 NOTE — Telephone Encounter (Signed)
-----   Message from Fay Records, MD sent at 01/09/2019 10:35 PM EST ----- Pt had slow heart rates at times   Longest pauses 3 seconds I would recomm cutting back on metoprolol to 12.5 mg daily Follow BP  On new dose   May need to adjust meds further  Call in a couple wks with response

## 2019-02-01 NOTE — Progress Notes (Signed)
.    Cardiology Office Note   Date:  02/06/2019   ID:  [name], DOB [birth year], MRN [id number]  PCP:  Jarome MatinPaterson, Daniel, MD  Cardiologist:   Dietrich PatesPaula Ross, MD    F/U of CAD     History of Present Illness: [name] is a 85 y.o. female with a history of CAD (s/p CABG in 2000.  Myoview in 2013 was normal.   She was admitted to [facility] in October 2016 Complained of CP and    Myoview scan was normal    I saw her in Jan  2020   At that time she had some ankle swelling   No CP   SOme SOB   Echo ordered   This showed normal LV systolic function   Labs done   I recomm taking 1 to 2 lasix per week   She did this and it helped   I aw the pt in June 2020  She was admitted in October with chest tightness, SOB she said the episodes felt like a panic attack with her heart racing.  Did not know what to do so went to the emergency room.  Myovue done which was neg for ischemia     In November the patient called she was on 80 mg of Lipitor and Zetia.  After stopping she says she felt better with no leg pain.  Has not restarted.  Her lipids were very good before stopping  with an LDL of 46. Outpatient Medications Prior to Visit  Medication Sig Dispense Refill  . acetaminophen (TYLENOL) 500 MG tablet Take 1,000 mg by mouth 2 (two) times daily as needed for mild pain or moderate pain.     Marland Kitchen. amLODipine (NORVASC) 10 MG tablet TAKE 1 TABLET BY MOUTH EVERY DAY (Patient taking differently: Take 10 mg by mouth daily. ) 90 tablet 3  . aspirin 81 MG tablet Take 81 mg by mouth daily.      Marland Kitchen. atorvastatin (LIPITOR) 80 MG tablet TAKE 1 TABLET BY MOUTH EVERY DAY IN THE EVENING 90 tablet 2  . calcium carbonate (TUMS - DOSED IN MG ELEMENTAL CALCIUM) 500 MG chewable tablet Chew 1 tablet by mouth as needed for indigestion or heartburn.     . ergocalciferol (VITAMIN D2) 50000 UNITS capsule Take 50,000 Units by mouth once a week.      . esomeprazole (NEXIUM) 20 MG capsule Take 20 mg by mouth daily before  breakfast.    . ezetimibe (ZETIA) 10 MG tablet Take 0.5 tablets (5 mg total) by mouth daily. 45 tablet 3  . furosemide (LASIX) 20 MG tablet Take 1 tablet (20 mg total) by mouth daily. 90 tablet 3  . KLOR-CON M20 20 MEQ tablet TAKE 1 TABLET BY MOUTH EVERY DAY (Patient taking differently: Take 20 mEq by mouth daily. ) 90 tablet 3  . levothyroxine (SYNTHROID, LEVOTHROID) 75 MCG tablet Take 75 mcg by mouth daily.      Marland Kitchen. lisinopril (PRINIVIL,ZESTRIL) 5 MG tablet TAKE 1 TABLET BY MOUTH EVERY DAY (Patient taking differently: Take 5 mg by mouth daily. ) 30 tablet 6  . metoprolol tartrate (LOPRESSOR) 25 MG tablet Take 0.5 tablets (12.5 mg total) by mouth 2 (two) times daily. 45 tablet 3  . nitroGLYCERIN (NITROSTAT) 0.4 MG SL tablet Place 1 tablet (0.4 mg total) under the tongue every 5 (five) minutes x 3 doses as needed for chest pain. 25 tablet 2   No facility-administered medications prior to visit.  Allergies:   Actos [pioglitazone hydrochloride] and Codeine   Past Medical History:  Diagnosis Date  . Anemia   . Coronary artery disease   . Diabetes mellitus   . DJD (degenerative joint disease)   . Dysrhythmia   . Edema   . GERD (gastroesophageal reflux disease)   . Hiatal hernia   . History of blood transfusion   . Hyperlipidemia   . Hypertension   . Hypothyroidism   . IBS (irritable bowel syndrome)   . SVT (supraventricular tachycardia) (HCC)    with ablation    Past Surgical History:  Procedure Laterality Date  . APPENDECTOMY    . BREAST SURGERY     biopsy per left breast   . CARDIAC CATHETERIZATION    . CHOLECYSTECTOMY  2005  . CORONARY ARTERY BYPASS GRAFT  2000  . EYE SURGERY     cataract surgery bilat   . REPLACEMENT TOTAL KNEE  2009   left     Social History:  The patient  reports that she has never smoked. She has never used smokeless tobacco. She reports that she does not drink alcohol or use drugs.   Family History:  The patient's family history includes Breast  cancer in her sister; Coronary artery disease in her brother; Diabetes in her sister; Heart attack (age of onset: 23) in her father; Heart attack (age of onset: 46) in her mother; Heart disease in an other family member.    ROS:  Please see the history of present illness. All other systems are reviewed and  Negative to the above problem except as noted.    PHYSICAL EXAM: VS:  BP (!) 142/68   Pulse 76   Ht 5\' 3"  (1.6 m)   Wt 140 lb 12.8 oz (63.9 kg)   SpO2 97%   BMI 24.94 kg/m   GEN: Well nourished, well developed, in no acute distress  HEENT: normal  Neck: JVP is normal NO, carotid bruits, or masses Cardiac: RRR;   No murmurs, rubs, or gallops, 1+ nonpitting  edema  Respiratory:  clear to auscultation bilaterally, normal work of breathing GI: soft, nontender, nondistended, + BS  No hepatomegaly  MS: no deformity Moving all extremities   Skin: warm and dry, no rash Neuro:  Strength and sensation are intact Psych: euthymic mood, full affect   EKG:  EKG is not ordered today    Lipid Panel    Component Value Date/Time   CHOL 134 01/08/2019 0831   TRIG 121 01/08/2019 0831   HDL 67 01/08/2019 0831   CHOLHDL 2.0 01/08/2019 0831   CHOLHDL 2.6 12/07/2015 0440   VLDL 24 12/07/2015 0440   LDLCALC 46 01/08/2019 0831      Wt Readings from Last 3 Encounters:  02/06/19 140 lb 12.8 oz (63.9 kg)  11/29/18 142 lb (64.4 kg)  07/29/18 149 lb 9.6 oz (67.9 kg)      ASSESSMENT AND PLAN:  1  CAD   Remote CABG   recent Myoview this past fall was negative for ischemia.  I would continue to follow.  2 LE edema   no lower extremity edema today.  Tolerating current regimen.  3  HL would not restart Lipitor or Zetia for right now.  Will add Crestor 10 mg.  Follow-up lipids in March.  Call if she gets achiness.  4  HTN  BP has been a little high initially higher in November once 50s 170.  Now 140s 150s.  I would recommend increasing lisinopril to  10.  Would convert her metoprolol one half  twice daily to Toprol-XL 25 daily.  Come back in at the end of March.  The patient is anxious about her blood pressure, coronary disease, has panic type attacks.  I reassured her. Follow-up in March.   Current medicines are reviewed at length with the patient today.  The patient does not have concerns regarding medicines.  Signed, Dorris Carnes, MD  02/06/2019 11:38 AM    [facility] [place], [place], [place]  [address] Phone: [phone]; Fax: [phone]

## 2019-02-06 ENCOUNTER — Ambulatory Visit: Payer: Medicare Other | Admitting: Internal Medicine

## 2019-02-06 ENCOUNTER — Encounter: Payer: Self-pay | Admitting: Internal Medicine

## 2019-02-06 ENCOUNTER — Other Ambulatory Visit: Payer: Self-pay

## 2019-02-06 VITALS — BP 142/68 | HR 76 | Ht 63.0 in | Wt 140.8 lb

## 2019-02-06 DIAGNOSIS — I10 Essential (primary) hypertension: Secondary | ICD-10-CM | POA: Diagnosis not present

## 2019-02-06 DIAGNOSIS — E782 Mixed hyperlipidemia: Secondary | ICD-10-CM

## 2019-02-06 MED ORDER — ROSUVASTATIN CALCIUM 10 MG PO TABS: 10.0000 mg | ORAL_TABLET | Freq: Every day | ORAL | 3 refills | Status: DC

## 2019-02-06 MED ORDER — LISINOPRIL 10 MG PO TABS: 10.0000 mg | ORAL_TABLET | Freq: Every day | ORAL | 3 refills | Status: DC

## 2019-02-06 MED ORDER — METOPROLOL SUCCINATE ER 25 MG PO TB24: 25.0000 mg | ORAL_TABLET | Freq: Every day | ORAL | 3 refills | Status: DC

## 2019-02-06 NOTE — Patient Instructions (Addendum)
Medication Instructions:  Your physician has recommended you make the following change in your medication:  1.) increase lisinopril to 10 mg daily for blood pressure  2.) change metoprolol tartrate to metoprolol succinate (Toprol- XL) take 25 mg once a day for blood pressure  3.) Start rosuvastatin (Crestor) 10 mg once a day for cholesterol/lipids  *If you need a refill on your cardiac medications before your next appointment, please call your pharmacy*  Lab Work: At your next appointment --check lipids If you have labs (blood work) drawn today and your tests are completely normal, you will receive your results only by: Marland Kitchen MyChart Message (if you have MyChart) OR . A paper copy in the mail If you have any lab test that is abnormal or we need to change your treatment, we will call you to review the results.  Testing/Procedures: none  Follow-Up: At [facility], you and your health needs are our priority.  As part of our continuing mission to provide you with exceptional heart care, we have created designated Provider Care Teams.  These Care Teams include your primary Cardiologist (physician) and Advanced Practice Providers (APPs -  Physician Assistants and Nurse Practitioners) who all work together to provide you with the care you need, when you need it.  Your next appointment:   3 month(s)  The format for your next appointment:   In Person  Provider:   You may see or one of the following Advanced Practice Providers on your designated Care Team:    Richardson Dopp, [place]     Other Instructions

## 2019-04-09 ENCOUNTER — Other Ambulatory Visit: Payer: Self-pay | Admitting: Internal Medicine

## 2019-04-26 ENCOUNTER — Other Ambulatory Visit: Payer: Self-pay | Admitting: Internal Medicine

## 2019-04-30 ENCOUNTER — Emergency Department (HOSPITAL_COMMUNITY)
Admission: EM | Admit: 2019-04-30 | Discharge: 2019-04-30 | Disposition: A | Discharge status: Home / Self Care | Payer: Medicare Other | Attending: Emergency Medicine | Admitting: Emergency Medicine

## 2019-04-30 ENCOUNTER — Emergency Department (HOSPITAL_COMMUNITY): Payer: Medicare Other

## 2019-04-30 ENCOUNTER — Emergency Department (HOSPITAL_BASED_OUTPATIENT_CLINIC_OR_DEPARTMENT_OTHER)
Admit: 2019-04-30 | Discharge: 2019-04-30 | Disposition: A | Discharge status: Home / Self Care | Payer: Medicare Other | Attending: Emergency Medicine | Admitting: Emergency Medicine

## 2019-04-30 ENCOUNTER — Encounter (HOSPITAL_COMMUNITY): Payer: Self-pay

## 2019-04-30 ENCOUNTER — Other Ambulatory Visit: Payer: Self-pay

## 2019-04-30 DIAGNOSIS — M5442 Lumbago with sciatica, left side: Secondary | ICD-10-CM | POA: Diagnosis not present

## 2019-04-30 DIAGNOSIS — I10 Essential (primary) hypertension: Secondary | ICD-10-CM | POA: Insufficient documentation

## 2019-04-30 DIAGNOSIS — E039 Hypothyroidism, unspecified: Secondary | ICD-10-CM | POA: Diagnosis not present

## 2019-04-30 DIAGNOSIS — R350 Frequency of micturition: Secondary | ICD-10-CM | POA: Insufficient documentation

## 2019-04-30 DIAGNOSIS — R609 Edema, unspecified: Secondary | ICD-10-CM

## 2019-04-30 DIAGNOSIS — G8929 Other chronic pain: Secondary | ICD-10-CM | POA: Diagnosis not present

## 2019-04-30 DIAGNOSIS — M545 Low back pain: Secondary | ICD-10-CM | POA: Diagnosis present

## 2019-04-30 DIAGNOSIS — R509 Fever, unspecified: Secondary | ICD-10-CM | POA: Diagnosis not present

## 2019-04-30 DIAGNOSIS — Z79899 Other long term (current) drug therapy: Secondary | ICD-10-CM | POA: Diagnosis not present

## 2019-04-30 DIAGNOSIS — Z7982 Long term (current) use of aspirin: Secondary | ICD-10-CM | POA: Diagnosis not present

## 2019-04-30 DIAGNOSIS — M542 Cervicalgia: Secondary | ICD-10-CM | POA: Diagnosis not present

## 2019-04-30 LAB — CBC WITH DIFFERENTIAL/PLATELET
Abs Immature Granulocytes: 0.17 10*3/uL — ABNORMAL HIGH (ref 0.00–0.07)
Basophils Absolute: 0.1 10*3/uL (ref 0.0–0.1)
Basophils Relative: 0 %
Eosinophils Absolute: 0 10*3/uL (ref 0.0–0.5)
Eosinophils Relative: 0 %
HCT: 36.5 % (ref 36.0–46.0)
Hemoglobin: 12.8 g/dL (ref 12.0–15.0)
Immature Granulocytes: 1 %
Lymphocytes Relative: 6 %
Lymphs Abs: 1 10*3/uL (ref 0.7–4.0)
MCH: 31.7 pg (ref 26.0–34.0)
MCHC: 35.1 g/dL (ref 30.0–36.0)
MCV: 90.3 fL (ref 80.0–100.0)
Monocytes Absolute: 2.2 10*3/uL — ABNORMAL HIGH (ref 0.1–1.0)
Monocytes Relative: 12 %
Neutro Abs: 15.1 10*3/uL — ABNORMAL HIGH (ref 1.7–7.7)
Neutrophils Relative %: 81 %
Platelets: 185 10*3/uL (ref 150–400)
RBC: 4.04 MIL/uL (ref 3.87–5.11)
RDW: 12.4 % (ref 11.5–15.5)
WBC: 18.5 10*3/uL — ABNORMAL HIGH (ref 4.0–10.5)
nRBC: 0 % (ref 0.0–0.2)

## 2019-04-30 LAB — COMPREHENSIVE METABOLIC PANEL
ALT: 14 U/L (ref 0–44)
AST: 19 U/L (ref 15–41)
Albumin: 3.8 g/dL (ref 3.5–5.0)
Alkaline Phosphatase: 42 U/L (ref 38–126)
Anion gap: 11 (ref 5–15)
BUN: 10 mg/dL (ref 8–23)
CO2: 20 mmol/L — ABNORMAL LOW (ref 22–32)
Calcium: 9 mg/dL (ref 8.9–10.3)
Chloride: 98 mmol/L (ref 98–111)
Creatinine, Ser: 0.86 mg/dL (ref 0.44–1.00)
GFR calc Af Amer: >60 mL/min (ref >60)
GFR calc non Af Amer: >60 mL/min (ref >60)
Glucose, Bld: 178 mg/dL — ABNORMAL HIGH (ref 70–99)
Potassium: 3.8 mmol/L (ref 3.5–5.1)
Sodium: 129 mmol/L — ABNORMAL LOW (ref 135–145)
Total Bilirubin: 1.5 mg/dL — ABNORMAL HIGH (ref 0.3–1.2)
Total Protein: 6.6 g/dL (ref 6.5–8.1)

## 2019-04-30 LAB — URINALYSIS, ROUTINE W REFLEX MICROSCOPIC
Bilirubin Urine: NEGATIVE
Glucose, UA: NEGATIVE mg/dL
Ketones, ur: NEGATIVE mg/dL
Leukocytes,Ua: NEGATIVE
Nitrite: NEGATIVE
Protein, ur: NEGATIVE mg/dL
Specific Gravity, Urine: 1.012 (ref 1.005–1.030)
pH: 5 (ref 5.0–8.0)

## 2019-04-30 LAB — PROTIME-INR
INR: 0.9 (ref 0.8–1.2)
Prothrombin Time: 12.3 seconds (ref 11.4–15.2)

## 2019-04-30 MED ORDER — ACETAMINOPHEN 325 MG PO TABS
650.0000 mg | ORAL_TABLET | Freq: Once | ORAL | Status: AC
  Administered 2019-04-30: 15:00:00 650 mg via ORAL
  Filled 2019-04-30: qty 2

## 2019-04-30 MED ORDER — ACETAMINOPHEN 325 MG PO TABS: 650.0000 mg | ORAL_TABLET | Freq: Four times a day (QID) | ORAL | 0 refills | Status: DC | PRN

## 2019-04-30 MED ORDER — MIDAZOLAM HCL 2 MG/2ML IJ SOLN
2.0000 mg | Freq: Once | INTRAMUSCULAR | Status: AC
  Administered 2019-04-30: 18:00:00 2 mg via INTRAVENOUS
  Filled 2019-04-30: qty 2

## 2019-04-30 MED ORDER — SODIUM CHLORIDE 0.9 % IV BOLUS
500.0000 mL | Freq: Once | INTRAVENOUS | Status: AC
  Administered 2019-04-30: 500 mL via INTRAVENOUS

## 2019-04-30 MED ORDER — IOHEXOL 300 MG/ML  SOLN
100.0000 mL | Freq: Once | INTRAMUSCULAR | Status: AC | PRN
  Administered 2019-04-30: 20:00:00 100 mL via INTRAVENOUS

## 2019-04-30 MED ORDER — GADOBUTROL 1 MMOL/ML IV SOLN
6.0000 mL | Freq: Once | INTRAVENOUS | Status: AC | PRN
  Administered 2019-04-30: 19:00:00 6 mL via INTRAVENOUS

## 2019-04-30 NOTE — Progress Notes (Signed)
Lower extremity venous has been completed.   Preliminary results in CV Proc.   Blanch Media 04/30/2019 4:03 PM

## 2019-04-30 NOTE — ED Triage Notes (Signed)
Pt from home with PTAR for left hip pain that radiates to her left hip. Pt also has fever (101.7 orally with PTAR) 99.8 in triage. Pt denies cough, or abd pain. Pt a.o

## 2019-04-30 NOTE — ED Notes (Signed)
Patient verbalizes understanding of discharge instructions. Opportunity for questioning and answers were provided. Armband removed by staff, pt discharged from ED ambulatory.   

## 2019-04-30 NOTE — ED Provider Notes (Addendum)
Clinical Course as of May 01 1320  Wed Apr 30, 2019  1605 Pt signed out to me by Dr. Madilyn Hook.  Briefly 86 yo female presenting from home with several days of low back and left leg pain.  DVT ultrasound negative here.  UA unremarkable.  WBC 18.5, afebrile here but 99.45F.   Pending MRI spine with and without contrast to evaluate for possible spinal abscess vs discitis (no clear risk factors).     [MT]  1646 On my initial exam patient confirms abrupt onset of bilateral back pain on Saturday that travels down her left leg.  She denies falls or trauma.  She says it is constant.  She denies GI symptoms, abdominal pain, or diarrhea.  She reports pain near her L-spine.  It is not clearly reproducible on exam, but she has some worsening with left leg raise.  There is no evidence of cellulitis or septic arthritis (excellent ROM with no tenderness of the hip, knee, and ankle).  She has no neurological deficits to suggest cord impingement.  She has brisk, equal pedal and femoral pulses, and no hx of AAA (per 2016 CT abdomen), no smoking hx, and no bounding abdominal mass to suggest dissection or AAA.  She has no sig abdominal pain to suggest mesenteric ischemia.  She looks quite comfortable.  Plan to f/u on MRI imaging.     [MT]  2004 IMPRESSION: Motion degraded study.  No evidence of discitis/osteomyelitis. No epidural collection  Multilevel degenerative changes as detailed above. Most notably, there is severe canal stenosis at L4-L5 with crowding of the cauda equina.   [MT]  2029 Spoke to Dr Franky Macho about MR imaging - compression near cuada equina, if benign neuro exam he recommends office f/u.  Doubtful this is cause of fever.  Pending CT imaging   [MT]  2057  IMPRESSION: 1. No acute abdominal or pelvic findings. 2. Benign renal cysts. 3. No abscess or infection identified.   [MT]    Clinical Course User Index [MT] Katherinne Mofield, Kermit Balo, MD   Patient was discharged with instructions to f/u with  neurosurgeon as an outpatient, take tylenol for pain, take caution with ambulation.     Terald Sleeper, MD 05/01/19 6606    Terald Sleeper, MD 05/02/19 1322

## 2019-04-30 NOTE — Discharge Instructions (Signed)
You have an issue in your lower spinal cord, called stenosis of your spinal canal.  This can be causing your back pain and leg symptoms.  You need to follow up with a neurosurgeon for this.  I included their office number above.  Take tylenol at home 650 mg every 6 hours as needed for pain.  I also printed a copy of your MRI report for your records.  Your CT scan of the abdomen did NOT show any emergent findings, or any infection.

## 2019-04-30 NOTE — ED Provider Notes (Signed)
[facility] EMERGENCY DEPARTMENT Provider Note   CSN: [id number] Arrival date & time: 04/30/19  1054     History Chief Complaint  Patient presents with  . Leg Pain  . Fever    [name] is a 86 y.o. female.  The history is provided by the patient and medical records. No language interpreter was used.  Leg Pain Associated symptoms: fever   Fever  [name] is a 86 y.o. female who presents to the Emergency Department complaining of leg pain. She presents the emergency department for evaluation of pain in her low back and left leg. Pain began 2 to 3 days ago and is located across her entire low back. She does have pain that radiates down her entire left lower extremity as well. Pain is worse with laying down in her leg. No reports of injuries. She denies any numbness, weakness. She is requiring a walker to walk secondary to pain throughout the leg. She did have a temperature to 99.8 at home this morning. She denies any headache, chest pain, shortness of breath, nausea, vomiting. She has chronic neck pain and this is unchanged from baseline. She has chronic urinary frequency and this is unchanged from baseline. She denies any dysuria. No known sick contacts.   she lives at home alone. No history of blood clots. She takes an aspirin daily. PT reports temperature to one and 101.7 orally.  Past Medical History:  Diagnosis Date  . Anemia   . Coronary artery disease   . Diabetes mellitus   . DJD (degenerative joint disease)   . Dysrhythmia   . Edema   . GERD (gastroesophageal reflux disease)   . Hiatal hernia   . History of blood transfusion   . Hyperlipidemia   . Hypertension   . Hypothyroidism   . IBS (irritable bowel syndrome)   . SVT (supraventricular tachycardia) (HCC)    with ablation    Patient Active Problem List   Diagnosis Date Noted  . Chest pain 11/28/2018  . Chest pain on breathing 12/06/2015  . Exertional chest pain   . COUGH  06/24/2009  . HYPOPOTASSEMIA 12/31/2008  . Pure hypercholesterolemia 12/16/2008  . Hyperlipidemia, mixed 08/28/2008  . HYPERTENSION, BENIGN 08/28/2008  . S/P CABG x 3 08/28/2008  . GERD 10/16/2007    Past Surgical History:  Procedure Laterality Date  . APPENDECTOMY    . BREAST SURGERY     biopsy per left breast   . CARDIAC CATHETERIZATION    . CHOLECYSTECTOMY  2005  . CORONARY ARTERY BYPASS GRAFT  2000  . EYE SURGERY     cataract surgery bilat   . REPLACEMENT TOTAL KNEE  2009   left     OB History   No obstetric history on file.     Family History  Problem Relation Age of Onset  . Heart attack Mother 43       died  . Heart attack Father 77       died  . Breast cancer Sister   . Diabetes Sister        and brother  . Heart disease Other        sister, father and brother  . Coronary artery disease Brother        PCI    Social History   Tobacco Use  . Smoking status: Never Smoker  . Smokeless tobacco: Never Used  Substance Use Topics  . Alcohol use: No  . Drug use: No  Home Medications Prior to Admission medications   Medication Sig Start Date End Date Taking? Authorizing Provider  acetaminophen (TYLENOL) 500 MG tablet Take 1,000 mg by mouth 2 (two) times daily as needed for mild pain or moderate pain.     [provider]  amLODipine (NORVASC) 10 MG tablet TAKE 1 TABLET BY MOUTH EVERY DAY 04/09/19   Pricilla Riffle, MD  aspirin 81 MG tablet Take 81 mg by mouth daily.      [provider]  calcium carbonate (TUMS - DOSED IN MG ELEMENTAL CALCIUM) 500 MG chewable tablet Chew 1 tablet by mouth as needed for indigestion or heartburn.     [provider]  ergocalciferol (VITAMIN D2) 50000 UNITS capsule Take 50,000 Units by mouth once a week.      [provider]  esomeprazole (NEXIUM) 20 MG capsule Take 20 mg by mouth daily before breakfast.    [provider]  ezetimibe (ZETIA) 10 MG tablet Take 0.5 tablets (5 mg total)  by mouth daily. 10/08/18   Pricilla Riffle, MD  furosemide (LASIX) 20 MG tablet Take 1 tablet (20 mg total) by mouth daily. 05/21/18   Pricilla Riffle, MD  KLOR-CON M20 20 MEQ tablet TAKE 1 TABLET BY MOUTH EVERY DAY 04/28/19   Pricilla Riffle, MD  levothyroxine (SYNTHROID, LEVOTHROID) 75 MCG tablet Take 75 mcg by mouth daily.      [provider]  lisinopril (ZESTRIL) 10 MG tablet Take 1 tablet (10 mg total) by mouth daily. 02/06/19   Pricilla Riffle, MD  metoprolol succinate (TOPROL XL) 25 MG 24 hr tablet Take 1 tablet (25 mg total) by mouth daily. 02/06/19   Pricilla Riffle, MD  nitroGLYCERIN (NITROSTAT) 0.4 MG SL tablet Place 1 tablet (0.4 mg total) under the tongue every 5 (five) minutes x 3 doses as needed for chest pain. 12/07/15   Strader, Lennart Pall, PA-C  rosuvastatin (CRESTOR) 10 MG tablet Take 1 tablet (10 mg total) by mouth daily. 02/06/19   Pricilla Riffle, MD    Allergies    Actos [pioglitazone hydrochloride], Codeine, and Lipitor [atorvastatin]  Review of Systems   Review of Systems  Constitutional: Positive for fever.  All other systems reviewed and are negative.   Physical Exam Updated Vital Signs BP 138/72 (BP Location: Right Arm)   Pulse 88   Temp 99.8 F (37.7 C) (Oral)   Resp 16   SpO2 96%   Physical Exam Vitals and nursing note reviewed.  Constitutional:      Appearance: She is well-developed.  HENT:     Head: Normocephalic and atraumatic.  Cardiovascular:     Rate and Rhythm: Normal rate and regular rhythm.     Heart sounds: No murmur.  Pulmonary:     Effort: Pulmonary effort is normal. No respiratory distress.     Breath sounds: Normal breath sounds.  Abdominal:     Palpations: Abdomen is soft.     Tenderness: There is no abdominal tenderness. There is no guarding or rebound.  Musculoskeletal:     Comments: Trace edema to bilateral lower extremities. 2+ DP pulses bilaterally. There is tenderness to palpation over the left calf and thigh. No significant  erythema to the left lower extremity. No significant joint swelling or tenderness in the hips, knees, ankles.  Skin:    General: Skin is warm and dry.  Neurological:     Mental Status: She is alert and oriented to person, place, and time.  Comments: Five out of five strength in the right lower extremity, 4+ out of five strength in the distal left lower extremity, five out of five strength in the proximal left lower extremity. Five out of five strength and bilateral upper extremities. Sensation light touch intact in all four extremities.  Psychiatric:        Behavior: Behavior normal.     ED Results / Procedures / Treatments   Labs (all labs ordered are listed, but only abnormal results are displayed) Labs Reviewed  COMPREHENSIVE METABOLIC PANEL - Abnormal; Notable for the following components:      Result Value   Sodium 129 (*)    CO2 20 (*)    Glucose, Bld 178 (*)    Total Bilirubin 1.5 (*)    All other components within normal limits  CBC WITH DIFFERENTIAL/PLATELET - Abnormal; Notable for the following components:   WBC 18.5 (*)    Neutro Abs 15.1 (*)    Monocytes Absolute 2.2 (*)    Abs Immature Granulocytes 0.17 (*)    All other components within normal limits  URINALYSIS, ROUTINE W REFLEX MICROSCOPIC - Abnormal; Notable for the following components:   Hgb urine dipstick SMALL (*)    Bacteria, UA RARE (*)    All other components within normal limits  URINE CULTURE  PROTIME-INR    EKG None  Radiology VAS [place] LOWER EXTREMITY VENOUS (DVT) (ONLY [facility] & [facility])  Result Date: 04/30/2019  Lower Venous DVTStudy Indications: Edema.  Comparison Study: no prior Performing Technologist: Blanch Media RVS  Examination Guidelines: A complete evaluation includes B-mode imaging, spectral Doppler, color Doppler, and power Doppler as needed of all accessible portions of each vessel. Bilateral testing is considered an integral part of a complete examination. Limited examinations for reoccurring  indications may be performed as noted. The reflux portion of the exam is performed with the patient in reverse Trendelenburg.  +-----+---------------+---------+-----------+----------+--------------+ RIGHTCompressibilityPhasicitySpontaneityPropertiesThrombus Aging +-----+---------------+---------+-----------+----------+--------------+ CFV  Full           Yes      Yes                                 +-----+---------------+---------+-----------+----------+--------------+   +---------+---------------+---------+-----------+----------+--------------+ LEFT     CompressibilityPhasicitySpontaneityPropertiesThrombus Aging +---------+---------------+---------+-----------+----------+--------------+ CFV      Full           Yes      Yes                                 +---------+---------------+---------+-----------+----------+--------------+ SFJ      Full                                                        +---------+---------------+---------+-----------+----------+--------------+ FV Prox  Full                                                        +---------+---------------+---------+-----------+----------+--------------+ FV Mid   Full                                                        +---------+---------------+---------+-----------+----------+--------------+  FV DistalFull                                                        +---------+---------------+---------+-----------+----------+--------------+ PFV      Full                                                        +---------+---------------+---------+-----------+----------+--------------+ POP      Full           Yes      Yes                                 +---------+---------------+---------+-----------+----------+--------------+ PTV      Full                                                        +---------+---------------+---------+-----------+----------+--------------+ PERO     Full                                                         +---------+---------------+---------+-----------+----------+--------------+     Summary: RIGHT: - No evidence of common femoral vein obstruction.  LEFT: - There is no evidence of deep vein thrombosis in the lower extremity.  - No cystic structure found in the popliteal fossa.  *See table(s) above for measurements and observations.    Preliminary     Procedures Procedures (including critical care time)  Medications Ordered in ED Medications  acetaminophen (TYLENOL) tablet 650 mg (650 mg Oral Given 04/30/19 1517)  sodium chloride 0.9 % bolus 500 mL (500 mLs Intravenous New Bag/Given 04/30/19 1601)    ED Course  I have reviewed the triage vital signs and the nursing notes.  Pertinent labs & imaging results that were available during my care of the patient were reviewed by me and considered in my medical decision making (see chart for details).  Clinical Course as of Apr 30 1618  Wed Apr 30, 2019  1605 Pt signed out to me by Dr. Madilyn Hook.  Briefly 86 yo female presenting from home with several days of low back and left leg pain.  DVT ultrasound negative here.  UA unremarkable.  WBC 18.5, afebrile here but 99.10F.   Pending MRI spine with and without contrast to evaluate for possible abscess (no clear risk factors).   [MT]    Clinical Course User Index [MT] Terald Sleeper, MD   MDM Rules/Calculators/A&P                     Patient here for evaluation of low back pain, left leg pain and temperature to 99 at home. She is non-toxic appearing on evaluation. She is well perfused. No clear evidence of cellulitis on examination. She does have calf tenderness to the left  lower extremity. Vascular ultrasound is negative for DVT. Given new back pain, left lower extremity pain consistent with sciatica will obtain MRI with and without to rule out epidural abscess. Patient care transferred pending MRI.  BMP with hyponatremia, similar when compared to  [employer].  Final Clinical Impression(s) / ED Diagnoses Final diagnoses:  None    Rx / DC Orders ED Discharge Orders    None       Quintella Reichert, MD 04/30/19 1621

## 2019-05-01 ENCOUNTER — Ambulatory Visit: Payer: Medicare Other | Admitting: Orthopedic Surgery

## 2019-05-01 ENCOUNTER — Ambulatory Visit: Payer: Self-pay

## 2019-05-01 ENCOUNTER — Encounter: Payer: Self-pay | Admitting: Orthopedic Surgery

## 2019-05-01 ENCOUNTER — Other Ambulatory Visit: Payer: Self-pay | Admitting: Internal Medicine

## 2019-05-01 DIAGNOSIS — M25562 Pain in left knee: Secondary | ICD-10-CM

## 2019-05-01 DIAGNOSIS — M48061 Spinal stenosis, lumbar region without neurogenic claudication: Secondary | ICD-10-CM | POA: Diagnosis not present

## 2019-05-01 DIAGNOSIS — G8929 Other chronic pain: Secondary | ICD-10-CM | POA: Diagnosis not present

## 2019-05-01 LAB — URINE CULTURE: Culture: <10000 — AB

## 2019-05-01 NOTE — Progress Notes (Signed)
Office Visit Note   Patient: [name]           Date of Birth: 1932/07/11           MRN: [id number] Visit Date: 05/01/2019              Requested by: Jarome Matin, MD [address] [place],  [place] [address] PCP: Jarome Matin, MD  Chief Complaint  Patient presents with  . Left Knee - Pain  . Left Leg - Pain      HPI: This is a pleasant 86 year old woman who presents with a chief complaint of left-sided back pain which radiates down to her knee.  She denies any previous history of back problems.  She has had no injections or evaluations.  She is status post left knee arthroplasty in 2009.  She states that about a week ago she had acute onset of severe back pain radiating down her left leg she was seen and evaluated in emergency room.  She was discharged and told to limit her ambulation and take Tylenol.  She was also told to follow-up with a neurosurgeon.  She does not have to have any surgery on her back if at all possible.  She had an MRI completed yesterday.  The findings were consistent with degenerative changes in multilevels of her lumbar spine most notably at the L4-5 level which demonstrated severe stenosis she has severely limited her walking which she states is secondary to pain.  She denies any loss of bowel or bladder function she denies any giving out of her legs  Assessment & Plan: Visit Diagnoses:  1. Chronic pain of left knee   2. Spinal stenosis of lumbar region, unspecified whether neurogenic claudication present     Plan: 86 year old woman with multilevel degenerative changes in her spine including severe stenosis at the L4-5 level patient was discussed with Dr. Lajoyce Corners.  She would like to avoid surgery if at all possible he has recommended her to be evaluated by Dr. Alvester Morin with possible injections to see if there is a nonoperative option she understands if she has any acute changes such as weakness or loss of bowel or bladder function she is to go to the  emergency room immediately.  Follow-Up Instructions: No follow-ups on file.   Ortho Exam  Patient is alert, oriented, no adenopathy, well-dressed, normal affect, normal respiratory effort. She is sitting comfortably in her wheelchair.  She traces her pain going down her buttock into her lateral leg to her knee.  Today she has good and equal strength bilaterally no sensation changes.  Imaging: MR Lumbar Spine W Wo Contrast  Result Date: 04/30/2019 CLINICAL DATA:  Low back pain with radicular pain to right lower extremity, low grade fever and leukocytosis EXAM: MRI LUMBAR SPINE WITHOUT AND WITH CONTRAST TECHNIQUE: Multiplanar and multiecho pulse sequences of the lumbar spine were obtained without and with intravenous contrast. CONTRAST:  4mL GADAVIST GADOBUTROL 1 MMOL/ML IV SOLN COMPARISON:  None. FINDINGS: Significant motion artifact is present. Segmentation:  Standard. Alignment: Grade 1 anterolisthesis at L4-L5 mild retrolisthesis at L1-L2 and L3-L4. Vertebrae: Vertebral body heights are maintained apart from degenerative endplate irregularity, greatest at L3. There is minor degenerative endplate marrow edema, for example at L5-S1. No suspicious osseous lesion. No evidence of discitis or osteomyelitis. Conus medullaris and cauda equina: Conus extends to the L1 level. Conus is unremarkable. There is possible reactive nerve root enhancement at the L4-L5 level. Paraspinal and other soft tissues: Partially image distended  bladder. Small calcified uterine fibroid. Otherwise unremarkable. Disc levels: L1-L2: Disc bulge and mild facet arthropathy. No significant canal or foraminal stenosis. L2-L3: Disc bulge and mild facet arthropathy with ligamentum flavum infolding. No significant canal or foraminal stenosis. L3-L4: Disc bulge with endplate osteophytic ridging and moderate facet arthropathy with ligamentum flavum infolding. Moderate canal stenosis with narrowing of the lateral recesses. Mild foraminal  stenosis. L4-L5: Anterolisthesis with uncovering of disc bulge. Moderate right and marked left facet arthropathy with ligamentum flavum infolding. Severe canal stenosis with effacement of the lateral recesses and crowding of the cauda equina. Mild to moderate right and moderate left foraminal stenosis. L5-S1: Disc bulge with endplate osteophytic ridging eccentric to the left and moderate facet arthropathy with ligamentum flavum infolding. No significant canal or right foraminal stenosis. Mild left foraminal stenosis. IMPRESSION: Motion degraded study. No evidence of discitis/osteomyelitis.  No epidural collection Multilevel degenerative changes as detailed above. Most notably, there is severe canal stenosis at L4-L5 with crowding of the cauda equina. Electronically Signed   By: Macy Mis M.D.   On: 04/30/2019 19:16   CT ABDOMEN PELVIS W CONTRAST  Result Date: 04/30/2019 CLINICAL DATA:  Abdominal pain.  Abscess or infection suspected. EXAM: CT ABDOMEN AND PELVIS WITH CONTRAST TECHNIQUE: Multidetector CT imaging of the abdomen and pelvis was performed using the standard protocol following bolus administration of intravenous contrast. CONTRAST:  116mL OMNIPAQUE IOHEXOL 300 MG/ML  SOLN COMPARISON:  CT 12/01/2014 FINDINGS: Lower chest: Lung bases are clear. Hepatobiliary: No focal hepatic lesion. Postcholecystectomy. No biliary dilatation. Pancreas: Pancreas is normal. No ductal dilatation. No pancreatic inflammation. Spleen: Normal spleen Adrenals/urinary tract: Adrenal glands and kidneys are normal. Small simple fluid attenuation lesion upper pole of the LEFT kidney likely represents benign cysts. Subcapsular lesion in the anterior RIGHT hepatic lobe also likely represent benign cysts. Lesions are unchanged from CT 2016. The ureters and bladder normal. Stomach/Bowel: Stomach, small-bowel and cecum are normal. The appendix is not identified but there is no pericecal inflammation to suggest appendicitis. The  colon and rectosigmoid colon are normal. Vascular/Lymphatic: Abdominal aorta is normal caliber with atherosclerotic calcification. There is no retroperitoneal or periportal lymphadenopathy. No pelvic lymphadenopathy. Reproductive: Uterus and ovaries normal for age. Other: No free fluid. Musculoskeletal: No aggressive osseous lesion. IMPRESSION: 1. No acute abdominal or pelvic findings. 2. Benign renal cysts. 3. No abscess or infection identified. Electronically Signed   By: Suzy Bouchard M.D.   On: 04/30/2019 20:52   VAS [place] LOWER EXTREMITY VENOUS (DVT) (ONLY [facility] & [facility])  Result Date: 04/30/2019  Lower Venous DVTStudy Indications: Edema.  Comparison Study: no prior Performing Technologist: Abram Sander RVS  Examination Guidelines: A complete evaluation includes B-mode imaging, spectral Doppler, color Doppler, and power Doppler as needed of all accessible portions of each vessel. Bilateral testing is considered an integral part of a complete examination. Limited examinations for reoccurring indications may be performed as noted. The reflux portion of the exam is performed with the patient in reverse Trendelenburg.  +-----+---------------+---------+-----------+----------+--------------+ RIGHTCompressibilityPhasicitySpontaneityPropertiesThrombus Aging +-----+---------------+---------+-----------+----------+--------------+ CFV  Full           Yes      Yes                                 +-----+---------------+---------+-----------+----------+--------------+   +---------+---------------+---------+-----------+----------+--------------+ LEFT     CompressibilityPhasicitySpontaneityPropertiesThrombus Aging +---------+---------------+---------+-----------+----------+--------------+ CFV      Full           Yes  Yes                                 +---------+---------------+---------+-----------+----------+--------------+ SFJ      Full                                                         +---------+---------------+---------+-----------+----------+--------------+ FV Prox  Full                                                        +---------+---------------+---------+-----------+----------+--------------+ FV Mid   Full                                                        +---------+---------------+---------+-----------+----------+--------------+ FV DistalFull                                                        +---------+---------------+---------+-----------+----------+--------------+ PFV      Full                                                        +---------+---------------+---------+-----------+----------+--------------+ POP      Full           Yes      Yes                                 +---------+---------------+---------+-----------+----------+--------------+ PTV      Full                                                        +---------+---------------+---------+-----------+----------+--------------+ PERO     Full                                                        +---------+---------------+---------+-----------+----------+--------------+     Summary: RIGHT: - No evidence of common femoral vein obstruction.  LEFT: - There is no evidence of deep vein thrombosis in the lower extremity.  - No cystic structure found in the popliteal fossa.  *See table(s) above for measurements and observations. Electronically signed by Coral Else MD on 04/30/2019 at 8:53:40 PM.    Final    No images are attached to the encounter.  Labs: Lab Results  Component  Value Date   HGBA1C 5.6 12/07/2015   ESRSEDRATE 2 11/19/2008     Lab Results  Component Value Date   ALBUMIN 3.8 04/30/2019   ALBUMIN 3.9 11/28/2018   ALBUMIN 4.2 11/18/2008    Lab Results  Component Value Date   MG 1.9 11/19/2008   MG 2.0 03/31/2007   Lab Results  Component Value Date   VD25OH 49 12/11/2008    No results found for: PREALBUMIN CBC EXTENDED Latest  Ref Rng & Units 04/30/2019 11/28/2018 03/29/2018  WBC 4.0 - 10.5 K/uL 18.5(H) 7.5 8.3  RBC 3.87 - 5.11 MIL/uL 4.04 3.89 4.43  HGB 12.0 - 15.0 g/dL 03.2 12.2 48.2  HCT 50.0 - 46.0 % 36.5 35.6(L) 39.3  PLT 150 - 400 K/uL 185 147(L) 211  NEUTROABS 1.7 - 7.7 K/uL 15.1(H) 4.4 -  LYMPHSABS 0.7 - 4.0 K/uL 1.0 1.8 -     There is no height or weight on file to calculate BMI.  Orders:  Orders Placed This Encounter  Procedures  . XR Knee 1-2 Views Left  . Ambulatory referral to Physical Medicine Rehab   No orders of the defined types were placed in this encounter.    Procedures: No procedures performed  Clinical Data: No additional findings.  ROS:  All other systems negative, except as noted in the HPI. Review of Systems  Objective: Vital Signs: There were no vitals taken for this visit.  Specialty Comments:  No specialty comments available.  PMFS History: Patient Active Problem List   Diagnosis Date Noted  . Chest pain 11/28/2018  . Chest pain on breathing 12/06/2015  . Exertional chest pain   . COUGH 06/24/2009  . HYPOPOTASSEMIA 12/31/2008  . Pure hypercholesterolemia 12/16/2008  . Hyperlipidemia, mixed 08/28/2008  . HYPERTENSION, BENIGN 08/28/2008  . S/P CABG x 3 08/28/2008  . GERD 10/16/2007   Past Medical History:  Diagnosis Date  . Anemia   . Coronary artery disease   . Diabetes mellitus   . DJD (degenerative joint disease)   . Dysrhythmia   . Edema   . GERD (gastroesophageal reflux disease)   . Hiatal hernia   . History of blood transfusion   . Hyperlipidemia   . Hypertension   . Hypothyroidism   . IBS (irritable bowel syndrome)   . SVT (supraventricular tachycardia) (HCC)    with ablation    Family History  Problem Relation Age of Onset  . Heart attack Mother 54       died  . Heart attack Father 45       died  . Breast cancer Sister   . Diabetes Sister        and brother  . Heart disease Other        sister, father and brother  . Coronary  artery disease Brother        PCI    Past Surgical History:  Procedure Laterality Date  . APPENDECTOMY    . BREAST SURGERY     biopsy per left breast   . CARDIAC CATHETERIZATION    . CHOLECYSTECTOMY  2005  . CORONARY ARTERY BYPASS GRAFT  2000  . EYE SURGERY     cataract surgery bilat   . REPLACEMENT TOTAL KNEE  2009   left   Social History   Occupational History  . Occupation: retired    Associate Professor: RETIRED  Tobacco Use  . Smoking status: Never Smoker  . Smokeless tobacco: Never Used  Substance and Sexual Activity  . Alcohol use:  No  . Drug use: No  . Sexual activity: Not on file

## 2019-05-19 ENCOUNTER — Ambulatory Visit: Payer: Medicare Other | Admitting: Physical Medicine and Rehabilitation

## 2019-05-19 ENCOUNTER — Ambulatory Visit: Payer: Self-pay

## 2019-05-19 ENCOUNTER — Other Ambulatory Visit: Payer: Self-pay

## 2019-05-19 ENCOUNTER — Encounter: Payer: Self-pay | Admitting: Physical Medicine and Rehabilitation

## 2019-05-19 VITALS — BP 156/75 | HR 78

## 2019-05-19 DIAGNOSIS — M7989 Other specified soft tissue disorders: Secondary | ICD-10-CM

## 2019-05-19 DIAGNOSIS — M4316 Spondylolisthesis, lumbar region: Secondary | ICD-10-CM

## 2019-05-19 DIAGNOSIS — M5416 Radiculopathy, lumbar region: Secondary | ICD-10-CM

## 2019-05-19 DIAGNOSIS — M48062 Spinal stenosis, lumbar region with neurogenic claudication: Secondary | ICD-10-CM

## 2019-05-19 MED ORDER — METHYLPREDNISOLONE ACETATE 80 MG/ML IJ SUSP
40.0000 mg | Freq: Once | INTRAMUSCULAR | Status: AC
  Administered 2019-05-19: 40 mg

## 2019-05-19 NOTE — Progress Notes (Signed)
Numeric Pain Rating Scale and Functional Assessment Average Pain 10   In the last MONTH (on 0-10 scale) has pain interfered with the following?  1. General activity like being  able to carry out your everyday physical activities such as walking, climbing stairs, carrying groceries, or moving a chair?  Rating(10)   +Driver, -BT, -Dye Allergies. 

## 2019-05-20 NOTE — Procedures (Signed)
Lumbosacral Transforaminal Epidural Steroid Injection - Sub-Pedicular Approach with Fluoroscopic Guidance  Patient: [name]      Date of Birth: Jul 07, 1932 MRN: [id number] PCP: Jarome Matin, MD      Visit Date: 05/19/2019   Universal Protocol:    Date/Time: 05/19/2019  Consent Given By: the patient  Position: PRONE  Additional Comments: Vital signs were monitored before and after the procedure. Patient was prepped and draped in the usual sterile fashion. The correct patient, procedure, and site was verified.   Injection Procedure Details:  Procedure Site One Meds Administered:  Meds ordered this encounter  Medications  . methylPREDNISolone acetate (DEPO-MEDROL) injection 40 mg    Laterality: Bilateral  Location/Site:  L4-L5  Needle size: 22 G  Needle type: Spinal  Needle Placement: Transforaminal  Findings:    -Comments: Excellent flow of contrast along the nerve and into the epidural space.  Procedure Details: After squaring off the end-plates to get a true AP view, the C-arm was positioned so that an oblique view of the foramen as noted above was visualized. The target area is just inferior to the "nose of the scotty dog" or sub pedicular. The soft tissues overlying this structure were infiltrated with 2-3 ml. of 1% Lidocaine without Epinephrine.  The spinal needle was inserted toward the target using a "trajectory" view along the fluoroscope beam.  Under AP and lateral visualization, the needle was advanced so it did not puncture dura and was located close the 6 O'Clock position of the pedical in AP tracterory. Biplanar projections were used to confirm position. Aspiration was confirmed to be negative for CSF and/or blood. A 1-2 ml. volume of Isovue-250 was injected and flow of contrast was noted at each level. Radiographs were obtained for documentation purposes.   After attaining the desired flow of contrast documented above, a 0.5 to 1.0 ml test dose of  0.25% Marcaine was injected into each respective transforaminal space.  The patient was observed for 90 seconds post injection.  After no sensory deficits were reported, and normal lower extremity motor function was noted,   the above injectate was administered so that equal amounts of the injectate were placed at each foramen (level) into the transforaminal epidural space.   Additional Comments:  The patient tolerated the procedure well Dressing: 2 x 2 sterile gauze and Band-Aid    Post-procedure details: Patient was observed during the procedure. Post-procedure instructions were reviewed.  Patient left the clinic in stable condition.

## 2019-05-20 NOTE — Progress Notes (Signed)
[name] - 86 y.o. female MRN [id number]  Date of birth: 01/23/1933  Office Visit Note: Visit Date: 05/19/2019 PCP: Jarome Matin, MD Referred by: Jarome Matin, MD  Subjective: Chief Complaint  Patient presents with  . Lower Back - Pain  . Left Leg - Pain, Edema  . Right Leg - Pain, Edema   HPI: [name] is a 86 y.o. female who comes in today For new patient evaluation and potential interventional spine procedure at the request of Dr. Aldean Baker.  Patient reports 10 out of 10 low back pain with right and left buttock pain but with referral down the left leg that significantly worsened about 1 month ago.  She actually sought care at the emergency room because of severe pain.  She does not endorse any specific injury or trauma at all.  She does report standing up and moving 1 time and she felt a pop and then she started having this pain that just will not go away.  She reports that it is worse with walking and standing.  She has been using Tylenol and heating pad.  She did have some prednisone with Dr. Lajoyce Corners that helped a little bit.  She reports off-and-on history of some back pain but nothing prominent.  She has had no prior lumbar surgery or injections.  She does report increased swelling in the legs bilaterally.  She does have a coronary heart history status post CABG.  She is not on any anticoagulation.  She has not noted any abrupt swelling or pain just swelling in both limbs.  She used to be an avid walker and has not been able to walk recently.  She has many questions concerning the pain in the leg and like most of the time she has a hard time grasping that it could be from the spine.  MRI was completed of the lumbar spine and this is reviewed with her today using spine models and imaging.  The report was reviewed by myself.  I also reviewed all the notes from the emergency room and Dr. Lajoyce Corners.  She has not noted any focal weakness.  She has not really had numbness but some  paresthesia.  She has had no bowel or bladder complaints.  She does get some referral into the tailbone but no sacral numbness or other issues there.  MRI showed grade 1 listhesis of L4 on L5 with pretty severe stenosis with left foraminal narrowing more than right.  Review of Systems  Cardiovascular: Positive for claudication and leg swelling.  Musculoskeletal: Positive for back pain.       Left radicular leg pain   Otherwise per HPI.  Assessment & Plan: Visit Diagnoses:  1. Lumbar radiculopathy   2. Spinal stenosis of lumbar region with neurogenic claudication   3. Spondylolisthesis of lumbar region   4. Leg swelling     Plan: Findings:  1.  Chronic history of off-and-on back pain but not severe now has severe back pain and left more than right radicular pain with walking consistent with neurogenic claudication from lumbar stenosis and listhesis of L4 on L5.  Some foraminal narrowing on the left as well.  She is not really fared well with conservative care and medication management at this point and her pain is 10 out of 10.  I did elect today to complete bilateral L4 transforaminal epidural steroid injection diagnostically hopefully therapeutically.  I did discuss typical outcomes with this and we talked about surgical approaches although  she really does not want any surgery at 86 years old.  We did talk about the use of stretching and exercise.  I talked to her at great length about walking etc.  I am going to see her back in a couple weeks after the injection.  2.  In terms of her leg swelling she is going to follow-up with her cardiologist.  I think this is a dependent edema because she is not walking as much as she was because of the pain.  Really no concern at this point for anything like deep vein thrombosis because it is symmetrically right and left.  She still has concerns and will follow up with her cardiologist.    Meds & Orders:  Meds ordered this encounter  Medications  .  methylPREDNISolone acetate (DEPO-MEDROL) injection 40 mg    Orders Placed This Encounter  Procedures  . XR C-ARM NO REPORT  . Epidural Steroid injection    Follow-up: Return if symptoms worsen or fail to improve.   Procedures: No procedures performed  Lumbosacral Transforaminal Epidural Steroid Injection - Sub-Pedicular Approach with Fluoroscopic Guidance  Patient: [name]      Date of Birth: March 03, 1932 MRN: [id number] PCP: Jarome Matin, MD      Visit Date: 05/19/2019   Universal Protocol:    Date/Time: 05/19/2019  Consent Given By: the patient  Position: PRONE  Additional Comments: Vital signs were monitored before and after the procedure. Patient was prepped and draped in the usual sterile fashion. The correct patient, procedure, and site was verified.   Injection Procedure Details:  Procedure Site One Meds Administered:  Meds ordered this encounter  Medications  . methylPREDNISolone acetate (DEPO-MEDROL) injection 40 mg    Laterality: Bilateral  Location/Site:  L4-L5  Needle size: 22 G  Needle type: Spinal  Needle Placement: Transforaminal  Findings:    -Comments: Excellent flow of contrast along the nerve and into the epidural space.  Procedure Details: After squaring off the end-plates to get a true AP view, the C-arm was positioned so that an oblique view of the foramen as noted above was visualized. The target area is just inferior to the "nose of the scotty dog" or sub pedicular. The soft tissues overlying this structure were infiltrated with 2-3 ml. of 1% Lidocaine without Epinephrine.  The spinal needle was inserted toward the target using a "trajectory" view along the fluoroscope beam.  Under AP and lateral visualization, the needle was advanced so it did not puncture dura and was located close the 6 O'Clock position of the pedical in AP tracterory. Biplanar projections were used to confirm position. Aspiration was confirmed to be  negative for CSF and/or blood. A 1-2 ml. volume of Isovue-250 was injected and flow of contrast was noted at each level. Radiographs were obtained for documentation purposes.   After attaining the desired flow of contrast documented above, a 0.5 to 1.0 ml test dose of 0.25% Marcaine was injected into each respective transforaminal space.  The patient was observed for 90 seconds post injection.  After no sensory deficits were reported, and normal lower extremity motor function was noted,   the above injectate was administered so that equal amounts of the injectate were placed at each foramen (level) into the transforaminal epidural space.   Additional Comments:  The patient tolerated the procedure well Dressing: 2 x 2 sterile gauze and Band-Aid    Post-procedure details: Patient was observed during the procedure. Post-procedure instructions were reviewed.  Patient left the  clinic in stable condition.     Clinical History: MRI LUMBAR SPINE WITHOUT AND WITH CONTRAST  TECHNIQUE: Multiplanar and multiecho pulse sequences of the lumbar spine were obtained without and with intravenous contrast.  CONTRAST:  59mL GADAVIST GADOBUTROL 1 MMOL/ML IV SOLN  COMPARISON:  None.  FINDINGS: Significant motion artifact is present.  Segmentation:  Standard.  Alignment: Grade 1 anterolisthesis at L4-L5 mild retrolisthesis at L1-L2 and L3-L4.  Vertebrae: Vertebral body heights are maintained apart from degenerative endplate irregularity, greatest at L3. There is minor degenerative endplate marrow edema, for example at L5-S1. No suspicious osseous lesion. No evidence of discitis or osteomyelitis.  Conus medullaris and cauda equina: Conus extends to the L1 level. Conus is unremarkable. There is possible reactive nerve root enhancement at the L4-L5 level.  Paraspinal and other soft tissues: Partially image distended bladder. Small calcified uterine fibroid. Otherwise  unremarkable.  Disc levels:  L1-L2: Disc bulge and mild facet arthropathy. No significant canal or foraminal stenosis.  L2-L3: Disc bulge and mild facet arthropathy with ligamentum flavum infolding. No significant canal or foraminal stenosis.  L3-L4: Disc bulge with endplate osteophytic ridging and moderate facet arthropathy with ligamentum flavum infolding. Moderate canal stenosis with narrowing of the lateral recesses. Mild foraminal stenosis.  L4-L5: Anterolisthesis with uncovering of disc bulge. Moderate right and marked left facet arthropathy with ligamentum flavum infolding. Severe canal stenosis with effacement of the lateral recesses and crowding of the cauda equina. Mild to moderate right and moderate left foraminal stenosis.  L5-S1: Disc bulge with endplate osteophytic ridging eccentric to the left and moderate facet arthropathy with ligamentum flavum infolding. No significant canal or right foraminal stenosis. Mild left foraminal stenosis.  IMPRESSION: Motion degraded study.  No evidence of discitis/osteomyelitis.  No epidural collection  Multilevel degenerative changes as detailed above. Most notably, there is severe canal stenosis at L4-L5 with crowding of the cauda equina.   Electronically Signed   By: Macy Mis M.D.   On: 04/30/2019 19:16   She reports that she has never smoked. She has never used smokeless tobacco. No results for input(s): HGBA1C, LABURIC in the last 8760 hours.  Objective:  VS:  HT:    WT:   BMI:     BP:(!) 156/75  HR:78bpm  TEMP: ( )  RESP:  Physical Exam Vitals and nursing note reviewed.  Constitutional:      General: She is not in acute distress.    Appearance: Normal appearance. She is well-developed. She is not ill-appearing.  HENT:     Head: Normocephalic and atraumatic.  Eyes:     Conjunctiva/sclera: Conjunctivae normal.     Pupils: Pupils are equal, round, and reactive to light.  Cardiovascular:      Rate and Rhythm: Normal rate.     Pulses: Normal pulses.  Pulmonary:     Effort: Pulmonary effort is normal.  Musculoskeletal:        General: Swelling and tenderness present.     Right lower leg: Edema present.     Left lower leg: Edema present.     Comments: Patient has some difficulty going from sit to stand and is using a cane with ambulation.  She does stand and walk with a forward flexed lumbar spine she has pain and stiffness with extension and facet loading.  No pain with hip rotation good distal.  Skin:    General: Skin is warm and dry.     Findings: No erythema or rash.  Neurological:  General: No focal deficit present.     Mental Status: She is alert and oriented to person, place, and time.     Sensory: No sensory deficit.     Motor: No weakness or abnormal muscle tone.     Coordination: Coordination normal.     Gait: Gait abnormal.  Psychiatric:        Mood and Affect: Mood normal.        Behavior: Behavior normal.     Ortho Exam Imaging: XR C-ARM NO REPORT  Result Date: 05/19/2019 Please see Notes tab for imaging impression.   Past Medical/Family/Surgical/Social History: Medications & Allergies reviewed per EMR, new medications updated. Patient Active Problem List   Diagnosis Date Noted  . Chest pain 11/28/2018  . Chest pain on breathing 12/06/2015  . Exertional chest pain   . COUGH 06/24/2009  . HYPOPOTASSEMIA 12/31/2008  . Pure hypercholesterolemia 12/16/2008  . Hyperlipidemia, mixed 08/28/2008  . HYPERTENSION, BENIGN 08/28/2008  . S/P CABG x 3 08/28/2008  . GERD 10/16/2007   Past Medical History:  Diagnosis Date  . Anemia   . Coronary artery disease   . Diabetes mellitus   . DJD (degenerative joint disease)   . Dysrhythmia   . Edema   . GERD (gastroesophageal reflux disease)   . Hiatal hernia   . History of blood transfusion   . Hyperlipidemia   . Hypertension   . Hypothyroidism   . IBS (irritable bowel syndrome)   . SVT  (supraventricular tachycardia) (HCC)    with ablation   Family History  Problem Relation Age of Onset  . Heart attack Mother 33       died  . Heart attack Father 9       died  . Breast cancer Sister   . Diabetes Sister        and brother  . Heart disease Other        sister, father and brother  . Coronary artery disease Brother        PCI   Past Surgical History:  Procedure Laterality Date  . APPENDECTOMY    . BREAST SURGERY     biopsy per left breast   . CARDIAC CATHETERIZATION    . CHOLECYSTECTOMY  2005  . CORONARY ARTERY BYPASS GRAFT  2000  . EYE SURGERY     cataract surgery bilat   . REPLACEMENT TOTAL KNEE  2009   left   Social History   Occupational History  . Occupation: retired    Associate Professor: RETIRED  Tobacco Use  . Smoking status: Never Smoker  . Smokeless tobacco: Never Used  Substance and Sexual Activity  . Alcohol use: No  . Drug use: No  . Sexual activity: Not on file

## 2019-05-26 NOTE — Progress Notes (Signed)
Cardiology Office Note:    Date:  05/27/2019   ID:  [name], DOB 06-06-1932, MRN [id number]  PCP:  Jarome Matin, MD  Cardiologist:  Dietrich Pates, MD   Electrophysiologist:  None   Referring MD: Jarome Matin, MD   Chief Complaint:  Follow-up (CAD)    Patient Profile:    [name] is a 86 y.o. female with:   Coronary artery disease s/p CABG in 2000  Myoview 11/2018: no ischemia  Echocardiogram 03/2018: EF 55-60, Gr 1 DD  GERD,   Hyperlipidemia   Hypertension   PSVT s/p RFA ablation 2009  Prior CV studies: Event monitor 01/08/2019 NSR, occ PVCs, PAC, Type 1 2nd degree AVB, pause 3 sec (beta-blocker reduced)  Myoview 11/29/2018 No ischemia or scar, EF 70; Low Risk  Echocardiogram 04/05/2018 EF 55-60, mild LVH, Gr 1 DD, mild elevated RVSP, mild LAE, mild MR, mild TR  Myoview 11/2011 EF 68, no ischemia  Cardiac catheterization 09/23/04  L-LAD, S-OM, S-PDA patent Distal RCA 80  History of Present Illness:    [name] was last seen by Dr. Tenny Craw in 01/2019.  She returns for follow-up.  She is here alone.  She has had issues with her back recently and went to the emergency room.  MRI demonstrated multilevel degenerative changes and severe canal stenosis at L4-L5.  She saw orthopedics and had an epidural steroid injection.  She feels her leg symptoms are improving.  She has not had chest discomfort, significant shortness of breath, orthopnea, significant lower extremity swelling or syncope.  Of note, she did ask for a note to take to her landlord so that she could get an apartment on the ground floor.  Since this is specific to her back, have asked her to check with primary care or her orthopedist for such a letter.  Past Medical History:  Diagnosis Date  . Anemia   . Coronary artery disease   . Diabetes mellitus   . DJD (degenerative joint disease)   . Dysrhythmia   . Edema   . GERD (gastroesophageal reflux disease)   . Hiatal hernia   . History  of blood transfusion   . Hyperlipidemia   . Hypertension   . Hypothyroidism   . IBS (irritable bowel syndrome)   . SVT (supraventricular tachycardia) (HCC)    with ablation    Current Medications: Current Meds  Medication Sig  . acetaminophen (TYLENOL) 325 MG tablet Take 2 tablets (650 mg total) by mouth every 6 (six) hours as needed for up to 30 doses for mild pain or moderate pain.  Marland Kitchen amLODipine (NORVASC) 10 MG tablet TAKE 1 TABLET BY MOUTH EVERY DAY  . aspirin 81 MG tablet Take 81 mg by mouth daily.    . calcium carbonate (TUMS - DOSED IN MG ELEMENTAL CALCIUM) 500 MG chewable tablet Chew 1 tablet by mouth as needed for indigestion or heartburn.   . ergocalciferol (VITAMIN D2) 50000 UNITS capsule Take 50,000 Units by mouth once a week.    . esomeprazole (NEXIUM) 20 MG capsule Take 20 mg by mouth daily before breakfast.  . ezetimibe (ZETIA) 10 MG tablet Take 0.5 tablets (5 mg total) by mouth daily.  . furosemide (LASIX) 20 MG tablet TAKE 1 TABLET BY MOUTH EVERY DAY  . KLOR-CON M20 20 MEQ tablet TAKE 1 TABLET BY MOUTH EVERY DAY  . levothyroxine (SYNTHROID, LEVOTHROID) 75 MCG tablet Take 75 mcg by mouth daily.    Marland Kitchen lisinopril (ZESTRIL) 10 MG tablet Take 1  tablet (10 mg total) by mouth daily.  . metoprolol succinate (TOPROL XL) 25 MG 24 hr tablet Take 1 tablet (25 mg total) by mouth daily.  . nitroGLYCERIN (NITROSTAT) 0.4 MG SL tablet Place 1 tablet (0.4 mg total) under the tongue every 5 (five) minutes x 3 doses as needed for chest pain.  . rosuvastatin (CRESTOR) 10 MG tablet Take 1 tablet (10 mg total) by mouth daily.     Allergies:   Actos [pioglitazone hydrochloride], Codeine, and Lipitor [atorvastatin]   Social History   Tobacco Use  . Smoking status: Never Smoker  . Smokeless tobacco: Never Used  Substance Use Topics  . Alcohol use: No  . Drug use: No     Family Hx: The patient's family history includes Breast cancer in her sister; Coronary artery disease in her brother;  Diabetes in her sister; Heart attack (age of onset: 75) in her father; Heart attack (age of onset: 64) in her mother; Heart disease in an other family member.  ROS   EKGs/Labs/Other Test Reviewed:    EKG:  EKG is   ordered today.  The ekg ordered today demonstrates normal sinus rhythm, heart rate 78, left axis deviation, right bundle branch block, QTC 446  Recent Labs: 11/28/2018: TSH 0.869 04/30/2019: ALT 14; BUN 10; Creatinine, Ser 0.86; Hemoglobin 12.8; Platelets 185; Potassium 3.8; Sodium 129   Recent Lipid Panel Lab Results  Component Value Date/Time   CHOL 134 01/08/2019 08:31 AM   TRIG 121 01/08/2019 08:31 AM   HDL 67 01/08/2019 08:31 AM   CHOLHDL 2.0 01/08/2019 08:31 AM   CHOLHDL 2.6 12/07/2015 04:40 AM   LDLCALC 46 01/08/2019 08:31 AM    Physical Exam:    VS:  BP 130/66   Pulse 78   Ht 5\' 3"  (1.6 m)   Wt 143 lb 12.8 oz (65.2 kg)   SpO2 98%   BMI 25.47 kg/m     Wt Readings from Last 3 Encounters:  05/27/19 143 lb 12.8 oz (65.2 kg)  02/06/19 140 lb 12.8 oz (63.9 kg)  11/29/18 142 lb (64.4 kg)     Constitutional:      Appearance: Healthy appearance. Not in distress.  Pulmonary:     Effort: Pulmonary effort is normal.     Breath sounds: No wheezing. No rales.  Cardiovascular:     Normal rate. Regular rhythm. Normal S1. Normal S2.     Murmurs: There is no murmur.  Edema:    Peripheral edema absent.  Abdominal:     Palpations: Abdomen is soft.  Musculoskeletal:     Cervical back: Neck supple. Skin:    General: Skin is warm and dry.  Neurological:     General: No focal deficit present.     Mental Status: Alert and oriented to person, place and time.       ASSESSMENT & PLAN:    1. Coronary artery disease involving native coronary artery of native heart without angina pectoris History of CABG in 2000.  Myoview in October 2020 demonstrated no ischemia.  She is doing well without anginal symptoms.  Continue aspirin, ezetimibe, metoprolol succinate,  rosuvastatin.  Follow-up with Dr. Harrington Challenger in 6 months.  2. Essential hypertension The patient's blood pressure is controlled on her current regimen.  Continue current therapy.   3. Hyperlipidemia, mixed Continue rosuvastatin, ezetimibe.  Arrange fasting lipids and LFTs.   Dispo:  Return in about 6 months (around 11/27/2019) for Routine Follow Up, w/ Dr. Harrington Challenger, in person.   Medication  Adjustments/Labs and Tests Ordered: Current medicines are reviewed at length with the patient today.  Concerns regarding medicines are outlined above.  Tests Ordered: Orders Placed This Encounter  Procedures  . Hepatic function panel  . Lipid panel  . EKG 12-Lead   Medication Changes: No orders of the defined types were placed in this encounter.   Signed, Tereso Newcomer, PA-C  05/27/2019 12:36 PM    [facility] [address] [place], [place], [place]  [address] Phone: [phone]; Fax: [phone]

## 2019-05-27 ENCOUNTER — Encounter: Payer: Self-pay | Admitting: Physician Assistant

## 2019-05-27 ENCOUNTER — Ambulatory Visit: Payer: Medicare Other | Admitting: Physician Assistant

## 2019-05-27 ENCOUNTER — Other Ambulatory Visit: Payer: Self-pay

## 2019-05-27 VITALS — BP 130/66 | HR 78 | Ht 63.0 in | Wt 143.8 lb

## 2019-05-27 DIAGNOSIS — E782 Mixed hyperlipidemia: Secondary | ICD-10-CM | POA: Diagnosis not present

## 2019-05-27 DIAGNOSIS — I2510 Atherosclerotic heart disease of native coronary artery without angina pectoris: Secondary | ICD-10-CM

## 2019-05-27 DIAGNOSIS — I10 Essential (primary) hypertension: Secondary | ICD-10-CM

## 2019-05-27 NOTE — Patient Instructions (Signed)
Medication Instructions:   Your physician recommends that you continue on your current medications as directed. Please refer to the Current Medication list given to you today.  *If you need a refill on your cardiac medications before your next appointment, please call your pharmacy*  Lab Work:  Your physician recommends that you return for lab work in 2 weeks on 06/10/19  If you have labs (blood work) drawn today and your tests are completely normal, you will receive your results only by: Marland Kitchen MyChart Message (if you have MyChart) OR . A paper copy in the mail If you have any lab test that is abnormal or we need to change your treatment, we will call you to review the results.  Testing/Procedures:  None ordered today  Follow-Up: At [facility], you and your health needs are our priority.  As part of our continuing mission to provide you with exceptional heart care, we have created designated Provider Care Teams.  These Care Teams include your primary Cardiologist (physician) and Advanced Practice Providers (APPs -  Physician Assistants and Nurse Practitioners) who all work together to provide you with the care you need, when you need it.  We recommend signing up for the patient portal called "MyChart".  Sign up information is provided on this After Visit Summary.  MyChart is used to connect with patients for Virtual Visits (Telemedicine).  Patients are able to view lab/test results, encounter notes, upcoming appointments, etc.  Non-urgent messages can be sent to your provider as well.   To learn more about what you can do with MyChart, go to [email or web address].    Your next appointment:   6 month(s)  The format for your next appointment:   In Person  Provider:   Dietrich Pates, MD

## 2019-06-10 ENCOUNTER — Other Ambulatory Visit: Payer: Medicare Other

## 2019-06-10 ENCOUNTER — Other Ambulatory Visit: Payer: Self-pay

## 2019-06-10 DIAGNOSIS — I2510 Atherosclerotic heart disease of native coronary artery without angina pectoris: Secondary | ICD-10-CM

## 2019-06-10 DIAGNOSIS — I10 Essential (primary) hypertension: Secondary | ICD-10-CM

## 2019-06-10 DIAGNOSIS — E782 Mixed hyperlipidemia: Secondary | ICD-10-CM

## 2019-06-10 LAB — LIPID PANEL
Chol/HDL Ratio: 1.9 ratio (ref 0.0–4.4)
Cholesterol, Total: 156 mg/dL (ref 100–199)
HDL: 83 mg/dL (ref >39)
LDL Chol Calc (NIH): 54 mg/dL (ref 0–99)
Triglycerides: 110 mg/dL (ref 0–149)
VLDL Cholesterol Cal: 19 mg/dL (ref 5–40)

## 2019-06-10 LAB — HEPATIC FUNCTION PANEL
ALT: 13 IU/L (ref 0–32)
AST: 19 IU/L (ref 0–40)
Albumin: 4.4 g/dL (ref 3.6–4.6)
Alkaline Phosphatase: 64 IU/L (ref 39–117)
Bilirubin Total: 0.8 mg/dL (ref 0.0–1.2)
Bilirubin, Direct: 0.23 mg/dL (ref 0.00–0.40)
Total Protein: 6.9 g/dL (ref 6.0–8.5)

## 2019-06-25 ENCOUNTER — Encounter: Payer: Self-pay | Admitting: Family

## 2019-06-25 ENCOUNTER — Ambulatory Visit: Payer: Self-pay

## 2019-06-25 ENCOUNTER — Other Ambulatory Visit: Payer: Self-pay

## 2019-06-25 ENCOUNTER — Ambulatory Visit: Payer: Medicare Other | Admitting: Family

## 2019-06-25 VITALS — Ht 63.0 in | Wt 143.0 lb

## 2019-06-25 DIAGNOSIS — S22000A Wedge compression fracture of unspecified thoracic vertebra, initial encounter for closed fracture: Secondary | ICD-10-CM | POA: Diagnosis not present

## 2019-06-25 DIAGNOSIS — M546 Pain in thoracic spine: Secondary | ICD-10-CM

## 2019-06-25 MED ORDER — LIDOCAINE 4 % EX PTCH: 1.0000 | MEDICATED_PATCH | Freq: Two times a day (BID) | CUTANEOUS | 1 refills | Status: DC | PRN

## 2019-06-25 MED ORDER — TRAMADOL HCL 50 MG PO TABS: 50.0000 mg | ORAL_TABLET | Freq: Four times a day (QID) | ORAL | 0 refills | Status: DC | PRN

## 2019-06-25 MED ORDER — LIDOCAINE 5 % EX PTCH: 1.0000 | MEDICATED_PATCH | CUTANEOUS | 2 refills | Status: DC

## 2019-06-25 NOTE — Addendum Note (Signed)
Addended by: Barnie Del R on: 06/25/2019 03:02 PM   Modules accepted: Orders

## 2019-06-25 NOTE — Progress Notes (Signed)
Office Visit Note   Patient: [name]           Date of Birth: 09/18/1932           MRN: [id number] Visit Date: 06/25/2019              Requested by: Jarome Matin, MD [address] [place],  [place] [address] PCP: Jarome Matin, MD  Chief Complaint  Patient presents with  . Middle Back - Pain      HPI: The patient is an 86 year old woman who presents today complaining of new upper and mid back pain this began just 2 days ago she woke up with significant sharp pain in her thoracic spine there are 2 or 3 areas that are quite painful for her she is having shooting pain that shoots straight through her back to her abdomen she cannot take a deep breath or cough without having significant pain she was having difficulty sleeping difficulty getting comfortable pain with talking and laughing on exam.  No new radicular symptoms no red flag symptoms  Overall states her lower extremity and low back issues have resolved after lumbar ESI in March.  She states she was unable to get her Prolia injection last month, wonders if is related.  Assessment & Plan: Visit Diagnoses:  1. Pain in thoracic spine   2. Compression fracture of thoracic vertebra, initial encounter, unspecified thoracic vertebral level (HCC)     Plan: compression fractures thoracic spine. Offered pain medication. She is resistant to trying narcotics. Offered tramadol. She will try Lidocaine patches as well.   Patient not interested in any surgical interventions.  Follow-Up Instructions: Return in about 4 weeks (around 07/23/2019), or if symptoms worsen or fail to improve.   Back Exam   Tenderness  The patient is experiencing tenderness in the thoracic.  Muscle Strength  The patient has normal back strength.  Comments:  Painful rom of thoracic spine, some paraspinal tenderness as well.       Patient is alert, oriented, no adenopathy, well-dressed, normal affect, normal respiratory  effort.   Imaging: XR Thoracic Spine 2 View  Result Date: 06/25/2019 Radiographs of thoracic spine show multiple compression fractures. There are widespread degenerative changes with osteophytic spurring. No spondylolisthesis.  No images are attached to the encounter.  Labs: Lab Results  Component Value Date   HGBA1C 5.6 12/07/2015   ESRSEDRATE 2 11/19/2008   REPTSTATUS 05/01/2019 FINAL 04/30/2019   CULT (A) 04/30/2019    <10,000 COLONIES/mL INSIGNIFICANT GROWTH Performed at [facility], [address], [place], [place] [address]      Lab Results  Component Value Date   ALBUMIN 4.4 06/10/2019   ALBUMIN 3.8 04/30/2019   ALBUMIN 3.9 11/28/2018    Lab Results  Component Value Date   MG 1.9 11/19/2008   MG 2.0 03/31/2007   Lab Results  Component Value Date   VD25OH 49 12/11/2008    No results found for: PREALBUMIN CBC EXTENDED Latest Ref Rng & Units 04/30/2019 11/28/2018 03/29/2018  WBC 4.0 - 10.5 K/uL 18.5(H) 7.5 8.3  RBC 3.87 - 5.11 MIL/uL 4.04 3.89 4.43  HGB 12.0 - 15.0 g/dL 41.6 60.6 30.1  HCT 60.1 - 46.0 % 36.5 35.6(L) 39.3  PLT 150 - 400 K/uL 185 147(L) 211  NEUTROABS 1.7 - 7.7 K/uL 15.1(H) 4.4 -  LYMPHSABS 0.7 - 4.0 K/uL 1.0 1.8 -     Body mass index is 25.33 kg/m.  Orders:  Orders Placed This Encounter  Procedures  . XR Thoracic Spine 2 View   Meds ordered this encounter  Medications  . traMADol (ULTRAM) 50 MG tablet    Sig: Take 1 tablet (50 mg total) by mouth every 6 (six) hours as needed.    Dispense:  30 tablet    Refill:  0  . Lidocaine (HM LIDOCAINE PATCH) 4 % PTCH    Sig: Apply 1 patch topically every 12 (twelve) hours as needed.    Dispense:  30 patch    Refill:  1     Procedures: No procedures performed  Clinical Data: No additional findings.  ROS:  All other systems negative, except as noted in the HPI. Review of Systems  Objective: Vital Signs: Ht 5\' 3"  (1.6 m)   Wt 143 lb (64.9 kg)   BMI 25.33 kg/m   Specialty  Comments:  No specialty comments available.  PMFS History: Patient Active Problem List   Diagnosis Date Noted  . Chest pain 11/28/2018  . Chest pain on breathing 12/06/2015  . Exertional chest pain   . COUGH 06/24/2009  . HYPOPOTASSEMIA 12/31/2008  . Pure hypercholesterolemia 12/16/2008  . Hyperlipidemia, mixed 08/28/2008  . HYPERTENSION, BENIGN 08/28/2008  . S/P CABG x 3 08/28/2008  . GERD 10/16/2007   Past Medical History:  Diagnosis Date  . Anemia   . Coronary artery disease   . Diabetes mellitus   . DJD (degenerative joint disease)   . Dysrhythmia   . Edema   . GERD (gastroesophageal reflux disease)   . Hiatal hernia   . History of blood transfusion   . Hyperlipidemia   . Hypertension   . Hypothyroidism   . IBS (irritable bowel syndrome)   . SVT (supraventricular tachycardia) (HCC)    with ablation    Family History  Problem Relation Age of Onset  . Heart attack Mother 28       died  . Heart attack Father 25       died  . Breast cancer Sister   . Diabetes Sister        and brother  . Heart disease Other        sister, father and brother  . Coronary artery disease Brother        PCI    Past Surgical History:  Procedure Laterality Date  . APPENDECTOMY    . BREAST SURGERY     biopsy per left breast   . CARDIAC CATHETERIZATION    . CHOLECYSTECTOMY  2005  . CORONARY ARTERY BYPASS GRAFT  2000  . EYE SURGERY     cataract surgery bilat   . REPLACEMENT TOTAL KNEE  2009   left   Social History   Occupational History  . Occupation: retired    Fish farm manager: RETIRED  Tobacco Use  . Smoking status: Never Smoker  . Smokeless tobacco: Never Used  Substance and Sexual Activity  . Alcohol use: No  . Drug use: No  . Sexual activity: Not on file

## 2019-07-04 ENCOUNTER — Telehealth: Payer: Self-pay

## 2019-07-04 NOTE — Telephone Encounter (Signed)
Called and lm on vm to advise that prior auth has been approved for Lidoderm 5% patch. To call with questions.

## 2019-07-15 ENCOUNTER — Other Ambulatory Visit: Payer: Self-pay | Admitting: Internal Medicine

## 2019-07-15 ENCOUNTER — Encounter (HOSPITAL_COMMUNITY): Payer: Self-pay

## 2019-07-15 ENCOUNTER — Emergency Department (HOSPITAL_COMMUNITY): Payer: Medicare Other

## 2019-07-15 ENCOUNTER — Emergency Department (HOSPITAL_COMMUNITY)
Admission: EM | Admit: 2019-07-15 | Discharge: 2019-07-16 | Disposition: A | Discharge status: Home / Self Care | Payer: Medicare Other | Attending: Emergency Medicine | Admitting: Emergency Medicine

## 2019-07-15 DIAGNOSIS — R072 Precordial pain: Secondary | ICD-10-CM | POA: Diagnosis not present

## 2019-07-15 DIAGNOSIS — E039 Hypothyroidism, unspecified: Secondary | ICD-10-CM | POA: Diagnosis not present

## 2019-07-15 DIAGNOSIS — I2510 Atherosclerotic heart disease of native coronary artery without angina pectoris: Secondary | ICD-10-CM | POA: Diagnosis not present

## 2019-07-15 DIAGNOSIS — Z7982 Long term (current) use of aspirin: Secondary | ICD-10-CM | POA: Insufficient documentation

## 2019-07-15 DIAGNOSIS — R0789 Other chest pain: Secondary | ICD-10-CM | POA: Diagnosis present

## 2019-07-15 DIAGNOSIS — Z951 Presence of aortocoronary bypass graft: Secondary | ICD-10-CM | POA: Insufficient documentation

## 2019-07-15 DIAGNOSIS — E119 Type 2 diabetes mellitus without complications: Secondary | ICD-10-CM | POA: Insufficient documentation

## 2019-07-15 DIAGNOSIS — I10 Essential (primary) hypertension: Secondary | ICD-10-CM | POA: Insufficient documentation

## 2019-07-15 DIAGNOSIS — Z79899 Other long term (current) drug therapy: Secondary | ICD-10-CM | POA: Diagnosis not present

## 2019-07-15 LAB — BASIC METABOLIC PANEL
Anion gap: 9 (ref 5–15)
BUN: 11 mg/dL (ref 8–23)
CO2: 27 mmol/L (ref 22–32)
Calcium: 9.4 mg/dL (ref 8.9–10.3)
Chloride: 99 mmol/L (ref 98–111)
Creatinine, Ser: 0.86 mg/dL (ref 0.44–1.00)
GFR calc Af Amer: >60 mL/min (ref >60)
GFR calc non Af Amer: >60 mL/min (ref >60)
Glucose, Bld: 144 mg/dL — ABNORMAL HIGH (ref 70–99)
Potassium: 3.8 mmol/L (ref 3.5–5.1)
Sodium: 135 mmol/L (ref 135–145)

## 2019-07-15 LAB — CBC
HCT: 37.2 % (ref 36.0–46.0)
Hemoglobin: 13 g/dL (ref 12.0–15.0)
MCH: 32.5 pg (ref 26.0–34.0)
MCHC: 34.9 g/dL (ref 30.0–36.0)
MCV: 93 fL (ref 80.0–100.0)
Platelets: 189 10*3/uL (ref 150–400)
RBC: 4 MIL/uL (ref 3.87–5.11)
RDW: 13.2 % (ref 11.5–15.5)
WBC: 6.6 10*3/uL (ref 4.0–10.5)
nRBC: 0 % (ref 0.0–0.2)

## 2019-07-15 LAB — TROPONIN I (HIGH SENSITIVITY): Troponin I (High Sensitivity): 7 ng/L (ref <18)

## 2019-07-15 MED ORDER — SODIUM CHLORIDE 0.9% FLUSH: 3.0000 mL | Freq: Once | INTRAVENOUS | Status: DC

## 2019-07-15 NOTE — ED Triage Notes (Signed)
Pt arrives to ED w/ c/o chest pain that started after eating pineapple. Pt endorses cardiac hx. Pt took 324mg  aspirin, currently chest pain free. Denies sob, n/v.

## 2019-07-16 LAB — TROPONIN I (HIGH SENSITIVITY): Troponin I (High Sensitivity): 8 ng/L (ref <18)

## 2019-07-16 NOTE — ED Provider Notes (Signed)
[facility] EMERGENCY DEPARTMENT Provider Note   CSN: [id number] Arrival date & time: 07/15/19  1953     History Chief Complaint  Patient presents with  . Chest Pain    [name] is a 86 y.o. female.  HPI Patient reports that she developed chest pain yesterday. Chest pain was across her anterior chest and to the right. She reports it was very painful for a couple of minutes. It started to resolve. Patient reports she had eaten a piece of chocolate and some pineapple. Now after thinking about, she believes as what triggered it. She reports she did pass some gas right around the time of the pain and things started to ease at that time. She reports she does have prior history of reflux and now believes it was an episode of reflux. She denies she had any associated shortness of breath or nausea or sweating. Patient has waited the entire night in the waiting room of the emergency department. She reports she did not develop any recurrence of pain once it resolved. Patient has not had any recent fever chills or cough. She does not have any abdominal pain. No recent vomiting or diarrhea. Patient has had her Covid vaccinations.    Past Medical History:  Diagnosis Date  . Anemia   . Coronary artery disease   . Diabetes mellitus   . DJD (degenerative joint disease)   . Dysrhythmia   . Edema   . GERD (gastroesophageal reflux disease)   . Hiatal hernia   . History of blood transfusion   . Hyperlipidemia   . Hypertension   . Hypothyroidism   . IBS (irritable bowel syndrome)   . SVT (supraventricular tachycardia) (HCC)    with ablation    Patient Active Problem List   Diagnosis Date Noted  . Chest pain 11/28/2018  . Chest pain on breathing 12/06/2015  . Exertional chest pain   . COUGH 06/24/2009  . HYPOPOTASSEMIA 12/31/2008  . Pure hypercholesterolemia 12/16/2008  . Hyperlipidemia, mixed 08/28/2008  . HYPERTENSION, BENIGN 08/28/2008  . S/P CABG x 3 08/28/2008   . GERD 10/16/2007    Past Surgical History:  Procedure Laterality Date  . APPENDECTOMY    . BREAST SURGERY     biopsy per left breast   . CARDIAC CATHETERIZATION    . CHOLECYSTECTOMY  2005  . CORONARY ARTERY BYPASS GRAFT  2000  . EYE SURGERY     cataract surgery bilat   . REPLACEMENT TOTAL KNEE  2009   left     OB History   No obstetric history on file.     Family History  Problem Relation Age of Onset  . Heart attack Mother 74       died  . Heart attack Father 89       died  . Breast cancer Sister   . Diabetes Sister        and brother  . Heart disease Other        sister, father and brother  . Coronary artery disease Brother        PCI    Social History   Tobacco Use  . Smoking status: Never Smoker  . Smokeless tobacco: Never Used  Substance Use Topics  . Alcohol use: No  . Drug use: No    Home Medications Prior to Admission medications   Medication Sig Start Date End Date Taking? Authorizing Provider  acetaminophen (TYLENOL) 325 MG tablet Take 2 tablets (650 mg total)  by mouth every 6 (six) hours as needed for up to 30 doses for mild pain or moderate pain. 04/30/19   Wyvonnia Dusky, MD  amLODipine (NORVASC) 10 MG tablet TAKE 1 TABLET BY MOUTH EVERY DAY 04/09/19   Fay Records, MD  aspirin 81 MG tablet Take 81 mg by mouth daily.      [provider]  calcium carbonate (TUMS - DOSED IN MG ELEMENTAL CALCIUM) 500 MG chewable tablet Chew 1 tablet by mouth as needed for indigestion or heartburn.     [provider]  ergocalciferol (VITAMIN D2) 50000 UNITS capsule Take 50,000 Units by mouth once a week.      [provider]  esomeprazole (NEXIUM) 20 MG capsule Take 20 mg by mouth daily before breakfast.    [provider]  ezetimibe (ZETIA) 10 MG tablet TAKE 1/2 TABLET BY MOUTH DAILY 07/15/19   Fay Records, MD  furosemide (LASIX) 20 MG tablet TAKE 1 TABLET BY MOUTH EVERY DAY 05/01/19   Fay Records, MD  KLOR-CON M20 20 MEQ  tablet TAKE 1 TABLET BY MOUTH EVERY DAY 04/28/19   Fay Records, MD  levothyroxine (SYNTHROID, LEVOTHROID) 75 MCG tablet Take 75 mcg by mouth daily.      [provider]  lidocaine (LIDODERM) 5 % Place 1 patch onto the skin daily. Remove & Discard patch within 12 hours or as directed by MD 06/25/19   Suzan Slick, NP  lisinopril (ZESTRIL) 10 MG tablet Take 1 tablet (10 mg total) by mouth daily. 02/06/19   Fay Records, MD  metoprolol succinate (TOPROL XL) 25 MG 24 hr tablet Take 1 tablet (25 mg total) by mouth daily. 02/06/19   Fay Records, MD  nitroGLYCERIN (NITROSTAT) 0.4 MG SL tablet Place 1 tablet (0.4 mg total) under the tongue every 5 (five) minutes x 3 doses as needed for chest pain. 12/07/15   Strader, Fransisco Hertz, PA-C  ONETOUCH VERIO test strip CHECK BLOOD SUGARS ONCE DAILY 05/06/19   [provider]  rosuvastatin (CRESTOR) 10 MG tablet Take 1 tablet (10 mg total) by mouth daily. 02/06/19   Fay Records, MD  traMADol (ULTRAM) 50 MG tablet Take 1 tablet (50 mg total) by mouth every 6 (six) hours as needed. 06/25/19   Suzan Slick, NP    Allergies    Actos [pioglitazone hydrochloride], Codeine, and Lipitor [atorvastatin]  Review of Systems   Review of Systems 10 systems reviewed and negative except as per HPI. Physical Exam Updated Vital Signs BP (!) 165/61   Pulse 85   Temp 98 F (36.7 C)   Resp 14   Ht 5\' 3"  (1.6 m)   Wt 64.8 kg   SpO2 98%   BMI 25.31 kg/m   Physical Exam Constitutional:      Appearance: She is well-developed.     Comments: Patient is well in appearance. Nontoxic. No respiratory distress.  HENT:     Head: Normocephalic and atraumatic.  Eyes:     Extraocular Movements: Extraocular movements intact.  Cardiovascular:     Rate and Rhythm: Normal rate and regular rhythm.     Heart sounds: Normal heart sounds.  Pulmonary:     Effort: Pulmonary effort is normal.     Breath sounds: Normal breath sounds.  Abdominal:     General: Bowel  sounds are normal. There is no distension.     Palpations: Abdomen is soft.     Tenderness: There is no  abdominal tenderness.     Comments: No epigastric tenderness, no right upper quadrant tenderness. Abdomen nontender complete exam.  Musculoskeletal:        General: Normal range of motion.     Cervical back: Neck supple.     Comments: No calf tenderness. Patient has nonpitting swelling of both ankles. Patient reports this is chronic in nature. She elevates and wears compression hose.  Skin:    General: Skin is warm and dry.  Neurological:     General: No focal deficit present.     Mental Status: She is alert and oriented to person, place, and time.     GCS: GCS eye subscore is 4. GCS verbal subscore is 5. GCS motor subscore is 6.     Coordination: Coordination normal.  Psychiatric:        Mood and Affect: Mood normal.     ED Results / Procedures / Treatments   Labs (all labs ordered are listed, but only abnormal results are displayed) Labs Reviewed  BASIC METABOLIC PANEL - Abnormal; Notable for the following components:      Result Value   Glucose, Bld 144 (*)    All other components within normal limits  CBC  TROPONIN I (HIGH SENSITIVITY)  TROPONIN I (HIGH SENSITIVITY)    EKG EKG Interpretation  Date/Time:  Tuesday Jul 15 2019 20:05:26 EDT Ventricular Rate:  77 PR Interval:  310 QRS Duration: 86 QT Interval:  382 QTC Calculation: 432 R Axis:   -2 Text Interpretation: Sinus rhythm with 1st degree A-V block Nonspecific ST and T wave abnormality Abnormal ECG no change from previous Confirmed by Arby Barrette [phone]) on 07/16/2019 7:48:36 AM   Radiology DG Chest 2 View  Result Date: 07/15/2019 CLINICAL DATA:  86 year old female with chest pain for 1 day. EXAM: CHEST - 2 VIEW COMPARISON:  Chest radiographs 11/28/2018 and earlier. FINDINGS: Chronic cardiomegaly, tortuosity of the thoracic aorta with calcified atherosclerosis. Prior CABG. Stable cardiac size and  mediastinal contours. Stable lung volumes. No pneumothorax, pulmonary edema, pleural effusion or confluent pulmonary opacity. A midthoracic compression fracture is new since October. Other visible osseous structures appear stable. Stable cholecystectomy clips. Negative visible bowel gas pattern. IMPRESSION: 1. No acute cardiopulmonary abnormality. Chronic cardiomegaly and Aortic Atherosclerosis (ICD10-I70.0). 2. Midthoracic compression fracture is new since October. If there is midthoracic back pain and specific therapy such as vertebroplasty is desired, noncontrast Thoracic MRI or Nuclear Medicine Whole-body Bone Scan would best determine acuity. Electronically Signed   By: Odessa Fleming M.D.   On: 07/15/2019 20:59    Procedures Procedures (including critical care time)  Medications Ordered in ED Medications  sodium chloride flush (NS) 0.9 % injection 3 mL (has no administration in time range)    ED Course  I have reviewed the triage vital signs and the nursing notes.  Pertinent labs & imaging results that were available during my care of the patient were reviewed by me and considered in my medical decision making (see chart for details).    MDM Rules/Calculators/A&P                     Patient is clinically well in appearance. She had a episode of chest pain yesterday. She has been in the waiting room for 11 hours. No recurrence of pain since resolution. She did not have associated ischemic symptoms. 2 sets of enzymes are negative and chest x-ray without acute findings. Review of systems is negative. Patient suspects this was an episode  of reflux. At this time stable for discharge. She will continue her Nexium and regular medications. She reports she is due for her morning blood pressure medications at 8 AM. She will be discharged to take medications at home. Return precautions reviewed. Final Clinical Impression(s) / ED Diagnoses Final diagnoses:  Precordial chest pain    Rx / DC Orders ED  Discharge Orders    None       Arby Barrette, MD 07/16/19 [phone]

## 2019-07-16 NOTE — Discharge Instructions (Addendum)
1. Take your Nexium daily. 2. Continue your regular medications. 3. See your doctor for recheck within the next 2 to 5 days. 4. Return to the emergency department develop concerning symptoms such as shortness of breath, nausea, weakness and chest pain.

## 2019-07-23 ENCOUNTER — Other Ambulatory Visit: Payer: Self-pay

## 2019-07-23 ENCOUNTER — Ambulatory Visit: Payer: Self-pay

## 2019-07-23 ENCOUNTER — Ambulatory Visit (INDEPENDENT_AMBULATORY_CARE_PROVIDER_SITE_OTHER): Payer: Medicare Other | Admitting: Family

## 2019-07-23 ENCOUNTER — Encounter: Payer: Self-pay | Admitting: Family

## 2019-07-23 VITALS — Ht 63.0 in | Wt 142.0 lb

## 2019-07-23 DIAGNOSIS — S22000A Wedge compression fracture of unspecified thoracic vertebra, initial encounter for closed fracture: Secondary | ICD-10-CM

## 2019-07-23 DIAGNOSIS — M546 Pain in thoracic spine: Secondary | ICD-10-CM

## 2019-07-23 MED ORDER — METHOCARBAMOL 500 MG PO TABS: 500.0000 mg | ORAL_TABLET | Freq: Three times a day (TID) | ORAL | 0 refills | Status: DC

## 2019-07-23 NOTE — Progress Notes (Signed)
Office Visit Note   Patient: [name]           Date of Birth: 12/09/1932           MRN: [id number] Visit Date: 07/23/2019              Requested by: Leanna Battles, MD [place],  [place] [address] PCP: Leanna Battles, MD  No chief complaint on file.     HPI: The patient is an 86 year old woman who presents in follow-up for thoracic spine compression fractures. Its been about 4 weeks since the initial onset of pain and fracture. She has been using a lidocaine patch with great relief. She states she has been taking 650 of Tylenol 2-3 times a day the combination of these is adequate for pain relief. She does note she has had some spasming muscle pain which she had had a muscle relaxer. Overall doing much better she has been able to get back to most of her activities initially she states she had to sleep sitting up in her recliner but has been able to return to her bed. She is not having any new red flag symptoms no new numbness or tingling no weakness   Assessment & Plan: Visit Diagnoses:  1. Pain in thoracic spine   2. Compression fracture of thoracic vertebra, initial encounter, unspecified thoracic vertebral level (HCC)     Plan: Reduced pain from her compression fractures thoracic spine. Will send muscle relaxer. She will follow-up in 4 weeks on an as-needed basis.   Follow-Up Instructions: No follow-ups on file.   Back Exam   Tenderness  The patient is experiencing tenderness in the thoracic.  Muscle Strength  The patient has normal back strength.  Comments:  Mild paraspinal tenderness       Patient is alert, oriented, no adenopathy, well-dressed, normal affect, normal respiratory effort.   Imaging: No results found. No images are attached to the encounter.  Labs: Lab Results  Component Value Date   HGBA1C 5.6 12/07/2015   ESRSEDRATE 2 11/19/2008   REPTSTATUS 05/01/2019 FINAL 04/30/2019   CULT (A) 04/30/2019    <10,000 COLONIES/mL  INSIGNIFICANT GROWTH Performed at [facility], [place] [address] [place], [place] [address]      Lab Results  Component Value Date   ALBUMIN 4.4 06/10/2019   ALBUMIN 3.8 04/30/2019   ALBUMIN 3.9 11/28/2018    Lab Results  Component Value Date   MG 1.9 11/19/2008   MG 2.0 03/31/2007   Lab Results  Component Value Date   VD25OH 49 12/11/2008    No results found for: PREALBUMIN CBC EXTENDED Latest Ref Rng & Units 07/15/2019 04/30/2019 11/28/2018  WBC 4.0 - 10.5 K/uL 6.6 18.5(H) 7.5  RBC 3.87 - 5.11 MIL/uL 4.00 4.04 3.89  HGB 12.0 - 15.0 g/dL 13.0 12.8 13.0  HCT 36.0 - 46.0 % 37.2 36.5 35.6(L)  PLT 150 - 400 K/uL 189 185 147(L)  NEUTROABS 1.7 - 7.7 K/uL - 15.1(H) 4.4  LYMPHSABS 0.7 - 4.0 K/uL - 1.0 1.8     There is no height or weight on file to calculate BMI.  Orders:  Orders Placed This Encounter  Procedures  . XR Thoracic Spine 2 View   No orders of the defined types were placed in this encounter.    Procedures: No procedures performed  Clinical Data: No additional findings.  ROS:  All other systems negative, except as noted in the HPI. Review of Systems  Objective: Vital  Signs: There were no vitals taken for this visit.  Specialty Comments:  No specialty comments available.  PMFS History: Patient Active Problem List   Diagnosis Date Noted  . Chest pain 11/28/2018  . Chest pain on breathing 12/06/2015  . Exertional chest pain   . COUGH 06/24/2009  . HYPOPOTASSEMIA 12/31/2008  . Pure hypercholesterolemia 12/16/2008  . Hyperlipidemia, mixed 08/28/2008  . HYPERTENSION, BENIGN 08/28/2008  . S/P CABG x 3 08/28/2008  . GERD 10/16/2007   Past Medical History:  Diagnosis Date  . Anemia   . Coronary artery disease   . Diabetes mellitus   . DJD (degenerative joint disease)   . Dysrhythmia   . Edema   . GERD (gastroesophageal reflux disease)   . Hiatal hernia   . History of blood transfusion   . Hyperlipidemia   . Hypertension   .  Hypothyroidism   . IBS (irritable bowel syndrome)   . SVT (supraventricular tachycardia) (HCC)    with ablation    Family History  Problem Relation Age of Onset  . Heart attack Mother 76       died  . Heart attack Father 16       died  . Breast cancer Sister   . Diabetes Sister        and brother  . Heart disease Other        sister, father and brother  . Coronary artery disease Brother        PCI    Past Surgical History:  Procedure Laterality Date  . APPENDECTOMY    . BREAST SURGERY     biopsy per left breast   . CARDIAC CATHETERIZATION    . CHOLECYSTECTOMY  2005  . CORONARY ARTERY BYPASS GRAFT  2000  . EYE SURGERY     cataract surgery bilat   . REPLACEMENT TOTAL KNEE  2009   left   Social History   Occupational History  . Occupation: retired    Associate Professor: RETIRED  Tobacco Use  . Smoking status: Never Smoker  . Smokeless tobacco: Never Used  Substance and Sexual Activity  . Alcohol use: No  . Drug use: No  . Sexual activity: Not on file

## 2019-10-23 ENCOUNTER — Encounter: Payer: Self-pay | Admitting: Orthopedic Surgery

## 2019-10-23 ENCOUNTER — Ambulatory Visit: Payer: Medicare Other | Admitting: Orthopedic Surgery

## 2019-10-23 VITALS — Ht 63.0 in | Wt 142.0 lb

## 2019-10-23 DIAGNOSIS — M48061 Spinal stenosis, lumbar region without neurogenic claudication: Secondary | ICD-10-CM | POA: Diagnosis not present

## 2019-10-23 MED ORDER — PREDNISONE 10 MG PO TABS: 10.0000 mg | ORAL_TABLET | Freq: Every day | ORAL | 0 refills | Status: DC

## 2019-10-23 NOTE — Progress Notes (Signed)
Office Visit Note   Patient: [name]           Date of Birth: 05-02-1932           MRN: [id number] Visit Date: 10/23/2019              Requested by: Jarome Matin, MD [address] [place],  [place] [address] PCP: Jarome Matin, MD  Chief Complaint  Patient presents with  . Lower Back - Pain      HPI: Patient is an 86 year old woman who presents with recurrent lower back pain.  She states she has had epidural injections with Dr. Alvester Morin that have provided her good relief.  She states she has been moving and increase her activities and this is caused increased pain she ambulates with a cane.  She has had a thoracic spine compression fracture in the past as well she denies any radicular symptoms.  Patient also states she has pain with lifting sleeping and walking.  Assessment & Plan: Visit Diagnoses:  1. Spinal stenosis of lumbar region, unspecified whether neurogenic claudication present     Plan: Patient will start with 10 mg of prednisone with breakfast to help with her symptoms she will wean off the prednisone as she feels comfortable.  We will have Dr. Hamilton Blas scheduler call her for an appointment for evaluation for epidural injection.  Follow-Up Instructions: Return if symptoms worsen or fail to improve.   Ortho Exam  Patient is alert, oriented, no adenopathy, well-dressed, normal affect, normal respiratory effort. Examination patient ambulates with a cane slowly.  She has negative straight leg raise bilaterally no focal motor weakness in either lower extremity her pain is focused in the lower lumbar spine without radicular symptoms.  Imaging: No results found. No images are attached to the encounter.  Labs: Lab Results  Component Value Date   HGBA1C 5.6 12/07/2015   ESRSEDRATE 2 11/19/2008   REPTSTATUS 05/01/2019 FINAL 04/30/2019   CULT (A) 04/30/2019    <10,000 COLONIES/mL INSIGNIFICANT GROWTH Performed at [facility], [address],  [place], [place] [address]      Lab Results  Component Value Date   ALBUMIN 4.4 06/10/2019   ALBUMIN 3.8 04/30/2019   ALBUMIN 3.9 11/28/2018    Lab Results  Component Value Date   MG 1.9 11/19/2008   MG 2.0 03/31/2007   Lab Results  Component Value Date   VD25OH 49 12/11/2008    No results found for: PREALBUMIN CBC EXTENDED Latest Ref Rng & Units 07/15/2019 04/30/2019 11/28/2018  WBC 4.0 - 10.5 K/uL 6.6 18.5(H) 7.5  RBC 3.87 - 5.11 MIL/uL 4.00 4.04 3.89  HGB 12.0 - 15.0 g/dL 61.9 50.9 32.6  HCT 36 - 46 % 37.2 36.5 35.6(L)  PLT 150 - 400 K/uL 189 185 147(L)  NEUTROABS 1.7 - 7.7 K/uL - 15.1(H) 4.4  LYMPHSABS 0.7 - 4.0 K/uL - 1.0 1.8     Body mass index is 25.15 kg/m.  Orders:  No orders of the defined types were placed in this encounter.  No orders of the defined types were placed in this encounter.    Procedures: No procedures performed  Clinical Data: No additional findings.  ROS:  All other systems negative, except as noted in the HPI. Review of Systems  Objective: Vital Signs: Ht 5\' 3"  (1.6 m)   Wt 142 lb (64.4 kg)   BMI 25.15 kg/m   Specialty Comments:  No specialty comments available.  PMFS History: Patient Active Problem List  Diagnosis Date Noted  . Chest pain 11/28/2018  . Chest pain on breathing 12/06/2015  . Exertional chest pain   . COUGH 06/24/2009  . HYPOPOTASSEMIA 12/31/2008  . Pure hypercholesterolemia 12/16/2008  . Hyperlipidemia, mixed 08/28/2008  . HYPERTENSION, BENIGN 08/28/2008  . S/P CABG x 3 08/28/2008  . GERD 10/16/2007   Past Medical History:  Diagnosis Date  . Anemia   . Coronary artery disease   . Diabetes mellitus   . DJD (degenerative joint disease)   . Dysrhythmia   . Edema   . GERD (gastroesophageal reflux disease)   . Hiatal hernia   . History of blood transfusion   . Hyperlipidemia   . Hypertension   . Hypothyroidism   . IBS (irritable bowel syndrome)   . SVT (supraventricular tachycardia) (HCC)    with  ablation    Family History  Problem Relation Age of Onset  . Heart attack Mother 32       died  . Heart attack Father 52       died  . Breast cancer Sister   . Diabetes Sister        and brother  . Heart disease Other        sister, father and brother  . Coronary artery disease Brother        PCI    Past Surgical History:  Procedure Laterality Date  . APPENDECTOMY    . BREAST SURGERY     biopsy per left breast   . CARDIAC CATHETERIZATION    . CHOLECYSTECTOMY  2005  . CORONARY ARTERY BYPASS GRAFT  2000  . EYE SURGERY     cataract surgery bilat   . REPLACEMENT TOTAL KNEE  2009   left   Social History   Occupational History  . Occupation: retired    Associate Professor: RETIRED  Tobacco Use  . Smoking status: Never Smoker  . Smokeless tobacco: Never Used  Substance and Sexual Activity  . Alcohol use: No  . Drug use: No  . Sexual activity: Not on file

## 2019-10-24 ENCOUNTER — Telehealth: Payer: Self-pay | Admitting: Physical Medicine and Rehabilitation

## 2019-10-24 NOTE — Telephone Encounter (Signed)
Patient returned call asked for a call back to schedule an appointment    [phone]

## 2019-10-27 ENCOUNTER — Other Ambulatory Visit: Payer: Self-pay | Admitting: Family

## 2019-10-27 NOTE — Telephone Encounter (Signed)
Do you want to fill this medication?

## 2019-10-27 NOTE — Telephone Encounter (Signed)
Documented in referral  

## 2019-11-11 ENCOUNTER — Other Ambulatory Visit: Payer: Self-pay

## 2019-11-11 ENCOUNTER — Encounter: Payer: Self-pay | Admitting: Physical Medicine and Rehabilitation

## 2019-11-11 ENCOUNTER — Ambulatory Visit: Payer: Self-pay

## 2019-11-11 ENCOUNTER — Ambulatory Visit (INDEPENDENT_AMBULATORY_CARE_PROVIDER_SITE_OTHER): Payer: Medicare Other | Admitting: Physical Medicine and Rehabilitation

## 2019-11-11 VITALS — BP 164/87 | HR 92

## 2019-11-11 DIAGNOSIS — M5416 Radiculopathy, lumbar region: Secondary | ICD-10-CM | POA: Diagnosis not present

## 2019-11-11 DIAGNOSIS — M48062 Spinal stenosis, lumbar region with neurogenic claudication: Secondary | ICD-10-CM | POA: Diagnosis not present

## 2019-11-11 MED ORDER — METHYLPREDNISOLONE ACETATE 80 MG/ML IJ SUSP
80.0000 mg | Freq: Once | INTRAMUSCULAR | Status: AC
  Administered 2019-11-11: 80 mg

## 2019-11-11 NOTE — Progress Notes (Signed)
   Numeric Pain Rating Scale and Functional Assessment Average Pain 10   In the last MONTH (on 0-10 scale) has pain interfered with the following?  1. General activity like being  able to carry out your everyday physical activities such as walking, climbing stairs, carrying groceries, or moving a chair?  Rating(8)   +Driver, -BT, -Dye Allergies.  

## 2019-11-12 ENCOUNTER — Telehealth: Payer: Self-pay

## 2019-11-12 NOTE — Telephone Encounter (Signed)
I called pt and advised that I will enter order through [employer] for rolling walker with seat and hold this message pending approval.

## 2019-11-12 NOTE — Telephone Encounter (Signed)
-----   Message from Adonis Huguenin, NP sent at 11/12/2019  3:01 PM EDT ----- Regarding: RE: Rollator and prednisone She can just stop the prednisone when she feels "ready"  Looks like she had esi yesterday. If has had relief can just stop, does not need to wean ----- Message ----- From: Rodena Medin, RMA Sent: 11/12/2019   2:34 PM EDT To: Adonis Huguenin, NP Subject: FW: Rollator and prednisone                    Can you please advise instructions to wean of pred? I will call about the rolling walker.  ----- Message ----- From: Cindie Crumbly, RT Sent: 11/11/2019   4:35 PM EDT To: Rodena Medin, RMA Subject: FW: Rollator and prednisone                    Please advise. ----- Message ----- From: Tyrell Antonio, MD Sent: 11/11/2019   3:11 PM EDT To: Cindie Crumbly, RT Subject: Rollator and prednisone                        Can you send message to Monzerat Handler that patient would like a call about how to get rx for help with rolling walker. She also needs to know plan for her prednisone - she asked about stopping or weaning. She may need OV with Denny Peon

## 2019-11-13 NOTE — Telephone Encounter (Signed)
Note in [employer] shows scheduled for delivery.

## 2019-11-19 ENCOUNTER — Other Ambulatory Visit: Payer: Self-pay | Admitting: Orthopedic Surgery

## 2019-11-23 NOTE — Procedures (Signed)
Lumbosacral Transforaminal Epidural Steroid Injection - Sub-Pedicular Approach with Fluoroscopic Guidance  Patient: [name]      Date of Birth: August 15, 1932 MRN: [id number] PCP: Jarome Matin, MD      Visit Date: 11/11/2019   Universal Protocol:    Date/Time: 11/11/2019  Consent Given By: the patient  Position: PRONE  Additional Comments: Vital signs were monitored before and after the procedure. Patient was prepped and draped in the usual sterile fashion. The correct patient, procedure, and site was verified.   Injection Procedure Details:  Procedure Site One Meds Administered:  Meds ordered this encounter  Medications  . methylPREDNISolone acetate (DEPO-MEDROL) injection 80 mg    Laterality: Bilateral  Location/Site:  L4-L5  Needle size: 22 G  Needle type: Spinal  Needle Placement: Transforaminal  Findings:    -Comments: Excellent flow of contrast along the nerve, nerve root and into the epidural space.  Procedure Details: After squaring off the end-plates to get a true AP view, the C-arm was positioned so that an oblique view of the foramen as noted above was visualized. The target area is just inferior to the "nose of the scotty dog" or sub pedicular. The soft tissues overlying this structure were infiltrated with 2-3 ml. of 1% Lidocaine without Epinephrine.  The spinal needle was inserted toward the target using a "trajectory" view along the fluoroscope beam.  Under AP and lateral visualization, the needle was advanced so it did not puncture dura and was located close the 6 O'Clock position of the pedical in AP tracterory. Biplanar projections were used to confirm position. Aspiration was confirmed to be negative for CSF and/or blood. A 1-2 ml. volume of Isovue-250 was injected and flow of contrast was noted at each level. Radiographs were obtained for documentation purposes.   After attaining the desired flow of contrast documented above, a 0.5 to 1.0 ml  test dose of 0.25% Marcaine was injected into each respective transforaminal space.  The patient was observed for 90 seconds post injection.  After no sensory deficits were reported, and normal lower extremity motor function was noted,   the above injectate was administered so that equal amounts of the injectate were placed at each foramen (level) into the transforaminal epidural space.   Additional Comments:  Right-sided injection without complication left-sided injection with delivery of injectate did cause significant discomfort but patient was doing well at time of discharge. Dressing: 2 x 2 sterile gauze and Band-Aid    Post-procedure details: Patient was observed during the procedure. Post-procedure instructions were reviewed.  Patient left the clinic in stable condition.

## 2019-11-23 NOTE — Progress Notes (Signed)
.    Cardiology Office Note   Date:  11/24/2019   ID:  [name], DOB 20-Jul-1932, MRN [id number]  PCP:  Jarome Matin, MD  Cardiologist:   Dietrich Pates, MD    F/U of CAD     History of Present Illness: [name] is a 86 y.o. female with a history of CAD (s/p CABG in 2000.  Myoview in 2013 was normal.   She was admitted to [facility] in October 2016 Complained of CP and    Myoview scan was normal   Echo in 2020  Normal LV function  I aw the pt in June 2020  She was admitted in October with chest tightness, SOB she said the episodes felt like a panic attack with her heart racing.  Did not know what to do so went to the emergency room.  Myovue done which was neg for ischemia    Pt complained of leg pain  Stopped lipitor and zetiz in Nov 2020.  Started Crestor   Toleraitig   I saw the pt in Dec 2020   She was seen by Wende Mott in March 2021  The pt denies CP   Does note occaional gas  No SOB  No dizzienss Outpatient Medications Prior to Visit  Medication Sig Dispense Refill  . acetaminophen (TYLENOL) 325 MG tablet Take 2 tablets (650 mg total) by mouth every 6 (six) hours as needed for up to 30 doses for mild pain or moderate pain. 30 tablet 0  . amLODipine (NORVASC) 10 MG tablet TAKE 1 TABLET BY MOUTH EVERY DAY 90 tablet 3  . aspirin 81 MG tablet Take 81 mg by mouth daily.      . calcium carbonate (TUMS - DOSED IN MG ELEMENTAL CALCIUM) 500 MG chewable tablet Chew 1 tablet by mouth as needed for indigestion or heartburn.     . ergocalciferol (VITAMIN D2) 50000 UNITS capsule Take 50,000 Units by mouth once a week.      . esomeprazole (NEXIUM) 20 MG capsule Take 20 mg by mouth daily before breakfast.    . ezetimibe (ZETIA) 10 MG tablet TAKE 1/2 TABLET BY MOUTH DAILY 45 tablet 2  . furosemide (LASIX) 20 MG tablet TAKE 1 TABLET BY MOUTH EVERY DAY 90 tablet 2  . KLOR-CON M20 20 MEQ tablet TAKE 1 TABLET BY MOUTH EVERY DAY 90 tablet 2  . levothyroxine (SYNTHROID, LEVOTHROID) 75 MCG  tablet Take 75 mcg by mouth daily.      Marland Kitchen lidocaine (LIDODERM) 5 % PLACE 1 PATCH ONTO THE SKIN DAILY. REMOVE & DISCARD PATCH WITHIN 12 HOURS OR AS DIRECTED BY MD 30 patch 2  . lisinopril (ZESTRIL) 10 MG tablet Take 1 tablet (10 mg total) by mouth daily. 90 tablet 3  . methocarbamol (ROBAXIN) 500 MG tablet Take 1 tablet (500 mg total) by mouth 3 (three) times daily. 30 tablet 0  . metoprolol succinate (TOPROL XL) 25 MG 24 hr tablet Take 1 tablet (25 mg total) by mouth daily. 90 tablet 3  . nitroGLYCERIN (NITROSTAT) 0.4 MG SL tablet Place 1 tablet (0.4 mg total) under the tongue every 5 (five) minutes x 3 doses as needed for chest pain. 25 tablet 2  . ONETOUCH VERIO test strip CHECK BLOOD SUGARS ONCE DAILY    . predniSONE (DELTASONE) 10 MG tablet TAKE 1 TABLET (10 MG TOTAL) BY MOUTH DAILY WITH BREAKFAST. 30 tablet 0  . rosuvastatin (CRESTOR) 10 MG tablet Take 1 tablet (10 mg total) by  mouth daily. 90 tablet 3  . traMADol (ULTRAM) 50 MG tablet Take 1 tablet (50 mg total) by mouth every 6 (six) hours as needed. 30 tablet 0   No facility-administered medications prior to visit.     Allergies:   Actos [pioglitazone hydrochloride], Codeine, and Lipitor [atorvastatin]   Past Medical History:  Diagnosis Date  . Anemia   . Coronary artery disease   . Diabetes mellitus   . DJD (degenerative joint disease)   . Dysrhythmia   . Edema   . GERD (gastroesophageal reflux disease)   . Hiatal hernia   . History of blood transfusion   . Hyperlipidemia   . Hypertension   . Hypothyroidism   . IBS (irritable bowel syndrome)   . SVT (supraventricular tachycardia) (HCC)    with ablation    Past Surgical History:  Procedure Laterality Date  . APPENDECTOMY    . BREAST SURGERY     biopsy per left breast   . CARDIAC CATHETERIZATION    . CHOLECYSTECTOMY  2005  . CORONARY ARTERY BYPASS GRAFT  2000  . EYE SURGERY     cataract surgery bilat   . REPLACEMENT TOTAL KNEE  2009   left     Social History:   The patient  reports that she has never smoked. She has never used smokeless tobacco. She reports that she does not drink alcohol and does not use drugs.   Family History:  The patient's family history includes Breast cancer in her sister; Coronary artery disease in her brother; Diabetes in her sister; Heart attack (age of onset: 43) in her father; Heart attack (age of onset: 42) in her mother; Heart disease in an other family member.    ROS:  Please see the history of present illness. All other systems are reviewed and  Negative to the above problem except as noted.    PHYSICAL EXAM: VS:  BP (!) 142/66   Pulse 83   Ht 5\' 3"  (1.6 m)   Wt 135 lb (61.2 kg)   SpO2 98%   BMI 23.91 kg/m   GEN: Well nourished, well developed, in no acute distress  HEENT: normal  Neck: JVP is normal  Cardiac: RRR;  S1, S2  No S3  No murmurs  Tr LE   edema  Respiratory:  clear to auscultation bilaterally, normal work of breathing GI: soft, nontender, nondistended, + BS  No hepatomegaly  MS: no deformity Moving all extremities   Skin: warm and dry, no rash Neuro:  Strength and sensation are intact Psych: euthymic mood, full affect   EKG:  EKG is  ordered today    SR 75 bpm   Fist degree AV block  RBBB   LAFB    Lipid Panel    Component Value Date/Time   CHOL 156 06/10/2019 0856   TRIG 110 06/10/2019 0856   HDL 83 06/10/2019 0856   CHOLHDL 1.9 06/10/2019 0856   CHOLHDL 2.6 12/07/2015 0440   VLDL 24 12/07/2015 0440   LDLCALC 54 06/10/2019 0856      Wt Readings from Last 3 Encounters:  11/24/19 135 lb (61.2 kg)  10/23/19 142 lb (64.4 kg)  07/23/19 142 lb (64.4 kg)      ASSESSMENT AND PLAN:  1  CAD   Remote CABG  Pt currently denies symptoms   Follow   2  HL Pt now on Crestor   LDL in APril 2021 was 54  Keep on same meds .  4  HTN  BP is OK   Keep on same meds .  The patient is anxious about her blood pressure, coronary disease, has panic type attacks.  I reassured her. Follow-up in  March.   Current medicines are reviewed at length with the patient today.  The patient does not have concerns regarding medicines.  Signed, Dietrich Pates, MD  11/24/2019 10:03 AM    [facility] [address] [place], [place], [place]  [address] Phone: [phone]; Fax: [phone]

## 2019-11-23 NOTE — Progress Notes (Signed)
[name] - 86 y.o. female MRN [id number]  Date of birth: 11/06/1932  Office Visit Note: Visit Date: 11/11/2019 PCP: Jarome Matin, MD Referred by: Jarome Matin, MD  Subjective: Chief Complaint  Patient presents with  . Lower Back - Pain   HPI:  [name] is a 86 y.o. female who comes in today for planned repeat Bilateral L4-L5 Lumbar epidural steroid injection with fluoroscopic guidance.  The patient has failed conservative care including home exercise, medications, time and activity modification.  This injection will be diagnostic and hopefully therapeutic.  Please see requesting physician notes for further details and justification. Patient received more than 50% pain relief from prior injection.   Referring: Dr. Aldean Baker   Prior injection did offer more than 50% relief as noted above and did give the patient more functional ability for activities of daily living and was also beneficial in that it did reduce her medication requirement.  Procedures are done as part of a comprehensive orthopedic and pain management program.  She reports that the injection in March gave her quite a bit of relief up until July.  She has had to change apartments and done some moving and that has really increased her symptoms.  She reports that medications help to some degree but she does not really like taking those.  She reports worsening with activities around the house with cooking and cleaning.  Pain is mainly right-sided but is bilateral.    ROS Otherwise per HPI.  Assessment & Plan: Visit Diagnoses:  1. Spinal stenosis of lumbar region with neurogenic claudication   2. Lumbar radiculopathy     Plan: No additional findings.   Meds & Orders:  Meds ordered this encounter  Medications  . methylPREDNISolone acetate (DEPO-MEDROL) injection 80 mg    Orders Placed This Encounter  Procedures  . XR C-ARM NO REPORT  . Epidural Steroid injection    Follow-up: Return if symptoms  worsen or fail to improve.   Procedures: No procedures performed  Lumbosacral Transforaminal Epidural Steroid Injection - Sub-Pedicular Approach with Fluoroscopic Guidance  Patient: [name]      Date of Birth: [birth year] MRN: [id number] PCP: Jarome Matin, MD      Visit Date: 11/11/2019   Universal Protocol:    Date/Time: 11/11/2019  Consent Given By: the patient  Position: PRONE  Additional Comments: Vital signs were monitored before and after the procedure. Patient was prepped and draped in the usual sterile fashion. The correct patient, procedure, and site was verified.   Injection Procedure Details:  Procedure Site One Meds Administered:  Meds ordered this encounter  Medications  . methylPREDNISolone acetate (DEPO-MEDROL) injection 80 mg    Laterality: Bilateral  Location/Site:  L4-L5  Needle size: 22 G  Needle type: Spinal  Needle Placement: Transforaminal  Findings:    -Comments: Excellent flow of contrast along the nerve, nerve root and into the epidural space.  Procedure Details: After squaring off the end-plates to get a true AP view, the C-arm was positioned so that an oblique view of the foramen as noted above was visualized. The target area is just inferior to the "nose of the scotty dog" or sub pedicular. The soft tissues overlying this structure were infiltrated with 2-3 ml. of 1% Lidocaine without Epinephrine.  The spinal needle was inserted toward the target using a "trajectory" view along the fluoroscope beam.  Under AP and lateral visualization, the needle was advanced so it did not puncture dura and was located  close the 6 O'Clock position of the pedical in AP tracterory. Biplanar projections were used to confirm position. Aspiration was confirmed to be negative for CSF and/or blood. A 1-2 ml. volume of Isovue-250 was injected and flow of contrast was noted at each level. Radiographs were obtained for documentation purposes.   After  attaining the desired flow of contrast documented above, a 0.5 to 1.0 ml test dose of 0.25% Marcaine was injected into each respective transforaminal space.  The patient was observed for 90 seconds post injection.  After no sensory deficits were reported, and normal lower extremity motor function was noted,   the above injectate was administered so that equal amounts of the injectate were placed at each foramen (level) into the transforaminal epidural space.   Additional Comments:  Right-sided injection without complication left-sided injection with delivery of injectate did cause significant discomfort but patient was doing well at time of discharge. Dressing: 2 x 2 sterile gauze and Band-Aid    Post-procedure details: Patient was observed during the procedure. Post-procedure instructions were reviewed.  Patient left the clinic in stable condition.      Clinical History: MRI LUMBAR SPINE WITHOUT AND WITH CONTRAST  TECHNIQUE: Multiplanar and multiecho pulse sequences of the lumbar spine were obtained without and with intravenous contrast.  CONTRAST:  48mL GADAVIST GADOBUTROL 1 MMOL/ML IV SOLN  COMPARISON:  None.  FINDINGS: Significant motion artifact is present.  Segmentation:  Standard.  Alignment: Grade 1 anterolisthesis at L4-L5 mild retrolisthesis at L1-L2 and L3-L4.  Vertebrae: Vertebral body heights are maintained apart from degenerative endplate irregularity, greatest at L3. There is minor degenerative endplate marrow edema, for example at L5-S1. No suspicious osseous lesion. No evidence of discitis or osteomyelitis.  Conus medullaris and cauda equina: Conus extends to the L1 level. Conus is unremarkable. There is possible reactive nerve root enhancement at the L4-L5 level.  Paraspinal and other soft tissues: Partially image distended bladder. Small calcified uterine fibroid. Otherwise unremarkable.  Disc levels:  L1-L2: Disc bulge and mild facet  arthropathy. No significant canal or foraminal stenosis.  L2-L3: Disc bulge and mild facet arthropathy with ligamentum flavum infolding. No significant canal or foraminal stenosis.  L3-L4: Disc bulge with endplate osteophytic ridging and moderate facet arthropathy with ligamentum flavum infolding. Moderate canal stenosis with narrowing of the lateral recesses. Mild foraminal stenosis.  L4-L5: Anterolisthesis with uncovering of disc bulge. Moderate right and marked left facet arthropathy with ligamentum flavum infolding. Severe canal stenosis with effacement of the lateral recesses and crowding of the cauda equina. Mild to moderate right and moderate left foraminal stenosis.  L5-S1: Disc bulge with endplate osteophytic ridging eccentric to the left and moderate facet arthropathy with ligamentum flavum infolding. No significant canal or right foraminal stenosis. Mild left foraminal stenosis.  IMPRESSION: Motion degraded study.  No evidence of discitis/osteomyelitis.  No epidural collection  Multilevel degenerative changes as detailed above. Most notably, there is severe canal stenosis at L4-L5 with crowding of the cauda equina.   Electronically Signed   By: Guadlupe Spanish M.D.   On: 04/30/2019 19:16     Objective:  VS:  HT:    WT:   BMI:     BP:(!) 164/87  HR:92bpm  TEMP: ( )  RESP:  Physical Exam Constitutional:      General: She is not in acute distress.    Appearance: Normal appearance. She is not ill-appearing.  HENT:     Head: Normocephalic and atraumatic.     Right Ear: External  ear normal.     Left Ear: External ear normal.  Eyes:     Extraocular Movements: Extraocular movements intact.  Cardiovascular:     Rate and Rhythm: Normal rate.     Pulses: Normal pulses.  Musculoskeletal:     Right lower leg: No edema.     Left lower leg: No edema.     Comments: Patient has good distal strength with no pain over the greater trochanters.  No clonus or  focal weakness.  Skin:    Findings: No erythema, lesion or rash.  Neurological:     General: No focal deficit present.     Mental Status: She is alert and oriented to person, place, and time.     Sensory: No sensory deficit.     Motor: No weakness or abnormal muscle tone.     Coordination: Coordination normal.  Psychiatric:        Mood and Affect: Mood normal.        Behavior: Behavior normal.      Imaging: No results found.

## 2019-11-24 ENCOUNTER — Other Ambulatory Visit: Payer: Self-pay

## 2019-11-24 ENCOUNTER — Ambulatory Visit (INDEPENDENT_AMBULATORY_CARE_PROVIDER_SITE_OTHER): Payer: Medicare Other | Admitting: Internal Medicine

## 2019-11-24 ENCOUNTER — Encounter: Payer: Self-pay | Admitting: Internal Medicine

## 2019-11-24 VITALS — BP 142/66 | HR 83 | Ht 63.0 in | Wt 135.0 lb

## 2019-11-24 DIAGNOSIS — R002 Palpitations: Secondary | ICD-10-CM | POA: Diagnosis not present

## 2019-11-24 NOTE — Patient Instructions (Signed)
Medication Instructions:  Your physician recommends that you continue on your current medications as directed. Please refer to the Current Medication list given to you today.  *If you need a refill on your cardiac medications before your next appointment, please call your pharmacy*  Follow-Up: At [facility], you and your health needs are our priority.  As part of our continuing mission to provide you with exceptional heart care, we have created designated Provider Care Teams.  These Care Teams include your primary Cardiologist (physician) and Advanced Practice Providers (APPs -  Physician Assistants and Nurse Practitioners) who all work together to provide you with the care you need, when you need it.  We recommend signing up for the patient portal called "MyChart".  Sign up information is provided on this After Visit Summary.  MyChart is used to connect with patients for Virtual Visits (Telemedicine).  Patients are able to view lab/test results, encounter notes, upcoming appointments, etc.  Non-urgent messages can be sent to your provider as well.   To learn more about what you can do with MyChart, go to [email or web address].    Your next appointment:   8 month(s)  The format for your next appointment:   In Person  Provider:   You may see Dietrich Pates, MD or one of the following Advanced Practice Providers on your designated Care Team:    Tereso Newcomer, PA-C  Vin Matheny, [place]

## 2019-12-22 ENCOUNTER — Ambulatory Visit: Payer: Self-pay

## 2019-12-22 ENCOUNTER — Encounter: Payer: Self-pay | Admitting: Orthopedic Surgery

## 2019-12-22 ENCOUNTER — Ambulatory Visit (INDEPENDENT_AMBULATORY_CARE_PROVIDER_SITE_OTHER): Payer: Medicare Other | Admitting: Physician Assistant

## 2019-12-22 VITALS — Ht 63.0 in | Wt 135.0 lb

## 2019-12-22 DIAGNOSIS — M48061 Spinal stenosis, lumbar region without neurogenic claudication: Secondary | ICD-10-CM

## 2019-12-22 NOTE — Progress Notes (Signed)
Office Visit Note   Patient: [name]           Date of Birth: 02-11-1933           MRN: [id number] Visit Date: 12/22/2019              Requested by: Jarome Matin, MD [address] [place],  [place] [address] PCP: Jarome Matin, MD  Chief Complaint  Patient presents with  . Lower Back - Pain      HPI: Is a 86 year old woman with a history of lumbar stenosis.  She presents today with buttock pain with cramping going down to her legs.  She states this started happening when she had a fall although she caught herself on a chair and did not fall to the ground.  She denies any weakness.  She did have before that an injection into the lumbosacral transforaminal with Dr. Alvester Morin.  She is unsure if this was helpful.  She is taken prednisone in the past but is not sure if it helped.  She is worried about having swelling in her legs and wonder if this is related to her spine.  Assessment & Plan: Visit Diagnoses:  1. Spinal stenosis of lumbar region, unspecified whether neurogenic claudication present     Plan: I would like for her to try prednisone 10 mg once daily with breakfast for 5 days she can back off to every other day.  When I talked to her about wearing compression stockings which I think would greatly help her lower legs she said she does not like to wear them because they bother her too much.  We talked about using coconut water for some of her cramping.  This may be related to her stenosis.  Follow-Up Instructions: No follow-ups on file.   Ortho Exam  Patient is alert, oriented, no adenopathy, well-dressed, normal affect, normal respiratory effort. She is tender over both buttocks.  No tenderness over her coccyx or sacrum.  Negative straight leg rise bilaterally she has 5 out of 5 strength with dorsiflexion and plantarflexion.  Imaging: No results found. No images are attached to the encounter.  Labs: Lab Results  Component Value Date   HGBA1C 5.6 12/07/2015     ESRSEDRATE 2 11/19/2008   REPTSTATUS 05/01/2019 FINAL 04/30/2019   CULT (A) 04/30/2019    <10,000 COLONIES/mL INSIGNIFICANT GROWTH Performed at [facility], [address] [place], [place] [address]      Lab Results  Component Value Date   ALBUMIN 4.4 06/10/2019   ALBUMIN 3.8 04/30/2019   ALBUMIN 3.9 11/28/2018    Lab Results  Component Value Date   MG 1.9 11/19/2008   MG 2.0 03/31/2007   Lab Results  Component Value Date   VD25OH 49 12/11/2008    No results found for: PREALBUMIN CBC EXTENDED Latest Ref Rng & Units 07/15/2019 04/30/2019 11/28/2018  WBC 4.0 - 10.5 K/uL 6.6 18.5(H) 7.5  RBC 3.87 - 5.11 MIL/uL 4.00 4.04 3.89  HGB 12.0 - 15.0 g/dL 56.3 87.5 64.3  HCT 36 - 46 % 37.2 36.5 35.6(L)  PLT 150 - 400 K/uL 189 185 147(L)  NEUTROABS 1.7 - 7.7 K/uL - 15.1(H) 4.4  LYMPHSABS 0.7 - 4.0 K/uL - 1.0 1.8     Body mass index is 23.91 kg/m.  Orders:  Orders Placed This Encounter  Procedures  . XR Lumbar Spine 2-3 Views  . XR Sacrum/Coccyx   No orders of the defined types were placed in this encounter.  Procedures: No procedures performed  Clinical Data: No additional findings.  ROS:  All other systems negative, except as noted in the HPI. Review of Systems  Objective: Vital Signs: Ht 5\' 3"  (1.6 m)   Wt 135 lb (61.2 kg)   BMI 23.91 kg/m   Specialty Comments:  No specialty comments available.  PMFS History: Patient Active Problem List   Diagnosis Date Noted  . Chest pain 11/28/2018  . Chest pain on breathing 12/06/2015  . Exertional chest pain   . COUGH 06/24/2009  . HYPOPOTASSEMIA 12/31/2008  . Pure hypercholesterolemia 12/16/2008  . Hyperlipidemia, mixed 08/28/2008  . HYPERTENSION, BENIGN 08/28/2008  . S/P CABG x 3 08/28/2008  . GERD 10/16/2007   Past Medical History:  Diagnosis Date  . Anemia   . Coronary artery disease   . Diabetes mellitus   . DJD (degenerative joint disease)   . Dysrhythmia   . Edema   . GERD  (gastroesophageal reflux disease)   . Hiatal hernia   . History of blood transfusion   . Hyperlipidemia   . Hypertension   . Hypothyroidism   . IBS (irritable bowel syndrome)   . SVT (supraventricular tachycardia) (HCC)    with ablation    Family History  Problem Relation Age of Onset  . Heart attack Mother 4       died  . Heart attack Father 55       died  . Breast cancer Sister   . Diabetes Sister        and brother  . Heart disease Other        sister, father and brother  . Coronary artery disease Brother        PCI    Past Surgical History:  Procedure Laterality Date  . APPENDECTOMY    . BREAST SURGERY     biopsy per left breast   . CARDIAC CATHETERIZATION    . CHOLECYSTECTOMY  2005  . CORONARY ARTERY BYPASS GRAFT  2000  . EYE SURGERY     cataract surgery bilat   . REPLACEMENT TOTAL KNEE  2009   left   Social History   Occupational History  . Occupation: retired    2010: RETIRED  Tobacco Use  . Smoking status: Never Smoker  . Smokeless tobacco: Never Used  Substance and Sexual Activity  . Alcohol use: No  . Drug use: No  . Sexual activity: Not on file

## 2019-12-23 ENCOUNTER — Other Ambulatory Visit: Payer: Self-pay | Admitting: Physician Assistant

## 2019-12-23 ENCOUNTER — Telehealth: Payer: Self-pay | Admitting: Orthopedic Surgery

## 2019-12-23 MED ORDER — PREDNISONE 10 MG PO TABS
10.0000 mg | ORAL_TABLET | Freq: Every day | ORAL | 0 refills | Status: DC
Start: 2019-12-23 — End: 2020-01-29

## 2020-01-12 ENCOUNTER — Ambulatory Visit: Payer: Medicare Other | Admitting: Orthopedic Surgery

## 2020-01-22 ENCOUNTER — Other Ambulatory Visit: Payer: Self-pay | Admitting: Internal Medicine

## 2020-01-24 ENCOUNTER — Other Ambulatory Visit: Payer: Self-pay | Admitting: Internal Medicine

## 2020-01-29 ENCOUNTER — Encounter: Payer: Self-pay | Admitting: Orthopedic Surgery

## 2020-01-29 ENCOUNTER — Ambulatory Visit (INDEPENDENT_AMBULATORY_CARE_PROVIDER_SITE_OTHER): Payer: Medicare Other | Admitting: Physician Assistant

## 2020-01-29 DIAGNOSIS — M48061 Spinal stenosis, lumbar region without neurogenic claudication: Secondary | ICD-10-CM

## 2020-01-29 MED ORDER — PREDNISONE 10 MG PO TABS
10.0000 mg | ORAL_TABLET | Freq: Every day | ORAL | 0 refills | Status: DC
Start: 2020-01-29 — End: 2021-07-12

## 2020-01-29 NOTE — Progress Notes (Signed)
Office Visit Note   Patient: [name]           Date of Birth: 06/19/1932           MRN: [id number] Visit Date: 01/29/2020              Requested by: Jarome Matin, MD [address] [place],  [place] [address] PCP: Jarome Matin, MD  Chief Complaint  Patient presents with  . Lower Back - Pain      HPI: This is a pleasant 86 year old woman with a chronic history of lower back pain.  She has had epidural steroid injections with Dr. Alvester Morin in the past.  At her last visit she was having some sciatic pain and also cramping in her legs.  She has been using some coconut water as was suggested.  She also was taking the prednisone that she thinks helped just a little bit.  She would like to have another injection with Dr. Alvester Morin if possible  Assessment & Plan: Visit Diagnoses: No diagnosis found.  Plan: Patient will follow up with Dr. Alvester Morin for epidural steroid injection she does have a MRI from March and has had no traumatic injuries per her recollection since then  Follow-Up Instructions: No follow-ups on file.   Ortho Exam  Patient is alert, oriented, no adenopathy, well-dressed, normal affect, normal respiratory effort.  She ambulates slowly with a cane.  She has a negative straight leg raise bilaterally no motor weakness in either lower extremity   Imaging: No results found. No images are attached to the encounter.  Labs: Lab Results  Component Value Date   HGBA1C 5.6 12/07/2015   ESRSEDRATE 2 11/19/2008   REPTSTATUS 05/01/2019 FINAL 04/30/2019   CULT (A) 04/30/2019    <10,000 COLONIES/mL INSIGNIFICANT GROWTH Performed at [facility], [address], [place], [place] [address]      Lab Results  Component Value Date   ALBUMIN 4.4 06/10/2019   ALBUMIN 3.8 04/30/2019   ALBUMIN 3.9 11/28/2018    Lab Results  Component Value Date   MG 1.9 11/19/2008   MG 2.0 03/31/2007   Lab Results  Component Value Date   VD25OH 49 12/11/2008    No  results found for: PREALBUMIN CBC EXTENDED Latest Ref Rng & Units 07/15/2019 04/30/2019 11/28/2018  WBC 4.0 - 10.5 K/uL 6.6 18.5(H) 7.5  RBC 3.87 - 5.11 MIL/uL 4.00 4.04 3.89  HGB 12.0 - 15.0 g/dL 38.4 66.5 99.3  HCT 36 - 46 % 37.2 36.5 35.6(L)  PLT 150 - 400 K/uL 189 185 147(L)  NEUTROABS 1.7 - 7.7 K/uL - 15.1(H) 4.4  LYMPHSABS 0.7 - 4.0 K/uL - 1.0 1.8     There is no height or weight on file to calculate BMI.  Orders:  No orders of the defined types were placed in this encounter.  Meds ordered this encounter  Medications  . predniSONE (DELTASONE) 10 MG tablet    Sig: Take 1 tablet (10 mg total) by mouth daily with breakfast.    Dispense:  30 tablet    Refill:  0     Procedures: No procedures performed  Clinical Data: No additional findings.  ROS:  All other systems negative, except as noted in the HPI. Review of Systems  Objective: Vital Signs: There were no vitals taken for this visit.  Specialty Comments:  No specialty comments available.  PMFS History: Patient Active Problem List   Diagnosis Date Noted  . Chest pain 11/28/2018  . Chest  pain on breathing 12/06/2015  . Exertional chest pain   . COUGH 06/24/2009  . HYPOPOTASSEMIA 12/31/2008  . Pure hypercholesterolemia 12/16/2008  . Hyperlipidemia, mixed 08/28/2008  . HYPERTENSION, BENIGN 08/28/2008  . S/P CABG x 3 08/28/2008  . GERD 10/16/2007   Past Medical History:  Diagnosis Date  . Anemia   . Coronary artery disease   . Diabetes mellitus   . DJD (degenerative joint disease)   . Dysrhythmia   . Edema   . GERD (gastroesophageal reflux disease)   . Hiatal hernia   . History of blood transfusion   . Hyperlipidemia   . Hypertension   . Hypothyroidism   . IBS (irritable bowel syndrome)   . SVT (supraventricular tachycardia) (HCC)    with ablation    Family History  Problem Relation Age of Onset  . Heart attack Mother 78       died  . Heart attack Father 9       died  . Breast cancer Sister    . Diabetes Sister        and brother  . Heart disease Other        sister, father and brother  . Coronary artery disease Brother        PCI    Past Surgical History:  Procedure Laterality Date  . APPENDECTOMY    . BREAST SURGERY     biopsy per left breast   . CARDIAC CATHETERIZATION    . CHOLECYSTECTOMY  2005  . CORONARY ARTERY BYPASS GRAFT  2000  . EYE SURGERY     cataract surgery bilat   . REPLACEMENT TOTAL KNEE  2009   left   Social History   Occupational History  . Occupation: retired    Associate Professor: RETIRED  Tobacco Use  . Smoking status: Never Smoker  . Smokeless tobacco: Never Used  Substance and Sexual Activity  . Alcohol use: No  . Drug use: No  . Sexual activity: Not on file

## 2020-02-02 ENCOUNTER — Telehealth: Payer: Self-pay | Admitting: Physical Medicine and Rehabilitation

## 2020-02-02 NOTE — Telephone Encounter (Signed)
Pt not req Auth#. 

## 2020-02-02 NOTE — Telephone Encounter (Signed)
-----   Message from Rodena Medin, [place] sent at 01/30/2020 11:06 AM EST ----- Regarding: FW: injection question Can you please  schedule with Dr. Alvester Morin that will be great! Thank you very much! ----- Message ----- From: Cindie Crumbly, RT Sent: 01/29/2020   4:28 PM EST To: Rodena Medin, RMA Subject: FW: injection question                         We can schedule a repeat without a new referral. Our next available is on 12/22. [facility] might be faster. Let me know if you think the patient will be ok to wait on Dr. [place] Blas next appointment and I will call to schedule. ----- Message ----- From: Tyrell Antonio, MD Sent: 01/29/2020   4:19 PM EST To: Cindie Crumbly, RT Subject: RE: injection question                         Repeat ok vs [place] ----- Message ----- From: Cindie Crumbly, RT Sent: 01/29/2020   3:36 PM EST To: Tyrell Antonio, MD Subject: FW: injection question                         Patient had bilateral L4 TF on 9/14. Please advise. ----- Message ----- From: Rodena Medin, RMA Sent: 01/29/2020   3:29 PM EST To: Cindie Crumbly, RT Subject: injection question                             This pt has had an injection with Dr. Alvester Morin and was doing well but twisted her back recently and is wanting to know if she can have another injection. Do I need to enter another referral for her?

## 2020-02-02 NOTE — Telephone Encounter (Signed)
Needs authorization for bilateral L4 TF. Scheduled for 12/13.

## 2020-02-09 ENCOUNTER — Ambulatory Visit (INDEPENDENT_AMBULATORY_CARE_PROVIDER_SITE_OTHER): Payer: Medicare Other | Admitting: Physical Medicine and Rehabilitation

## 2020-02-09 ENCOUNTER — Encounter: Payer: Self-pay | Admitting: Physical Medicine and Rehabilitation

## 2020-02-09 ENCOUNTER — Ambulatory Visit: Payer: Self-pay

## 2020-02-09 ENCOUNTER — Other Ambulatory Visit: Payer: Self-pay

## 2020-02-09 VITALS — BP 140/73 | HR 80

## 2020-02-09 DIAGNOSIS — M48062 Spinal stenosis, lumbar region with neurogenic claudication: Secondary | ICD-10-CM

## 2020-02-09 DIAGNOSIS — M5416 Radiculopathy, lumbar region: Secondary | ICD-10-CM

## 2020-02-09 MED ORDER — METHYLPREDNISOLONE ACETATE 80 MG/ML IJ SUSP
80.0000 mg | Freq: Once | INTRAMUSCULAR | Status: AC
  Administered 2020-02-09: 80 mg

## 2020-02-09 NOTE — Progress Notes (Signed)
Pt state lower back pain that travel to both buttock and down to her thighs. Pt state walking and standing ,makes the pain worse. Pt state her use ice and take pain meds to help ease the pain. Pt has hx of inj on 11/11/19 pt state it didn't work as well.  Numeric Pain Rating Scale and Functional Assessment Average Pain 4   In the last MONTH (on 0-10 scale) has pain interfered with the following?  1. General activity like being  able to carry out your everyday physical activities such as walking, climbing stairs, carrying groceries, or moving a chair?  Rating(8)   +Driver, -BT, -Dye Allergies.

## 2020-03-31 ENCOUNTER — Other Ambulatory Visit: Payer: Self-pay | Admitting: Internal Medicine

## 2020-04-11 NOTE — Procedures (Signed)
Lumbosacral Transforaminal Epidural Steroid Injection - Sub-Pedicular Approach with Fluoroscopic Guidance  Patient: [name]      Date of Birth: 1932-04-09 MRN: [id number] PCP: Jarome Matin, MD      Visit Date: 02/09/2020   Universal Protocol:    Date/Time: 02/09/2020  Consent Given By: the patient  Position: PRONE  Additional Comments: Vital signs were monitored before and after the procedure. Patient was prepped and draped in the usual sterile fashion. The correct patient, procedure, and site was verified.   Injection Procedure Details:   Procedure diagnoses: Spinal stenosis of lumbar region with neurogenic claudication [M48.062]    Meds Administered:  Meds ordered this encounter  Medications  . methylPREDNISolone acetate (DEPO-MEDROL) injection 80 mg    Laterality: Bilateral  Location/Site:  L4-L5  Needle:5.0 in., 22 ga.  Short bevel or Quincke spinal needle  Needle Placement: Transforaminal  Findings:    -Comments: Excellent flow of contrast along the nerve, nerve root and into the epidural space.  Procedure Details: After squaring off the end-plates to get a true AP view, the C-arm was positioned so that an oblique view of the foramen as noted above was visualized. The target area is just inferior to the "nose of the scotty dog" or sub pedicular. The soft tissues overlying this structure were infiltrated with 2-3 ml. of 1% Lidocaine without Epinephrine.  The spinal needle was inserted toward the target using a "trajectory" view along the fluoroscope beam.  Under AP and lateral visualization, the needle was advanced so it did not puncture dura and was located close the 6 O'Clock position of the pedical in AP tracterory. Biplanar projections were used to confirm position. Aspiration was confirmed to be negative for CSF and/or blood. A 1-2 ml. volume of Isovue-250 was injected and flow of contrast was noted at each level. Radiographs were obtained for  documentation purposes.   After attaining the desired flow of contrast documented above, a 0.5 to 1.0 ml test dose of 0.25% Marcaine was injected into each respective transforaminal space.  The patient was observed for 90 seconds post injection.  After no sensory deficits were reported, and normal lower extremity motor function was noted,   the above injectate was administered so that equal amounts of the injectate were placed at each foramen (level) into the transforaminal epidural space.   Additional Comments:  The patient tolerated the procedure well Dressing: 2 x 2 sterile gauze and Band-Aid    Post-procedure details: Patient was observed during the procedure. Post-procedure instructions were reviewed.  Patient left the clinic in stable condition.

## 2020-04-11 NOTE — Progress Notes (Signed)
BENITA BOONSTRA - 87 y.o. female MRN [id number]  Date of birth: [birth year]  Office Visit Note: Visit Date: 02/09/2020 PCP: Jarome Matin, MD Referred by: Jarome Matin, MD  Subjective: Chief Complaint  Patient presents with  . Lower Back - Pain   HPI:  MAME TWOMBLY is a 87 y.o. female who comes in today at the request of Dr. Aldean Baker for planned Bilateral L4-L5 Lumbar epidural steroid injection with fluoroscopic guidance.  The patient has failed conservative care including home exercise, medications, time and activity modification.  This injection will be diagnostic and hopefully therapeutic.  Please see requesting physician notes for further details and justification. Has done well with intermittent epidural injections at L4-5 where she has severe multifactorial stenosis. Moderate stenosis above this level. Last injection in September did help her but did not help her as well as prior injection. We discussed at length the nature of steroid medications and how this can fluctuate in terms of relief and to not necessarily count things out until we try 1 more and see if this is a trend at all. Unfortunately her options are limited lumbar decompression could be considered but she is 61 and does not really want to consider surgery.   ROS Otherwise per HPI.  Assessment & Plan: Visit Diagnoses:    ICD-10-CM   1. Spinal stenosis of lumbar region with neurogenic claudication  M48.062 XR C-ARM NO REPORT    Epidural Steroid injection    methylPREDNISolone acetate (DEPO-MEDROL) injection 80 mg  2. Lumbar radiculopathy  M54.16 XR C-ARM NO REPORT    Epidural Steroid injection    methylPREDNISolone acetate (DEPO-MEDROL) injection 80 mg    Plan: No additional findings.   Meds & Orders:  Meds ordered this encounter  Medications  . methylPREDNISolone acetate (DEPO-MEDROL) injection 80 mg    Orders Placed This Encounter  Procedures  . XR C-ARM NO REPORT  . Epidural Steroid injection     Follow-up: Return if symptoms worsen or fail to improve.   Procedures: No procedures performed  Lumbosacral Transforaminal Epidural Steroid Injection - Sub-Pedicular Approach with Fluoroscopic Guidance  Patient: RITISHA DEITRICK      Date of Birth: 06-17-1932 MRN: [id number] PCP: Jarome Matin, MD      Visit Date: 02/09/2020   Universal Protocol:    Date/Time: 02/09/2020  Consent Given By: the patient  Position: PRONE  Additional Comments: Vital signs were monitored before and after the procedure. Patient was prepped and draped in the usual sterile fashion. The correct patient, procedure, and site was verified.   Injection Procedure Details:   Procedure diagnoses: Spinal stenosis of lumbar region with neurogenic claudication [M48.062]    Meds Administered:  Meds ordered this encounter  Medications  . methylPREDNISolone acetate (DEPO-MEDROL) injection 80 mg    Laterality: Bilateral  Location/Site:  L4-L5  Needle:5.0 in., 22 ga.  Short bevel or Quincke spinal needle  Needle Placement: Transforaminal  Findings:    -Comments: Excellent flow of contrast along the nerve, nerve root and into the epidural space.  Procedure Details: After squaring off the end-plates to get a true AP view, the C-arm was positioned so that an oblique view of the foramen as noted above was visualized. The target area is just inferior to the "nose of the scotty dog" or sub pedicular. The soft tissues overlying this structure were infiltrated with 2-3 ml. of 1% Lidocaine without Epinephrine.  The spinal needle was inserted toward the target using a "trajectory" view along the  fluoroscope beam.  Under AP and lateral visualization, the needle was advanced so it did not puncture dura and was located close the 6 O'Clock position of the pedical in AP tracterory. Biplanar projections were used to confirm position. Aspiration was confirmed to be negative for CSF and/or blood. A 1-2 ml. volume of  Isovue-250 was injected and flow of contrast was noted at each level. Radiographs were obtained for documentation purposes.   After attaining the desired flow of contrast documented above, a 0.5 to 1.0 ml test dose of 0.25% Marcaine was injected into each respective transforaminal space.  The patient was observed for 90 seconds post injection.  After no sensory deficits were reported, and normal lower extremity motor function was noted,   the above injectate was administered so that equal amounts of the injectate were placed at each foramen (level) into the transforaminal epidural space.   Additional Comments:  The patient tolerated the procedure well Dressing: 2 x 2 sterile gauze and Band-Aid    Post-procedure details: Patient was observed during the procedure. Post-procedure instructions were reviewed.  Patient left the clinic in stable condition.      Clinical History: MRI LUMBAR SPINE WITHOUT AND WITH CONTRAST  TECHNIQUE: Multiplanar and multiecho pulse sequences of the lumbar spine were obtained without and with intravenous contrast.  CONTRAST:  70mL GADAVIST GADOBUTROL 1 MMOL/ML IV SOLN  COMPARISON:  None.  FINDINGS: Significant motion artifact is present.  Segmentation:  Standard.  Alignment: Grade 1 anterolisthesis at L4-L5 mild retrolisthesis at L1-L2 and L3-L4.  Vertebrae: Vertebral body heights are maintained apart from degenerative endplate irregularity, greatest at L3. There is minor degenerative endplate marrow edema, for example at L5-S1. No suspicious osseous lesion. No evidence of discitis or osteomyelitis.  Conus medullaris and cauda equina: Conus extends to the L1 level. Conus is unremarkable. There is possible reactive nerve root enhancement at the L4-L5 level.  Paraspinal and other soft tissues: Partially image distended bladder. Small calcified uterine fibroid. Otherwise unremarkable.  Disc levels:  L1-L2: Disc bulge and mild facet  arthropathy. No significant canal or foraminal stenosis.  L2-L3: Disc bulge and mild facet arthropathy with ligamentum flavum infolding. No significant canal or foraminal stenosis.  L3-L4: Disc bulge with endplate osteophytic ridging and moderate facet arthropathy with ligamentum flavum infolding. Moderate canal stenosis with narrowing of the lateral recesses. Mild foraminal stenosis.  L4-L5: Anterolisthesis with uncovering of disc bulge. Moderate right and marked left facet arthropathy with ligamentum flavum infolding. Severe canal stenosis with effacement of the lateral recesses and crowding of the cauda equina. Mild to moderate right and moderate left foraminal stenosis.  L5-S1: Disc bulge with endplate osteophytic ridging eccentric to the left and moderate facet arthropathy with ligamentum flavum infolding. No significant canal or right foraminal stenosis. Mild left foraminal stenosis.  IMPRESSION: Motion degraded study.  No evidence of discitis/osteomyelitis.  No epidural collection  Multilevel degenerative changes as detailed above. Most notably, there is severe canal stenosis at L4-L5 with crowding of the cauda equina.   Electronically Signed   By: Guadlupe Spanish M.D.   On: 04/30/2019 19:16     Objective:  VS:  HT:    WT:   BMI:     BP:140/73  HR:80bpm  TEMP: ( )  RESP:  Physical Exam Vitals and nursing note reviewed.  Constitutional:      General: She is not in acute distress.    Appearance: Normal appearance. She is not ill-appearing.  HENT:     Head: Normocephalic  and atraumatic.     Right Ear: External ear normal.     Left Ear: External ear normal.  Eyes:     Extraocular Movements: Extraocular movements intact.  Cardiovascular:     Rate and Rhythm: Normal rate.     Pulses: Normal pulses.  Pulmonary:     Effort: Pulmonary effort is normal. No respiratory distress.  Abdominal:     General: There is no distension.     Palpations: Abdomen  is soft.  Musculoskeletal:        General: Tenderness present.     Cervical back: Neck supple.     Right lower leg: No edema.     Left lower leg: No edema.     Comments: Patient has good distal strength with no pain over the greater trochanters.  No clonus or focal weakness.  Skin:    Findings: No erythema, lesion or rash.  Neurological:     General: No focal deficit present.     Mental Status: She is alert and oriented to person, place, and time.     Sensory: No sensory deficit.     Motor: No weakness or abnormal muscle tone.     Coordination: Coordination normal.  Psychiatric:        Mood and Affect: Mood normal.        Behavior: Behavior normal.      Imaging: No results found.

## 2020-04-20 ENCOUNTER — Telehealth: Payer: Self-pay | Admitting: Physical Medicine and Rehabilitation

## 2020-06-03 IMAGING — CT CT ABD-PELV W/ CM
2 of 5 series · 16 of 46 positions shown, 18 images · IV contrast (Omni 300)
Comparison: CT 12/01/2014

CLINICAL DATA: Abdominal pain.  Abscess or infection suspected.

EXAM:
CT ABDOMEN AND PELVIS WITH CONTRAST
TECHNIQUE: Multidetector CT imaging of the abdomen and pelvis was performed
using the standard protocol following bolus administration of
intravenous contrast.
CONTRAST:  100mL OMNIPAQUE IOHEXOL 300 MG/ML  SOLN

[Series 3: a/p w/ 5mm · axial · 0.82mm/px · z∈[+775,+1150]mm · 13 of 85 slices shown, 15 images]
[im 5/85  soft-tissue]
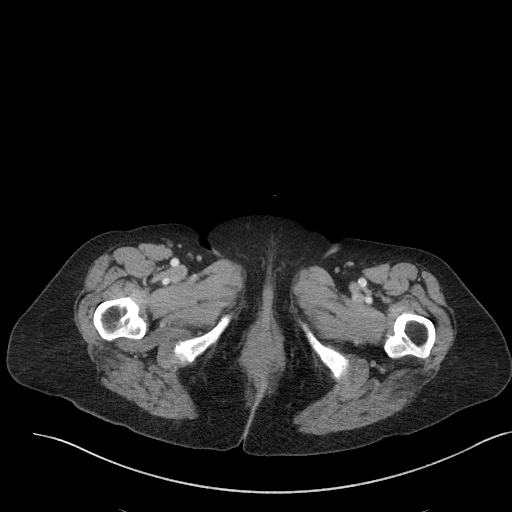
[im 5/85  bone]
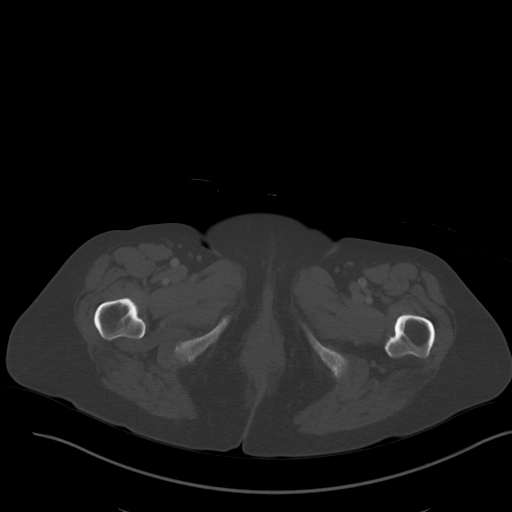
[im 13/85  soft-tissue]
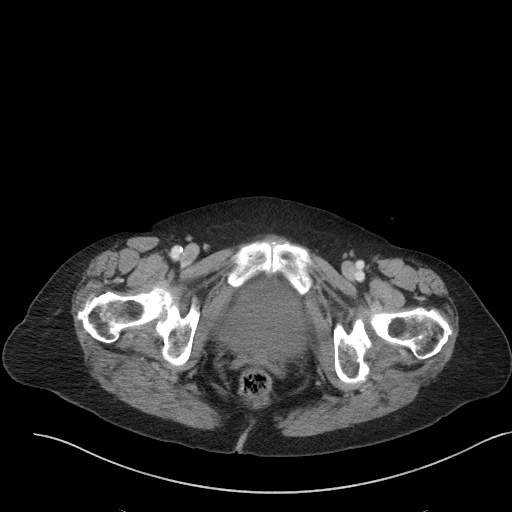
[im 17/85  soft-tissue]
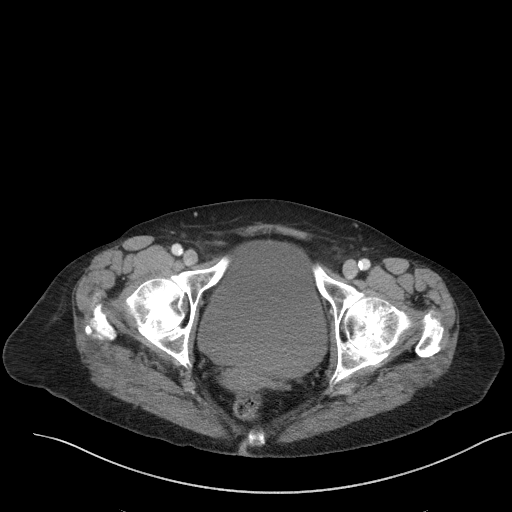
[im 26/85  soft-tissue]
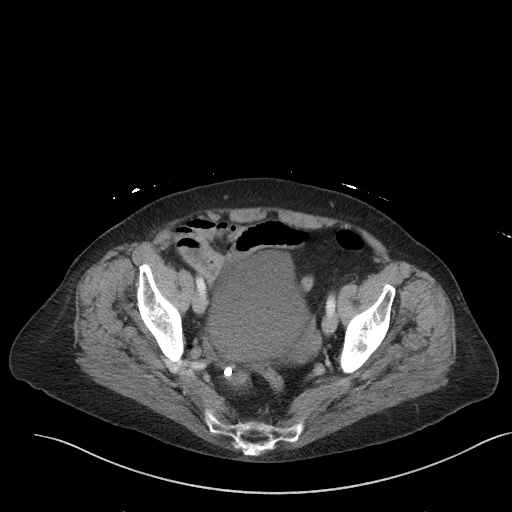
[im 30/85  soft-tissue]
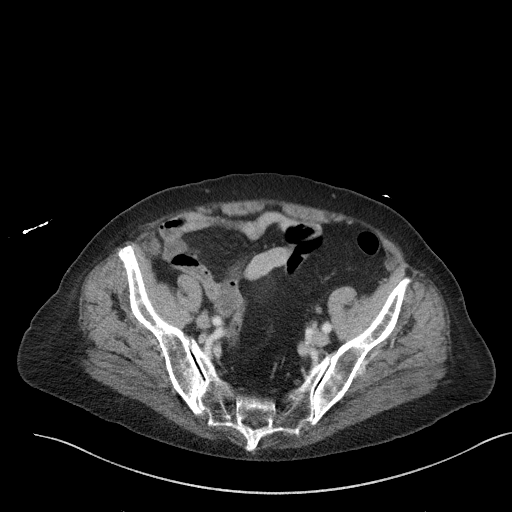
[im 38/85  soft-tissue]
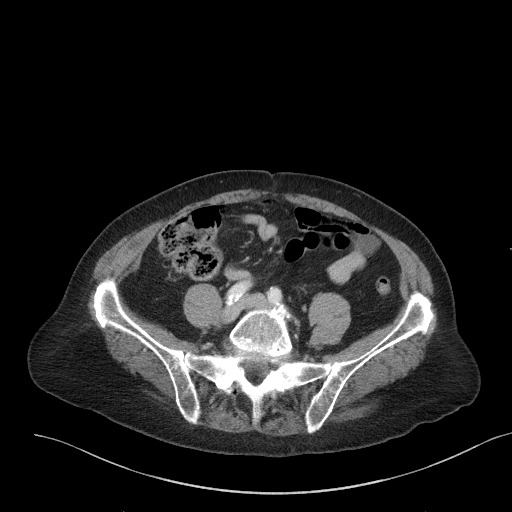
[im 43/85  soft-tissue]
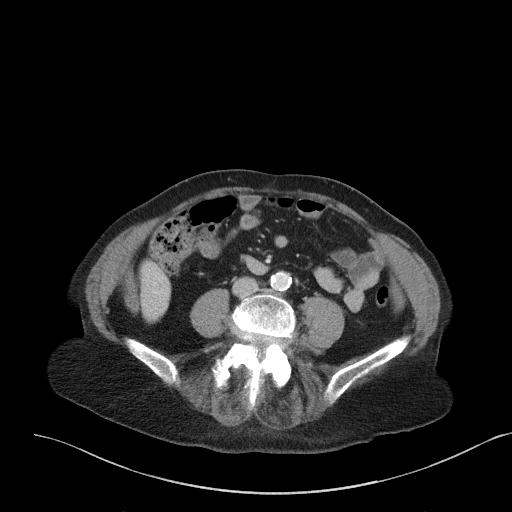
[im 47/85  soft-tissue]
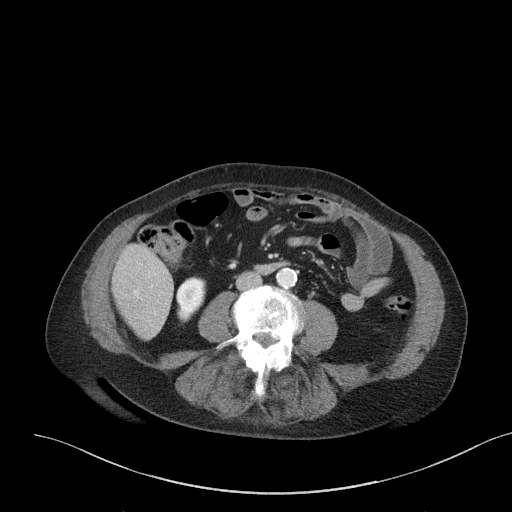
[im 55/85  soft-tissue]
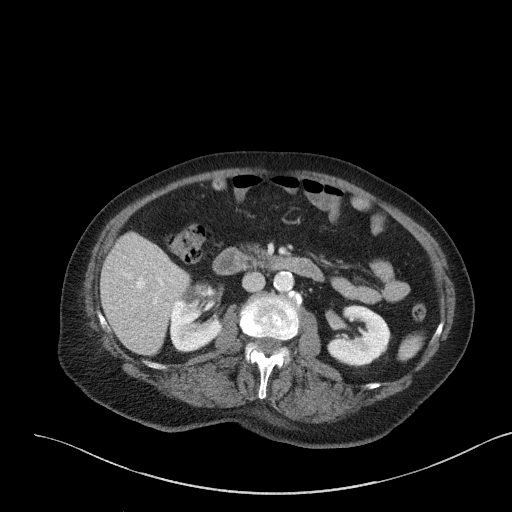
[im 55/85  bone]
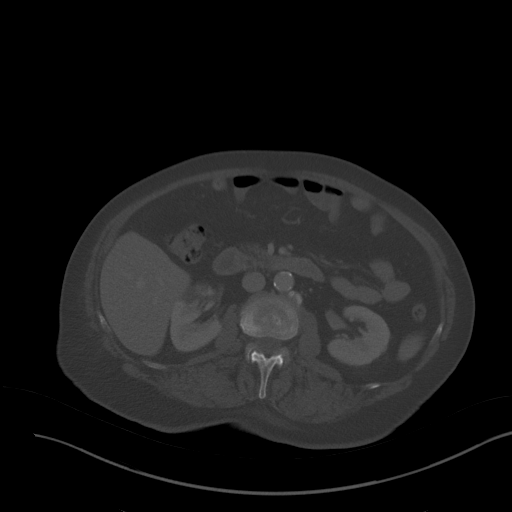
[im 59/85  soft-tissue]
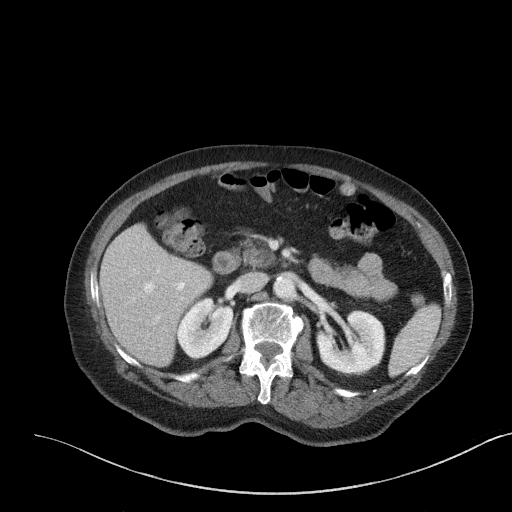
[im 68/85  soft-tissue]
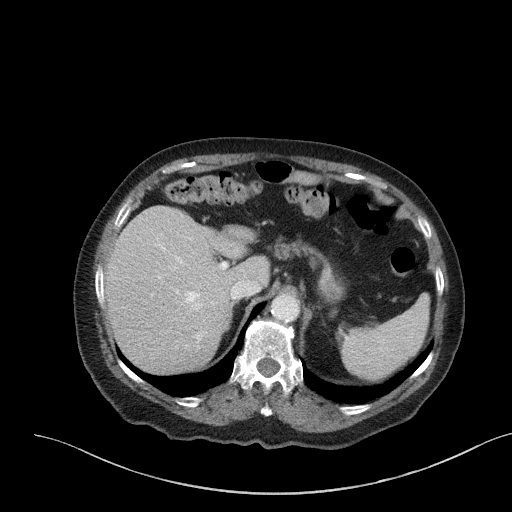
[im 72/85  soft-tissue]
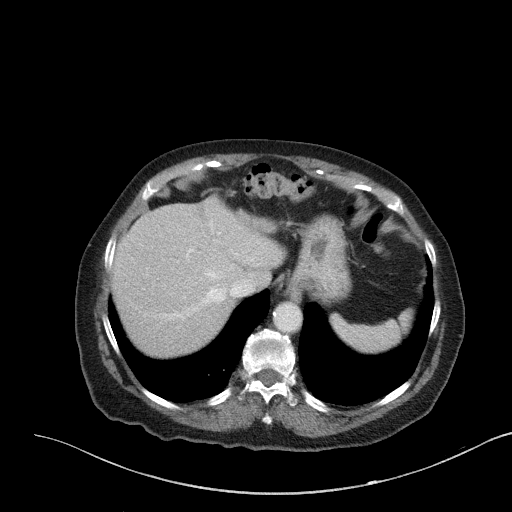
[im 80/85  soft-tissue]
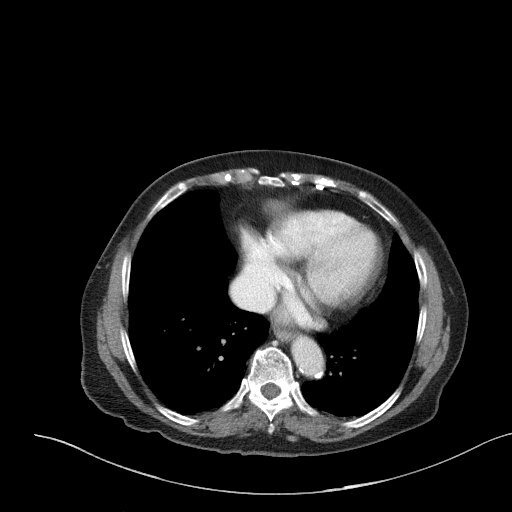

[Series 6: a/p w/ cor · coronal · 0.69mm/px · 3 of 150 slices shown]
[im 50/150  soft-tissue]
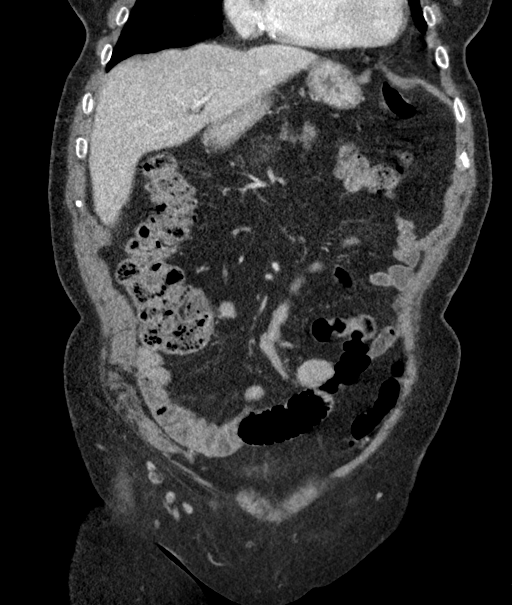
[im 67/150  soft-tissue]
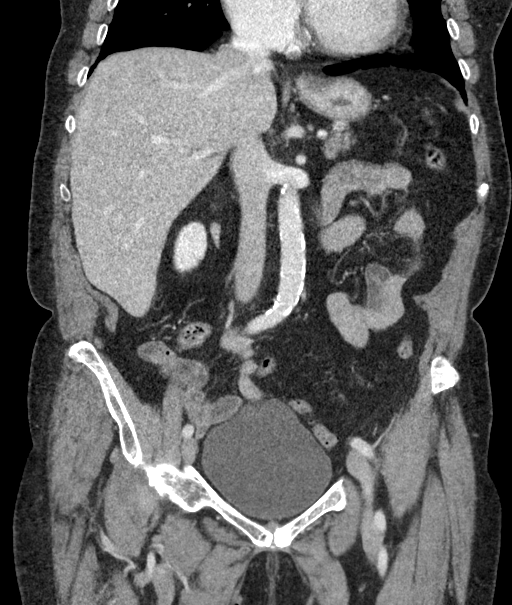
[im 83/150  soft-tissue]
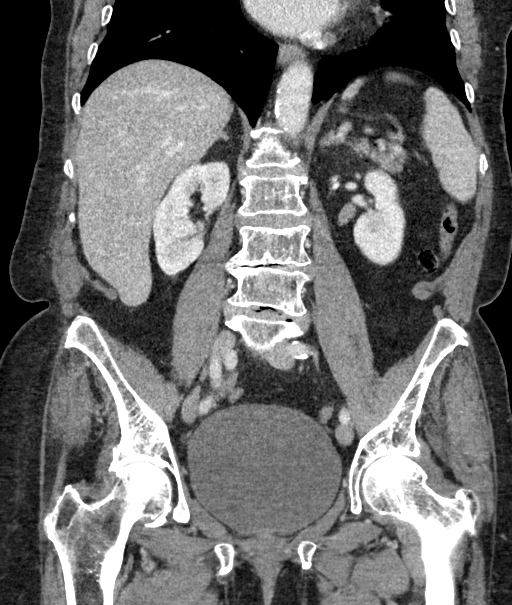

[16 of 46 positions shown; findings below may reference images not displayed]

FINDINGS: Lower chest: Lung bases are clear.

Hepatobiliary: No focal hepatic lesion. Postcholecystectomy. No
biliary dilatation.

Pancreas: Pancreas is normal. No ductal dilatation. No pancreatic
inflammation.

Spleen: Normal spleen

Adrenals/urinary tract: Adrenal glands and kidneys are normal. Small
simple fluid attenuation lesion upper pole of the LEFT kidney likely
represents benign cysts. Subcapsular lesion in the anterior RIGHT
hepatic lobe also likely represent benign cysts. Lesions are
unchanged from CT 5610. The ureters and bladder normal.

Stomach/Bowel: Stomach, small-bowel and cecum are normal. The
appendix is not identified but there is no pericecal inflammation to
suggest appendicitis. The colon and rectosigmoid colon are normal.

Vascular/Lymphatic: Abdominal aorta is normal caliber with
atherosclerotic calcification. There is no retroperitoneal or
periportal lymphadenopathy. No pelvic lymphadenopathy.

Reproductive: Uterus and ovaries normal for age.

Other: No free fluid.

Musculoskeletal: No aggressive osseous lesion.
IMPRESSION: 1. No acute abdominal or pelvic findings.
2. Benign renal cysts.
3. No abscess or infection identified.

## 2020-06-07 NOTE — Telephone Encounter (Signed)
done

## 2020-06-19 ENCOUNTER — Other Ambulatory Visit: Payer: Self-pay | Admitting: Physician Assistant

## 2020-07-07 ENCOUNTER — Encounter: Payer: Self-pay | Admitting: Family

## 2020-07-07 ENCOUNTER — Ambulatory Visit: Payer: Medicare Other | Admitting: Family

## 2020-07-07 DIAGNOSIS — M25531 Pain in right wrist: Secondary | ICD-10-CM | POA: Diagnosis not present

## 2020-07-07 DIAGNOSIS — M48061 Spinal stenosis, lumbar region without neurogenic claudication: Secondary | ICD-10-CM

## 2020-07-08 ENCOUNTER — Other Ambulatory Visit: Payer: Self-pay | Admitting: Physician Assistant

## 2020-07-12 NOTE — Progress Notes (Signed)
Office Visit Note   Patient: [name]           Date of Birth: December 11, 1932           MRN: [id number] Visit Date: 07/07/2020              Requested by: Jarome Matin, MD [address] [place],  [place] [address] PCP: Jarome Matin, MD  Chief Complaint  Patient presents with  . Lower Back - Pain      HPI: The patient is an 87 year old woman who presents to complaining of low back pain as well as right leg pain for the last week.  She reports she cannot sit long in her recliner due to the pain and cramping in her right leg she has pain in her buttocks.  She had previously been taking some prednisone after her previous visit and this did help ease her symptoms.  She walks with a walker and has been trying to do her walking and is been having difficulty with her activities of daily living due to her back and shooting leg pain.  She last had epidural steroid injection with Dr. Alvester Morin in December of last year this relieved her symptoms quite well.  Today she is requesting repeat injection.  She is also complaining of some numbness and tingling as well as pain in her right hand weakness difficulty opening things with the right hand.  This has been gradually worsening over several weeks  Assessment & Plan: Visit Diagnoses:  1. Spinal stenosis of lumbar region, unspecified whether neurogenic claudication present   2. Pain in right wrist     Plan: Suspected carpal tunnel syndrome on the right.  Will confirm this with nerve conduction studies.  Will refer to Dr. Alvester Morin for epidural steroid injection  Follow-Up Instructions: No follow-ups on file.   Back Exam   Tenderness  The patient is experiencing no tenderness.   Muscle Strength  The patient has normal back strength.  Tests  Straight leg raise right: positive Straight leg raise left: positive  Other  Erythema: no back redness   Right Hand Exam   Tenderness  The patient is experiencing no tenderness.   Tests   Tinel's sign (median nerve): positive  Other  Erythema: absent      Patient is alert, oriented, no adenopathy, well-dressed, normal affect, normal respiratory effort.   Imaging: No results found. No images are attached to the encounter.  Labs: Lab Results  Component Value Date   HGBA1C 5.6 12/07/2015   ESRSEDRATE 2 11/19/2008   REPTSTATUS 05/01/2019 FINAL 04/30/2019   CULT (A) 04/30/2019    <10,000 COLONIES/mL INSIGNIFICANT GROWTH Performed at [facility], [address] [place], [place] [address]      Lab Results  Component Value Date   ALBUMIN 4.4 06/10/2019   ALBUMIN 3.8 04/30/2019   ALBUMIN 3.9 11/28/2018    Lab Results  Component Value Date   MG 1.9 11/19/2008   MG 2.0 03/31/2007   Lab Results  Component Value Date   VD25OH 49 12/11/2008    No results found for: PREALBUMIN CBC EXTENDED Latest Ref Rng & Units 07/15/2019 04/30/2019 11/28/2018  WBC 4.0 - 10.5 K/uL 6.6 18.5(H) 7.5  RBC 3.87 - 5.11 MIL/uL 4.00 4.04 3.89  HGB 12.0 - 15.0 g/dL 62.2 29.7 98.9  HCT 21.1 - 46.0 % 37.2 36.5 35.6(L)  PLT 150 - 400 K/uL 189 185 147(L)  NEUTROABS 1.7 - 7.7 K/uL - 15.1(H) 4.4  LYMPHSABS  0.7 - 4.0 K/uL - 1.0 1.8     There is no height or weight on file to calculate BMI.  Orders:  Orders Placed This Encounter  Procedures  . Ambulatory referral to Physical Medicine Rehab   No orders of the defined types were placed in this encounter.    Procedures: No procedures performed  Clinical Data: No additional findings.  ROS:  All other systems negative, except as noted in the HPI. Review of Systems  Objective: Vital Signs: There were no vitals taken for this visit.  Specialty Comments:  No specialty comments available.  PMFS History: Patient Active Problem List   Diagnosis Date Noted  . Chest pain 11/28/2018  . Chest pain on breathing 12/06/2015  . Exertional chest pain   . COUGH 06/24/2009  . HYPOPOTASSEMIA 12/31/2008  . Pure  hypercholesterolemia 12/16/2008  . Hyperlipidemia, mixed 08/28/2008  . HYPERTENSION, BENIGN 08/28/2008  . S/P CABG x 3 08/28/2008  . GERD 10/16/2007   Past Medical History:  Diagnosis Date  . Anemia   . Coronary artery disease   . Diabetes mellitus   . DJD (degenerative joint disease)   . Dysrhythmia   . Edema   . GERD (gastroesophageal reflux disease)   . Hiatal hernia   . History of blood transfusion   . Hyperlipidemia   . Hypertension   . Hypothyroidism   . IBS (irritable bowel syndrome)   . SVT (supraventricular tachycardia) (HCC)    with ablation    Family History  Problem Relation Age of Onset  . Heart attack Mother 68       died  . Heart attack Father 2       died  . Breast cancer Sister   . Diabetes Sister        and brother  . Heart disease Other        sister, father and brother  . Coronary artery disease Brother        PCI    Past Surgical History:  Procedure Laterality Date  . APPENDECTOMY    . BREAST SURGERY     biopsy per left breast   . CARDIAC CATHETERIZATION    . CHOLECYSTECTOMY  2005  . CORONARY ARTERY BYPASS GRAFT  2000  . EYE SURGERY     cataract surgery bilat   . REPLACEMENT TOTAL KNEE  2009   left   Social History   Occupational History  . Occupation: retired    Associate Professor: RETIRED  Tobacco Use  . Smoking status: Never Smoker  . Smokeless tobacco: Never Used  Substance and Sexual Activity  . Alcohol use: No  . Drug use: No  . Sexual activity: Not on file

## 2020-07-19 ENCOUNTER — Other Ambulatory Visit: Payer: Self-pay | Admitting: Internal Medicine

## 2020-07-21 ENCOUNTER — Ambulatory Visit (INDEPENDENT_AMBULATORY_CARE_PROVIDER_SITE_OTHER): Payer: Medicare Other | Admitting: Physical Medicine and Rehabilitation

## 2020-07-21 ENCOUNTER — Encounter: Payer: Self-pay | Admitting: Physical Medicine and Rehabilitation

## 2020-07-21 ENCOUNTER — Ambulatory Visit: Payer: Self-pay

## 2020-07-21 VITALS — BP 135/87 | HR 82

## 2020-07-21 DIAGNOSIS — M5416 Radiculopathy, lumbar region: Secondary | ICD-10-CM

## 2020-07-21 DIAGNOSIS — M48062 Spinal stenosis, lumbar region with neurogenic claudication: Secondary | ICD-10-CM | POA: Diagnosis not present

## 2020-07-21 MED ORDER — METHYLPREDNISOLONE ACETATE 80 MG/ML IJ SUSP
80.0000 mg | Freq: Once | INTRAMUSCULAR | Status: AC
  Administered 2020-07-21: 80 mg

## 2020-07-21 NOTE — Procedures (Signed)
Lumbosacral Transforaminal Epidural Steroid Injection - Sub-Pedicular Approach with Fluoroscopic Guidance  Patient: [name]      Date of Birth: October 12, 1932 MRN: [id number] PCP: Jarome Matin, MD      Visit Date: 07/21/2020   Universal Protocol:    Date/Time: 07/21/2020  Consent Given By: the patient  Position: PRONE  Additional Comments: Vital signs were monitored before and after the procedure. Patient was prepped and draped in the usual sterile fashion. The correct patient, procedure, and site was verified.   Injection Procedure Details:   Procedure diagnoses: Lumbar radiculopathy [M54.16]    Meds Administered:  Meds ordered this encounter  Medications  . methylPREDNISolone acetate (DEPO-MEDROL) injection 80 mg    Laterality: Bilateral  Location/Site:  L4-L5  Needle:5.0 in., 22 ga.  Short bevel or Quincke spinal needle  Needle Placement: Transforaminal  Findings:    -Comments: Excellent flow of contrast along the nerve, nerve root and into the epidural space.  Procedure Details: After squaring off the end-plates to get a true AP view, the C-arm was positioned so that an oblique view of the foramen as noted above was visualized. The target area is just inferior to the "nose of the scotty dog" or sub pedicular. The soft tissues overlying this structure were infiltrated with 2-3 ml. of 1% Lidocaine without Epinephrine.  The spinal needle was inserted toward the target using a "trajectory" view along the fluoroscope beam.  Under AP and lateral visualization, the needle was advanced so it did not puncture dura and was located close the 6 O'Clock position of the pedical in AP tracterory. Biplanar projections were used to confirm position. Aspiration was confirmed to be negative for CSF and/or blood. A 1-2 ml. volume of Isovue-250 was injected and flow of contrast was noted at each level. Radiographs were obtained for documentation purposes.   After attaining the  desired flow of contrast documented above, a 0.5 to 1.0 ml test dose of 0.25% Marcaine was injected into each respective transforaminal space.  The patient was observed for 90 seconds post injection.  After no sensory deficits were reported, and normal lower extremity motor function was noted,   the above injectate was administered so that equal amounts of the injectate were placed at each foramen (level) into the transforaminal epidural space.   Additional Comments:  The patient tolerated the procedure well Dressing: 2 x 2 sterile gauze and Band-Aid    Post-procedure details: Patient was observed during the procedure. Post-procedure instructions were reviewed.  Patient left the clinic in stable condition.

## 2020-07-21 NOTE — Progress Notes (Signed)
[name] - 87 y.o. female MRN [id number]  Date of birth: Jul 14, 1932  Office Visit Note: Visit Date: 07/21/2020 PCP: Jarome Matin, MD Referred by: Jarome Matin, MD  Subjective: Chief Complaint  Patient presents with  . Lower Back - Pain  . Left Leg - Pain  . Right Leg - Pain   HPI:  [name] is a 87 y.o. female who comes in today for planned Bilateral L4-L5 Lumbar Transforaminal epidural steroid injection with fluoroscopic guidance.  The patient has failed conservative care including home exercise, medications, time and activity modification.  This injection will be diagnostic and hopefully therapeutic.  Please see requesting physician notes for further details and justification.   ROS Otherwise per HPI.  Assessment & Plan: Visit Diagnoses:    ICD-10-CM   1. Lumbar radiculopathy  M54.16 XR C-ARM NO REPORT    Epidural Steroid injection    methylPREDNISolone acetate (DEPO-MEDROL) injection 80 mg  2. Spinal stenosis of lumbar region with neurogenic claudication  M48.062 XR C-ARM NO REPORT    Epidural Steroid injection    methylPREDNISolone acetate (DEPO-MEDROL) injection 80 mg    Plan: No additional findings.   Meds & Orders:  Meds ordered this encounter  Medications  . methylPREDNISolone acetate (DEPO-MEDROL) injection 80 mg    Orders Placed This Encounter  Procedures  . XR C-ARM NO REPORT  . Epidural Steroid injection    Follow-up: Return if symptoms worsen or fail to improve.   Procedures: No procedures performed  Lumbosacral Transforaminal Epidural Steroid Injection - Sub-Pedicular Approach with Fluoroscopic Guidance  Patient: [name]      Date of Birth: 1932/11/30 MRN: [id number] PCP: Jarome Matin, MD      Visit Date: 07/21/2020   Universal Protocol:    Date/Time: 07/21/2020  Consent Given By: the patient  Position: PRONE  Additional Comments: Vital signs were monitored before and after the procedure. Patient was prepped  and draped in the usual sterile fashion. The correct patient, procedure, and site was verified.   Injection Procedure Details:   Procedure diagnoses: Lumbar radiculopathy [M54.16]    Meds Administered:  Meds ordered this encounter  Medications  . methylPREDNISolone acetate (DEPO-MEDROL) injection 80 mg    Laterality: Bilateral  Location/Site:  L4-L5  Needle:5.0 in., 22 ga.  Short bevel or Quincke spinal needle  Needle Placement: Transforaminal  Findings:    -Comments: Excellent flow of contrast along the nerve, nerve root and into the epidural space.  Procedure Details: After squaring off the end-plates to get a true AP view, the C-arm was positioned so that an oblique view of the foramen as noted above was visualized. The target area is just inferior to the "nose of the scotty dog" or sub pedicular. The soft tissues overlying this structure were infiltrated with 2-3 ml. of 1% Lidocaine without Epinephrine.  The spinal needle was inserted toward the target using a "trajectory" view along the fluoroscope beam.  Under AP and lateral visualization, the needle was advanced so it did not puncture dura and was located close the 6 O'Clock position of the pedical in AP tracterory. Biplanar projections were used to confirm position. Aspiration was confirmed to be negative for CSF and/or blood. A 1-2 ml. volume of Isovue-250 was injected and flow of contrast was noted at each level. Radiographs were obtained for documentation purposes.   After attaining the desired flow of contrast documented above, a 0.5 to 1.0 ml test dose of 0.25% Marcaine was injected into each respective  transforaminal space.  The patient was observed for 90 seconds post injection.  After no sensory deficits were reported, and normal lower extremity motor function was noted,   the above injectate was administered so that equal amounts of the injectate were placed at each foramen (level) into the transforaminal epidural  space.   Additional Comments:  The patient tolerated the procedure well Dressing: 2 x 2 sterile gauze and Band-Aid    Post-procedure details: Patient was observed during the procedure. Post-procedure instructions were reviewed.  Patient left the clinic in stable condition.      Clinical History: MRI LUMBAR SPINE WITHOUT AND WITH CONTRAST  TECHNIQUE: Multiplanar and multiecho pulse sequences of the lumbar spine were obtained without and with intravenous contrast.  CONTRAST:  77mL GADAVIST GADOBUTROL 1 MMOL/ML IV SOLN  COMPARISON:  None.  FINDINGS: Significant motion artifact is present.  Segmentation:  Standard.  Alignment: Grade 1 anterolisthesis at L4-L5 mild retrolisthesis at L1-L2 and L3-L4.  Vertebrae: Vertebral body heights are maintained apart from degenerative endplate irregularity, greatest at L3. There is minor degenerative endplate marrow edema, for example at L5-S1. No suspicious osseous lesion. No evidence of discitis or osteomyelitis.  Conus medullaris and cauda equina: Conus extends to the L1 level. Conus is unremarkable. There is possible reactive nerve root enhancement at the L4-L5 level.  Paraspinal and other soft tissues: Partially image distended bladder. Small calcified uterine fibroid. Otherwise unremarkable.  Disc levels:  L1-L2: Disc bulge and mild facet arthropathy. No significant canal or foraminal stenosis.  L2-L3: Disc bulge and mild facet arthropathy with ligamentum flavum infolding. No significant canal or foraminal stenosis.  L3-L4: Disc bulge with endplate osteophytic ridging and moderate facet arthropathy with ligamentum flavum infolding. Moderate canal stenosis with narrowing of the lateral recesses. Mild foraminal stenosis.  L4-L5: Anterolisthesis with uncovering of disc bulge. Moderate right and marked left facet arthropathy with ligamentum flavum infolding. Severe canal stenosis with effacement of the  lateral recesses and crowding of the cauda equina. Mild to moderate right and moderate left foraminal stenosis.  L5-S1: Disc bulge with endplate osteophytic ridging eccentric to the left and moderate facet arthropathy with ligamentum flavum infolding. No significant canal or right foraminal stenosis. Mild left foraminal stenosis.  IMPRESSION: Motion degraded study.  No evidence of discitis/osteomyelitis.  No epidural collection  Multilevel degenerative changes as detailed above. Most notably, there is severe canal stenosis at L4-L5 with crowding of the cauda equina.   Electronically Signed   By: Guadlupe Spanish M.D.   On: 04/30/2019 19:16     Objective:  VS:  HT:    WT:   BMI:     BP:135/87  HR:82bpm  TEMP: ( )  RESP:  Physical Exam Vitals and nursing note reviewed.  Constitutional:      General: She is not in acute distress.    Appearance: Normal appearance. She is not ill-appearing.  HENT:     Head: Normocephalic and atraumatic.     Right Ear: External ear normal.     Left Ear: External ear normal.  Eyes:     Extraocular Movements: Extraocular movements intact.  Cardiovascular:     Rate and Rhythm: Normal rate.     Pulses: Normal pulses.  Pulmonary:     Effort: Pulmonary effort is normal. No respiratory distress.  Abdominal:     General: There is no distension.     Palpations: Abdomen is soft.  Musculoskeletal:        General: Tenderness present.  Cervical back: Neck supple.     Right lower leg: No edema.     Left lower leg: No edema.     Comments: Patient has good distal strength with no pain over the greater trochanters.  No clonus or focal weakness.  Skin:    Findings: No erythema, lesion or rash.  Neurological:     General: No focal deficit present.     Mental Status: She is alert and oriented to person, place, and time.     Sensory: No sensory deficit.     Motor: No weakness or abnormal muscle tone.     Coordination: Coordination normal.   Psychiatric:        Mood and Affect: Mood normal.        Behavior: Behavior normal.      Imaging: No results found.

## 2020-07-21 NOTE — Patient Instructions (Signed)

## 2020-07-21 NOTE — Progress Notes (Signed)
Pt state lower back pain travels down both legs. Pt state when sitting for a long time and get up for that first step the pain gets worse. Pt state she takes over the counter pain meds. Pt has hx of inj on 02/09/20 pt state it helped.  Numeric Pain Rating Scale and Functional Assessment Average Pain 6   In the last MONTH (on 0-10 scale) has pain interfered with the following?  1. General activity like being  able to carry out your everyday physical activities such as walking, climbing stairs, carrying groceries, or moving a chair?  Rating(10)   +Driver, -BT, -Dye Allergies.

## 2020-07-30 ENCOUNTER — Ambulatory Visit: Payer: Medicare Other | Admitting: Internal Medicine

## 2020-07-30 ENCOUNTER — Other Ambulatory Visit: Payer: Self-pay

## 2020-07-30 ENCOUNTER — Encounter: Payer: Self-pay | Admitting: Internal Medicine

## 2020-07-30 VITALS — BP 138/64 | HR 79 | Ht 62.0 in | Wt 132.0 lb

## 2020-07-30 DIAGNOSIS — E782 Mixed hyperlipidemia: Secondary | ICD-10-CM | POA: Diagnosis not present

## 2020-07-30 DIAGNOSIS — I2510 Atherosclerotic heart disease of native coronary artery without angina pectoris: Secondary | ICD-10-CM | POA: Diagnosis not present

## 2020-07-30 NOTE — Patient Instructions (Signed)
Medication Instructions:  No changes *If you need a refill on your cardiac medications before your next appointment, please call your pharmacy*   Lab Work: Today: cbc, bmet, lipid, tsh  If you have labs (blood work) drawn today and your tests are completely normal, you will receive your results only by: Marland Kitchen MyChart Message (if you have MyChart) OR . A paper copy in the mail If you have any lab test that is abnormal or we need to change your treatment, we will call you to review the results.   Testing/Procedures: none   Follow-Up: At [facility], you and your health needs are our priority.  As part of our continuing mission to provide you with exceptional heart care, we have created designated Provider Care Teams.  These Care Teams include your primary Cardiologist (physician) and Advanced Practice Providers (APPs -  Physician Assistants and Nurse Practitioners) who all work together to provide you with the care you need, when you need it.  We recommend signing up for the patient portal called "MyChart".  Sign up information is provided on this After Visit Summary.  MyChart is used to connect with patients for Virtual Visits (Telemedicine).  Patients are able to view lab/test results, encounter notes, upcoming appointments, etc.  Non-urgent messages can be sent to your provider as well.   To learn more about what you can do with MyChart, go to [email or web address].    Your next appointment:   9 month(s)  The format for your next appointment:   In Person  Provider:   You may see Dietrich Pates, MD or one of the following Advanced Practice Providers on your designated Care Team:    Tereso Newcomer, PA-C  Chelsea Aus, [place]    Other Instructions

## 2020-07-30 NOTE — Progress Notes (Signed)
.    Cardiology Office Note   Date:  07/30/2020   ID:  [name], DOB 10/10/1932, MRN [id number]  PCP:  Jarome Matin, MD  Cardiologist:   Dietrich Pates, MD    F/U of CAD     History of Present Illness: [name] is a 87 y.o. female with a history of CAD (s/p CABG in 2000.  Myoview in 2013 was normal.  In 2016 admitted with CP   Myovue again done,  Normal Echo in 2020:   Normal LV function  Oct 2020   Admitted again with chest tightness  Myovue showed no ischemia      I saw the pt in Dec 2020   She was seen by Wende Mott in March 2021 Since seen the pt denies CP   Breathing is OK   Does note occasional gas.    Pt says she is  having signif problems with back   Seen by F. Odette Fraction     Outpatient Medications Prior to Visit  Medication Sig Dispense Refill  . acetaminophen (TYLENOL) 325 MG tablet Take 2 tablets (650 mg total) by mouth every 6 (six) hours as needed for up to 30 doses for mild pain or moderate pain. 30 tablet 0  . amLODipine (NORVASC) 10 MG tablet TAKE 1 TABLET BY MOUTH EVERY DAY 90 tablet 2  . aspirin 81 MG tablet Take 81 mg by mouth daily.    . calcium carbonate (TUMS - DOSED IN MG ELEMENTAL CALCIUM) 500 MG chewable tablet Chew 1 tablet by mouth as needed for indigestion or heartburn.    . ergocalciferol (VITAMIN D2) 50000 UNITS capsule Take 50,000 Units by mouth once a week.    . esomeprazole (NEXIUM) 20 MG capsule Take 20 mg by mouth daily before breakfast.    . ezetimibe (ZETIA) 10 MG tablet TAKE 1/2 TABLET BY MOUTH DAILY 45 tablet 2  . furosemide (LASIX) 20 MG tablet TAKE 1 TABLET BY MOUTH EVERY DAY 90 tablet 3  . KLOR-CON M20 20 MEQ tablet TAKE 1 TABLET BY MOUTH EVERY DAY 90 tablet 3  . levothyroxine (SYNTHROID, LEVOTHROID) 75 MCG tablet Take 75 mcg by mouth daily.    Marland Kitchen lidocaine (LIDODERM) 5 % PLACE 1 PATCH ONTO THE SKIN DAILY. REMOVE & DISCARD PATCH WITHIN 12 HOURS OR AS DIRECTED BY MD 30 patch 2  . lisinopril (ZESTRIL) 10 MG tablet TAKE 1 TABLET  BY MOUTH EVERY DAY 90 tablet 2  . methocarbamol (ROBAXIN) 500 MG tablet Take 1 tablet (500 mg total) by mouth 3 (three) times daily. 30 tablet 0  . metoprolol succinate (TOPROL-XL) 25 MG 24 hr tablet TAKE 1 TABLET BY MOUTH EVERY DAY 90 tablet 3  . nitroGLYCERIN (NITROSTAT) 0.4 MG SL tablet Place 1 tablet (0.4 mg total) under the tongue every 5 (five) minutes x 3 doses as needed for chest pain. 25 tablet 2  . ONETOUCH VERIO test strip CHECK BLOOD SUGARS ONCE DAILY    . predniSONE (DELTASONE) 10 MG tablet Take 1 tablet (10 mg total) by mouth daily with breakfast. 30 tablet 0  . rosuvastatin (CRESTOR) 10 MG tablet TAKE 1 TABLET BY MOUTH EVERY DAY 90 tablet 3  . traMADol (ULTRAM) 50 MG tablet Take 1 tablet (50 mg total) by mouth every 6 (six) hours as needed. 30 tablet 0   No facility-administered medications prior to visit.     Allergies:   Actos [pioglitazone hydrochloride], Codeine, and Lipitor [atorvastatin]   Past  Medical History:  Diagnosis Date  . Anemia   . Coronary artery disease   . Diabetes mellitus   . DJD (degenerative joint disease)   . Dysrhythmia   . Edema   . GERD (gastroesophageal reflux disease)   . Hiatal hernia   . History of blood transfusion   . Hyperlipidemia   . Hypertension   . Hypothyroidism   . IBS (irritable bowel syndrome)   . SVT (supraventricular tachycardia) (HCC)    with ablation    Past Surgical History:  Procedure Laterality Date  . APPENDECTOMY    . BREAST SURGERY     biopsy per left breast   . CARDIAC CATHETERIZATION    . CHOLECYSTECTOMY  2005  . CORONARY ARTERY BYPASS GRAFT  2000  . EYE SURGERY     cataract surgery bilat   . REPLACEMENT TOTAL KNEE  2009   left     Social History:  The patient  reports that she has never smoked. She has never used smokeless tobacco. She reports that she does not drink alcohol and does not use drugs.   Family History:  The patient's family history includes Breast cancer in her sister; Coronary artery  disease in her brother; Diabetes in her sister; Heart attack (age of onset: 26) in her father; Heart attack (age of onset: 50) in her mother; Heart disease in an other family member.    ROS:  Please see the history of present illness. All other systems are reviewed and  Negative to the above problem except as noted.    PHYSICAL EXAM: VS:  BP 138/64   Pulse 79   Ht 5\' 2"  (1.575 m)   Wt 132 lb (59.9 kg)   SpO2 98%   BMI 24.14 kg/m   GEN: Well nourished, well developed, in no acute distress  HEENT: normal  Neck: JVP is normal  Cardiac: RRR;  S1, S2  No S3  No murmurs  Tr LE   edema  Respiratory:  clear to auscultation bilaterally, normal work of breathing GI: soft, nontender, nondistended, + BS  No hepatomegaly  MS: no deformity Moving all extremities   Skin: warm and dry, no rash Neuro:  Strength and sensation are intact Psych: euthymic mood, full affect   EKG:  EKG is  ordered today    Not done    Lipid Panel    Component Value Date/Time   CHOL 156 06/10/2019 0856   TRIG 110 06/10/2019 0856   HDL 83 06/10/2019 0856   CHOLHDL 1.9 06/10/2019 0856   CHOLHDL 2.6 12/07/2015 0440   VLDL 24 12/07/2015 0440   LDLCALC 54 06/10/2019 0856      Wt Readings from Last 3 Encounters:  07/30/20 132 lb (59.9 kg)  12/22/19 135 lb (61.2 kg)  11/24/19 135 lb (61.2 kg)      ASSESSMENT AND PLAN:  1  CAD   Remote CABG  Myoview in 2020 without ischemia  Pt currently denies symptoms     2  HL Pt now on Crestor   LDL in Aug 2021 was 58  Keep on same meds .  Check today    4  HTN  BP is controlled for age     Keep on same meds .  Check:  CBC, BMET, Lipids and TSH   Current medicines are reviewed at length with the patient today.  The patient does not have concerns regarding medicines.  Signed, Sep 2021, MD  07/30/2020 1:08 PM    [facility] [address], [place], [place]  [address] Phone: [phone]; Fax: [phone]

## 2020-07-31 LAB — BASIC METABOLIC PANEL
BUN/Creatinine Ratio: 18 (ref 12–28)
BUN: 15 mg/dL (ref 8–27)
CO2: 23 mmol/L (ref 20–29)
Calcium: 9.3 mg/dL (ref 8.7–10.3)
Chloride: 96 mmol/L (ref 96–106)
Creatinine, Ser: 0.84 mg/dL (ref 0.57–1.00)
Glucose: 97 mg/dL (ref 65–99)
Potassium: 4.5 mmol/L (ref 3.5–5.2)
Sodium: 133 mmol/L — ABNORMAL LOW (ref 134–144)
eGFR: 68 mL/min/{1.73_m2} (ref >59)

## 2020-07-31 LAB — LIPID PANEL
Chol/HDL Ratio: 1.8 ratio (ref 0.0–4.4)
Cholesterol, Total: 151 mg/dL (ref 100–199)
HDL: 82 mg/dL (ref >39)
LDL Chol Calc (NIH): 47 mg/dL (ref 0–99)
Triglycerides: 134 mg/dL (ref 0–149)
VLDL Cholesterol Cal: 22 mg/dL (ref 5–40)

## 2020-07-31 LAB — CBC
Hematocrit: 38 % (ref 34.0–46.6)
Hemoglobin: 13.2 g/dL (ref 11.1–15.9)
MCH: 31.6 pg (ref 26.6–33.0)
MCHC: 34.7 g/dL (ref 31.5–35.7)
MCV: 91 fL (ref 79–97)
Platelets: 208 10*3/uL (ref 150–450)
RBC: 4.18 x10E6/uL (ref 3.77–5.28)
RDW: 14.1 % (ref 11.7–15.4)
WBC: 9.9 10*3/uL (ref 3.4–10.8)

## 2020-07-31 LAB — TSH: TSH: 1.01 u[IU]/mL (ref 0.450–4.500)

## 2020-08-10 ENCOUNTER — Encounter: Payer: Self-pay | Admitting: *Deleted

## 2020-08-18 IMAGING — CR DG CHEST 2V
2 series · 2 of 2 positions shown · non-contrast
Comparison: Chest radiographs 11/28/2018 and earlier.

CLINICAL DATA: 86-year-old female with chest pain for 1 day.

EXAM:
CHEST - 2 VIEW

[chest pa]
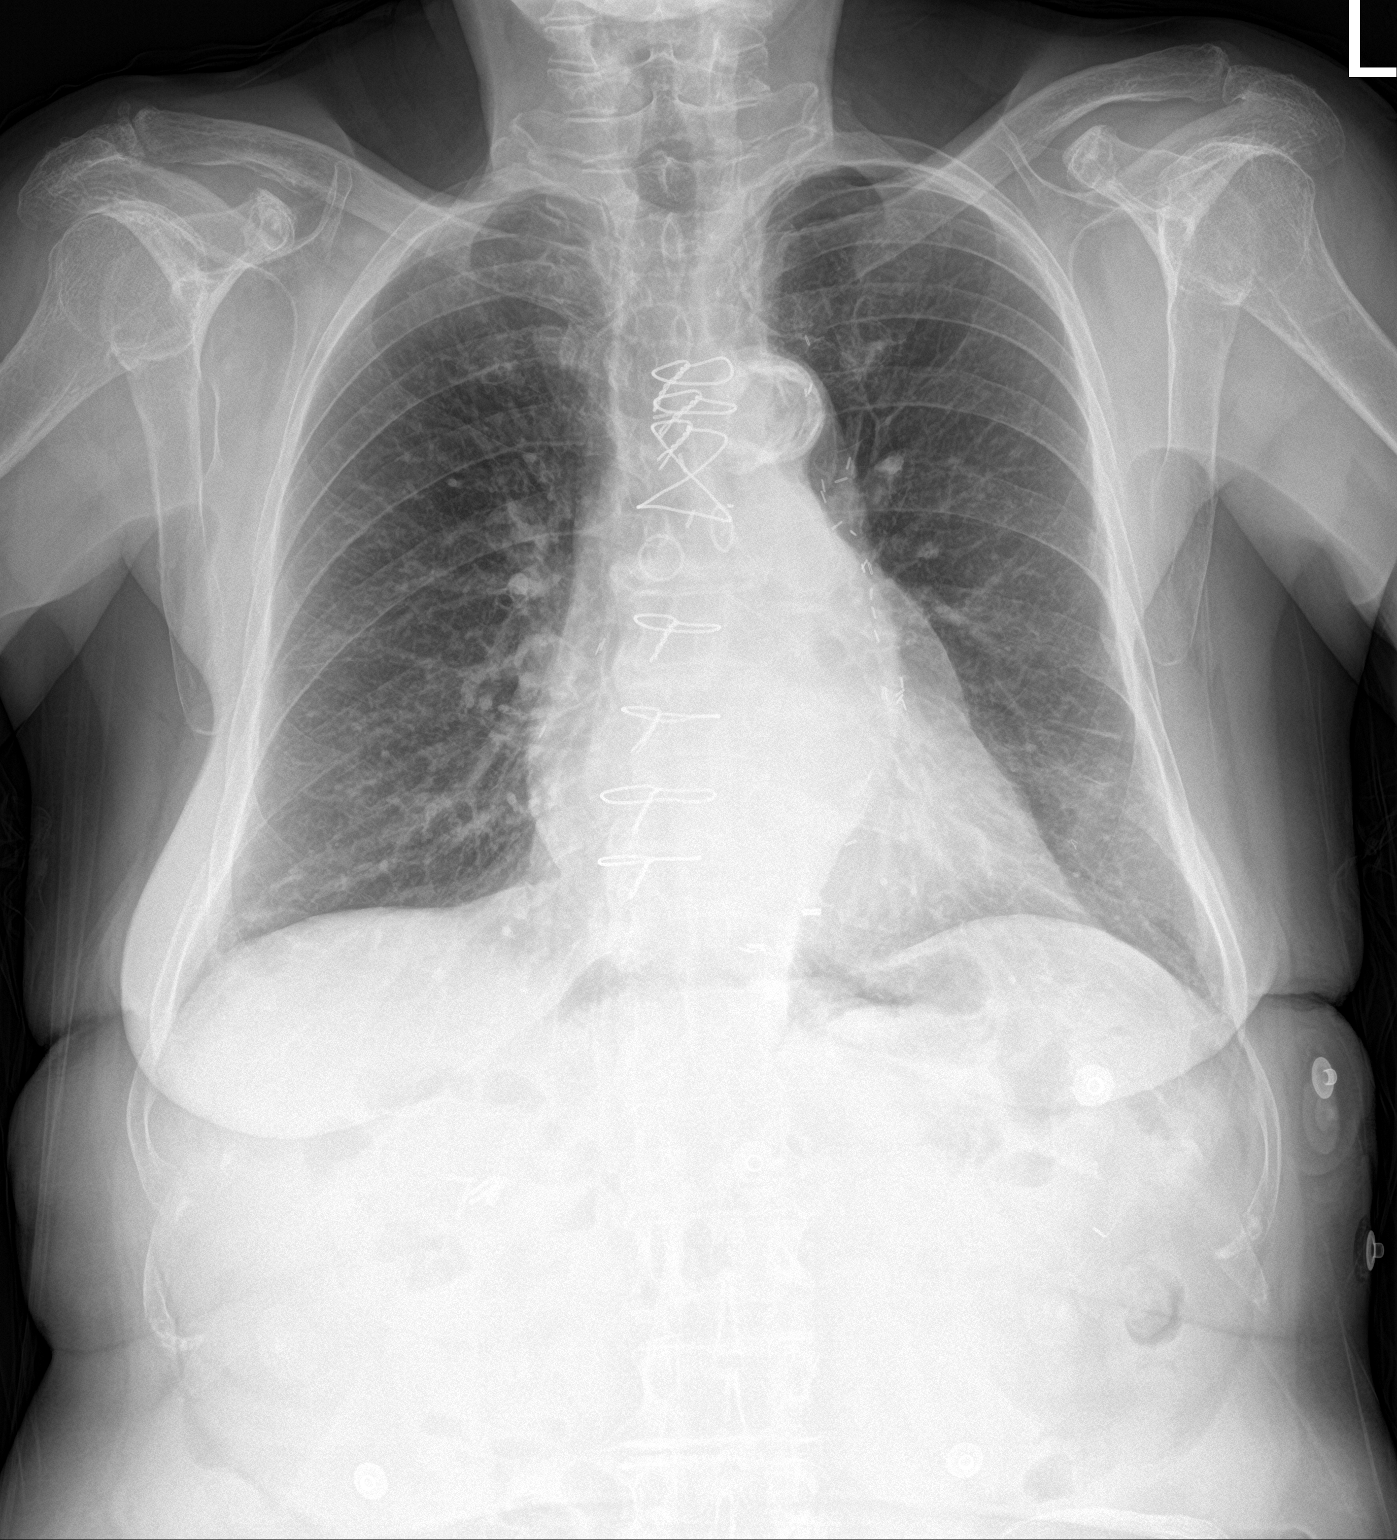

[chest lat]
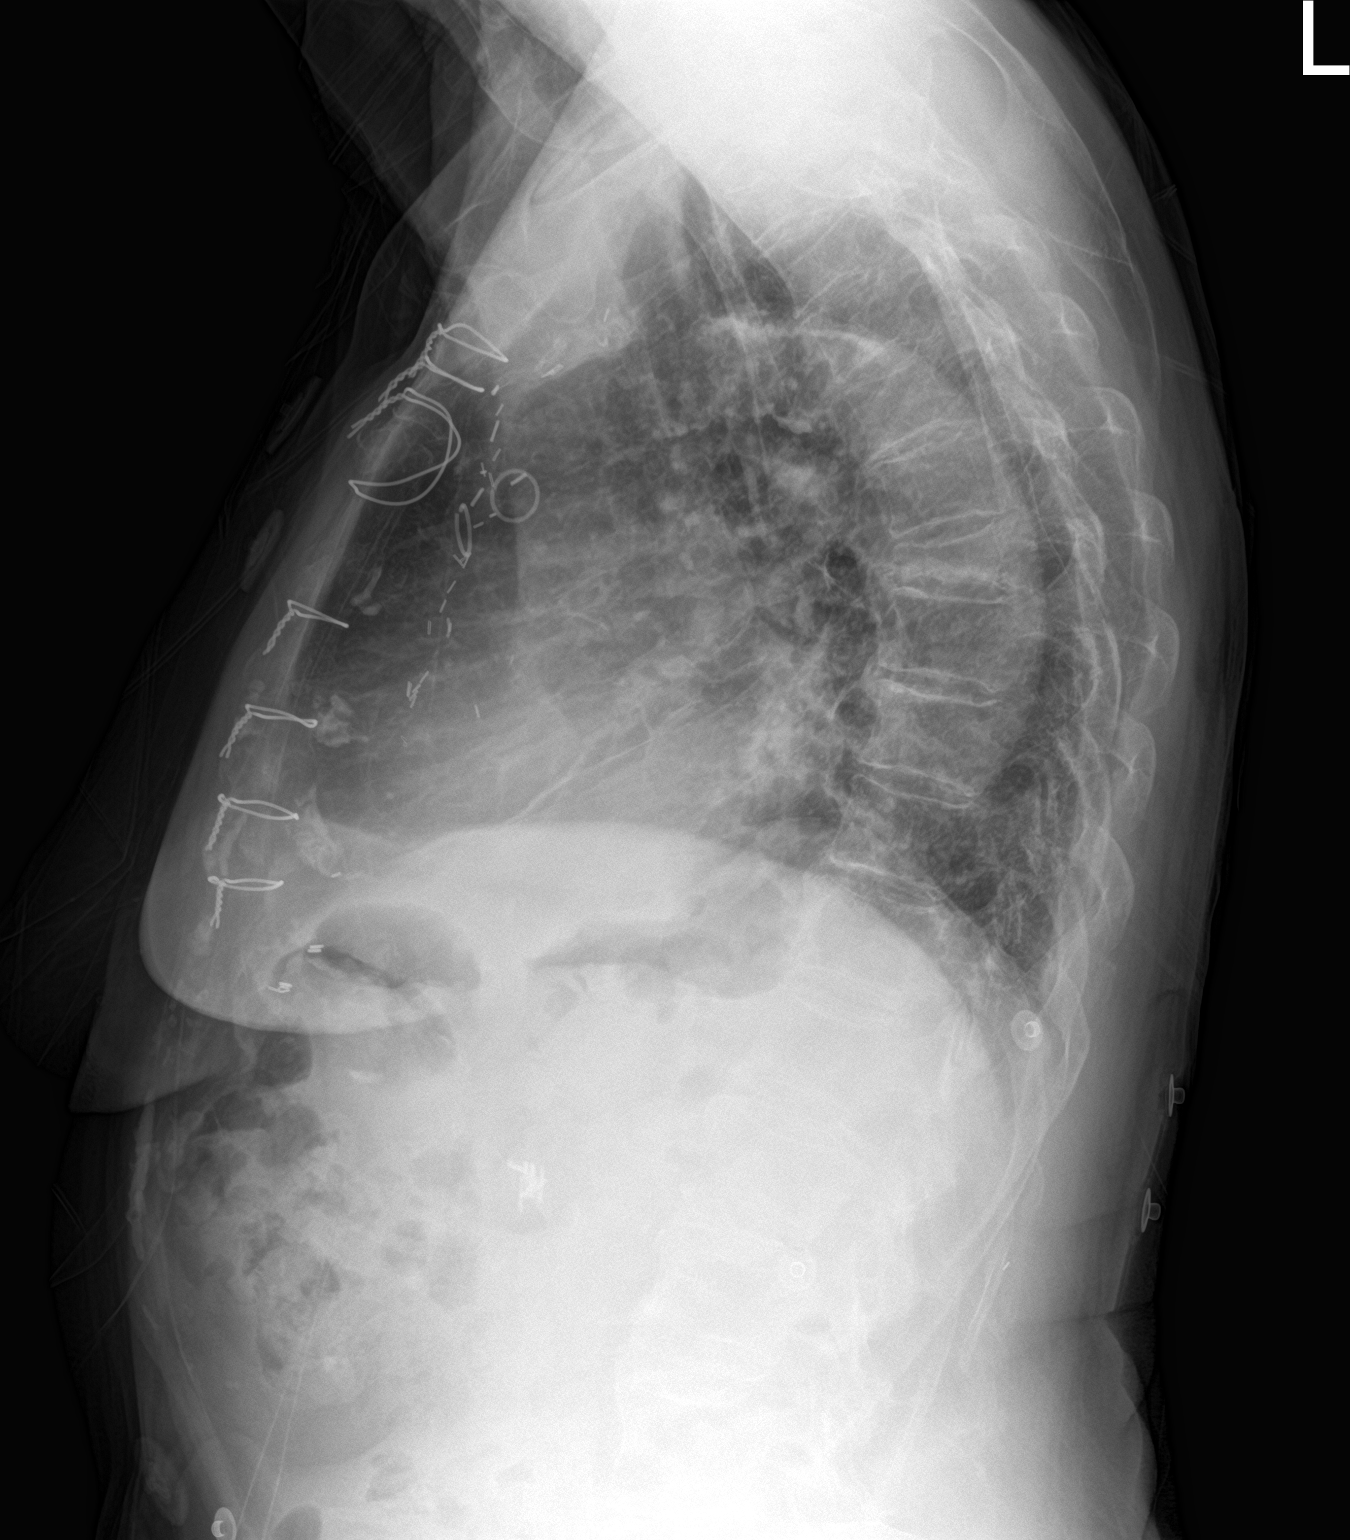

[2 of 2 positions shown; findings below may reference images not displayed]

FINDINGS: Chronic cardiomegaly, tortuosity of the thoracic aorta with
calcified atherosclerosis. Prior CABG. Stable cardiac size and
mediastinal contours. Stable lung volumes. No pneumothorax,
pulmonary edema, pleural effusion or confluent pulmonary opacity.

A midthoracic compression fracture is new since [REDACTED]. Other
visible osseous structures appear stable. Stable cholecystectomy
clips. Negative visible bowel gas pattern.
IMPRESSION: 1. No acute cardiopulmonary abnormality. Chronic cardiomegaly and
Aortic Atherosclerosis (ENUNR-DMT.T).

2. Midthoracic compression fracture is new since [REDACTED].
If there is midthoracic back pain and specific therapy such as
vertebroplasty is desired, noncontrast Thoracic MRI or Nuclear
Medicine Whole-body Bone Scan would best determine acuity.

## 2020-09-08 ENCOUNTER — Other Ambulatory Visit: Payer: Self-pay

## 2020-09-08 ENCOUNTER — Ambulatory Visit (INDEPENDENT_AMBULATORY_CARE_PROVIDER_SITE_OTHER): Payer: Medicare Other | Admitting: Physical Medicine and Rehabilitation

## 2020-09-08 ENCOUNTER — Encounter: Payer: Self-pay | Admitting: Physical Medicine and Rehabilitation

## 2020-09-08 DIAGNOSIS — R202 Paresthesia of skin: Secondary | ICD-10-CM | POA: Diagnosis not present

## 2020-09-08 NOTE — Progress Notes (Signed)
Tingling and cramping in right hand. Symptoms have improved over the last few weeks. Right hand dominant +Lotion

## 2020-09-13 NOTE — Procedures (Signed)
EMG & NCV Findings: Evaluation of the right median motor nerve showed prolonged distal onset latency (5.8 ms) and decreased conduction velocity (Elbow-Wrist, 46 m/s).  The right median (across palm) sensory nerve showed prolonged distal peak latency (Wrist, 5.5 ms) and prolonged distal peak latency (Palm, 11.3 ms).  The right ulnar sensory nerve showed prolonged distal peak latency (4.0 ms) and decreased conduction velocity (Wrist-5th Digit, 35 m/s).  All remaining nerves (as indicated in the following tables) were within normal limits.    All examined muscles (as indicated in the following table) showed no evidence of electrical instability.    Impression: The above electrodiagnostic study is ABNORMAL and reveals evidence of a moderate right median nerve entrapment at the wrist (carpal tunnel syndrome) affecting sensory and motor components. There is no significant electrodiagnostic evidence of any other focal nerve entrapment, brachial plexopathy or cervical radiculopathy.   Recommendations: 1.  Follow-up with referring physician. 2.  Continue current management of symptoms. 3.  Continue use of resting splint at night-time and as needed during the day.  Consider diagnostic and therapeutic injection.  ___________________________ Naaman Plummer FAAPMR Board Certified, American Board of Physical Medicine and Rehabilitation    Nerve Conduction Studies Anti Sensory Summary Table   Stim Site NR Peak (ms) Norm Peak (ms) P-T Amp (V) Norm P-T Amp Site1 Site2 Delta-P (ms) Dist (cm) Vel (m/s) Norm Vel (m/s)  Right Median Acr Palm Anti Sensory (2nd Digit)  30C  Wrist    *5.5 <3.6 11.4 >10 Wrist Palm 5.8 0.0    Palm    *11.3 <2.0 10.2         Right Radial Anti Sensory (Base 1st Digit)  31.1C  Wrist    2.5 <3.1 28.4  Wrist Base 1st Digit 2.5 0.0    Right Ulnar Anti Sensory (5th Digit)  31C  Wrist    *4.0 <3.7 20.7 >15.0 Wrist 5th Digit 4.0 14.0 *35 >38   Motor Summary Table   Stim Site NR Onset  (ms) Norm Onset (ms) O-P Amp (mV) Norm O-P Amp Site1 Site2 Delta-0 (ms) Dist (cm) Vel (m/s) Norm Vel (m/s)  Right Median Motor (Abd Poll Brev)  31.4C  Wrist    *5.8 <4.2 6.1 >5 Elbow Wrist 4.6 21.0 *46 >50  Elbow    10.4  5.9         Right Ulnar Motor (Abd Dig Min)  31.7C  Wrist    3.4 <4.2 9.2 >3 B Elbow Wrist 3.2 17.0 53 >53  B Elbow    6.6  7.9  A Elbow B Elbow 1.4 10.0 71 >53  A Elbow    8.0  8.0          EMG   Side Muscle Nerve Root Ins Act Fibs Psw Amp Dur Poly Recrt Int Dennie Bible Comment  Right Abd Poll Brev Median C8-T1 Nml Nml Nml Nml Nml 0 Nml Nml   Right 1stDorInt Ulnar C8-T1 Nml Nml Nml Nml Nml 0 Nml Nml   Right PronatorTeres Median C6-7 Nml Nml Nml Nml Nml 0 Nml Nml   Right Biceps Musculocut C5-6 Nml Nml Nml Nml Nml 0 Nml Nml   Right Deltoid Axillary C5-6 Nml Nml Nml Nml Nml 0 Nml Nml     Nerve Conduction Studies Anti Sensory Left/Right Comparison   Stim Site L Lat (ms) R Lat (ms) L-R Lat (ms) L Amp (V) R Amp (V) L-R Amp (%) Site1 Site2 L Vel (m/s) R Vel (m/s) L-R Vel (m/s)  Median Acr  Palm Anti Sensory (2nd Digit)  30C  Wrist  *5.5   11.4  Wrist Palm     Palm  *11.3   10.2        Radial Anti Sensory (Base 1st Digit)  31.1C  Wrist  2.5   28.4  Wrist Base 1st Digit     Ulnar Anti Sensory (5th Digit)  31C  Wrist  *4.0   20.7  Wrist 5th Digit  *35    Motor Left/Right Comparison   Stim Site L Lat (ms) R Lat (ms) L-R Lat (ms) L Amp (mV) R Amp (mV) L-R Amp (%) Site1 Site2 L Vel (m/s) R Vel (m/s) L-R Vel (m/s)  Median Motor (Abd Poll Brev)  31.4C  Wrist  *5.8   6.1  Elbow Wrist  *46   Elbow  10.4   5.9        Ulnar Motor (Abd Dig Min)  31.7C  Wrist  3.4   9.2  B Elbow Wrist  53   B Elbow  6.6   7.9  A Elbow B Elbow  71   A Elbow  8.0   8.0           Waveforms:

## 2020-09-13 NOTE — Progress Notes (Signed)
[name] - 87 y.o. female MRN [id number]  Date of birth: 1932/10/09  Office Visit Note: Visit Date: 09/08/2020 PCP: Jarome Matin, MD Referred by: Jarome Matin, MD  Subjective: Chief Complaint  Patient presents with   Right Hand - Numbness, Pain    HPI:  [name] is a 87 y.o. female who comes in today at the request of Dr. Aldean Baker for electrodiagnostic study of the Right upper extremities.  Patient is Right hand dominant.  She reports chronic severe at times pain numbness and tingling and cramping of the right hand particularly at night but also during the day.  She denies any frank radicular symptoms.  She denies left-sided complaints.  She reports some improvement over the last few weeks.  She has had no prior electrodiagnostic studies.  She does inform me that no matter what we find she is not interested in any kind of surgery.   ROS Otherwise per HPI.  Assessment & Plan: Visit Diagnoses:    ICD-10-CM   1. Paresthesia of skin  R20.2 NCV with EMG (electromyography)      Plan: Impression: The above electrodiagnostic study is ABNORMAL and reveals evidence of a moderate right median nerve entrapment at the wrist (carpal tunnel syndrome) affecting sensory and motor components. There is no significant electrodiagnostic evidence of any other focal nerve entrapment, brachial plexopathy or cervical radiculopathy.   Recommendations: 1.  Follow-up with referring physician. 2.  Continue current management of symptoms. 3.  Continue use of resting splint at night-time and as needed during the day.  Consider diagnostic and therapeutic injection.  Meds & Orders: No orders of the defined types were placed in this encounter.   Orders Placed This Encounter  Procedures   NCV with EMG (electromyography)    Follow-up: Return for  Aldean Baker, MD.   Procedures: No procedures performed  EMG & NCV Findings: Evaluation of the right median motor nerve showed prolonged  distal onset latency (5.8 ms) and decreased conduction velocity (Elbow-Wrist, 46 m/s).  The right median (across palm) sensory nerve showed prolonged distal peak latency (Wrist, 5.5 ms) and prolonged distal peak latency (Palm, 11.3 ms).  The right ulnar sensory nerve showed prolonged distal peak latency (4.0 ms) and decreased conduction velocity (Wrist-5th Digit, 35 m/s).  All remaining nerves (as indicated in the following tables) were within normal limits.    All examined muscles (as indicated in the following table) showed no evidence of electrical instability.    Impression: The above electrodiagnostic study is ABNORMAL and reveals evidence of a moderate right median nerve entrapment at the wrist (carpal tunnel syndrome) affecting sensory and motor components. There is no significant electrodiagnostic evidence of any other focal nerve entrapment, brachial plexopathy or cervical radiculopathy.   Recommendations: 1.  Follow-up with referring physician. 2.  Continue current management of symptoms. 3.  Continue use of resting splint at night-time and as needed during the day.  Consider diagnostic and therapeutic injection.  ___________________________ Naaman Plummer FAAPMR Board Certified, American Board of Physical Medicine and Rehabilitation    Nerve Conduction Studies Anti Sensory Summary Table   Stim Site NR Peak (ms) Norm Peak (ms) P-T Amp (V) Norm P-T Amp Site1 Site2 Delta-P (ms) Dist (cm) Vel (m/s) Norm Vel (m/s)  Right Median Acr Palm Anti Sensory (2nd Digit)  30C  Wrist    *5.5 <3.6 11.4 >10 Wrist Palm 5.8 0.0    Palm    *11.3 <2.0 10.2  Right Radial Anti Sensory (Base 1st Digit)  31.1C  Wrist    2.5 <3.1 28.4  Wrist Base 1st Digit 2.5 0.0    Right Ulnar Anti Sensory (5th Digit)  31C  Wrist    *4.0 <3.7 20.7 >15.0 Wrist 5th Digit 4.0 14.0 *35 >38   Motor Summary Table   Stim Site NR Onset (ms) Norm Onset (ms) O-P Amp (mV) Norm O-P Amp Site1 Site2 Delta-0 (ms) Dist  (cm) Vel (m/s) Norm Vel (m/s)  Right Median Motor (Abd Poll Brev)  31.4C  Wrist    *5.8 <4.2 6.1 >5 Elbow Wrist 4.6 21.0 *46 >50  Elbow    10.4  5.9         Right Ulnar Motor (Abd Dig Min)  31.7C  Wrist    3.4 <4.2 9.2 >3 B Elbow Wrist 3.2 17.0 53 >53  B Elbow    6.6  7.9  A Elbow B Elbow 1.4 10.0 71 >53  A Elbow    8.0  8.0          EMG   Side Muscle Nerve Root Ins Act Fibs Psw Amp Dur Poly Recrt Int Dennie Bible Comment  Right Abd Poll Brev Median C8-T1 Nml Nml Nml Nml Nml 0 Nml Nml   Right 1stDorInt Ulnar C8-T1 Nml Nml Nml Nml Nml 0 Nml Nml   Right PronatorTeres Median C6-7 Nml Nml Nml Nml Nml 0 Nml Nml   Right Biceps Musculocut C5-6 Nml Nml Nml Nml Nml 0 Nml Nml   Right Deltoid Axillary C5-6 Nml Nml Nml Nml Nml 0 Nml Nml     Nerve Conduction Studies Anti Sensory Left/Right Comparison   Stim Site L Lat (ms) R Lat (ms) L-R Lat (ms) L Amp (V) R Amp (V) L-R Amp (%) Site1 Site2 L Vel (m/s) R Vel (m/s) L-R Vel (m/s)  Median Acr Palm Anti Sensory (2nd Digit)  30C  Wrist  *5.5   11.4  Wrist Palm     Palm  *11.3   10.2        Radial Anti Sensory (Base 1st Digit)  31.1C  Wrist  2.5   28.4  Wrist Base 1st Digit     Ulnar Anti Sensory (5th Digit)  31C  Wrist  *4.0   20.7  Wrist 5th Digit  *35    Motor Left/Right Comparison   Stim Site L Lat (ms) R Lat (ms) L-R Lat (ms) L Amp (mV) R Amp (mV) L-R Amp (%) Site1 Site2 L Vel (m/s) R Vel (m/s) L-R Vel (m/s)  Median Motor (Abd Poll Brev)  31.4C  Wrist  *5.8   6.1  Elbow Wrist  *46   Elbow  10.4   5.9        Ulnar Motor (Abd Dig Min)  31.7C  Wrist  3.4   9.2  B Elbow Wrist  53   B Elbow  6.6   7.9  A Elbow B Elbow  71   A Elbow  8.0   8.0           Waveforms:            Clinical History: MRI LUMBAR SPINE WITHOUT AND WITH CONTRAST   TECHNIQUE: Multiplanar and multiecho pulse sequences of the lumbar spine were obtained without and with intravenous contrast.   CONTRAST:  37mL GADAVIST GADOBUTROL 1 MMOL/ML IV SOLN   COMPARISON:   None.   FINDINGS: Significant motion artifact is present.   Segmentation:  Standard.   Alignment:  Grade 1 anterolisthesis at L4-L5 mild retrolisthesis at L1-L2 and L3-L4.   Vertebrae: Vertebral body heights are maintained apart from degenerative endplate irregularity, greatest at L3. There is minor degenerative endplate marrow edema, for example at L5-S1. No suspicious osseous lesion. No evidence of discitis or osteomyelitis.   Conus medullaris and cauda equina: Conus extends to the L1 level. Conus is unremarkable. There is possible reactive nerve root enhancement at the L4-L5 level.   Paraspinal and other soft tissues: Partially image distended bladder. Small calcified uterine fibroid. Otherwise unremarkable.   Disc levels:   L1-L2: Disc bulge and mild facet arthropathy. No significant canal or foraminal stenosis.   L2-L3: Disc bulge and mild facet arthropathy with ligamentum flavum infolding. No significant canal or foraminal stenosis.   L3-L4: Disc bulge with endplate osteophytic ridging and moderate facet arthropathy with ligamentum flavum infolding. Moderate canal stenosis with narrowing of the lateral recesses. Mild foraminal stenosis.   L4-L5: Anterolisthesis with uncovering of disc bulge. Moderate right and marked left facet arthropathy with ligamentum flavum infolding. Severe canal stenosis with effacement of the lateral recesses and crowding of the cauda equina. Mild to moderate right and moderate left foraminal stenosis.   L5-S1: Disc bulge with endplate osteophytic ridging eccentric to the left and moderate facet arthropathy with ligamentum flavum infolding. No significant canal or right foraminal stenosis. Mild left foraminal stenosis.   IMPRESSION: Motion degraded study.   No evidence of discitis/osteomyelitis.  No epidural collection   Multilevel degenerative changes as detailed above. Most notably, there is severe canal stenosis at L4-L5 with  crowding of the cauda equina.     Electronically Signed   By: Guadlupe Spanish M.D.   On: 04/30/2019 19:16     Objective:  VS:  HT:    WT:   BMI:     BP:   HR: bpm  TEMP: ( )  RESP:  Physical Exam Musculoskeletal:        General: No swelling, tenderness or deformity.     Comments: Inspection reveals osteoarthritic changes but no atrophy of the bilateral APB or FDI or hand intrinsics. There is no swelling, color changes, allodynia or dystrophic changes. There is 5 out of 5 strength in the bilateral wrist extension, finger abduction and long finger flexion. There is intact sensation to light touch in all dermatomal and peripheral nerve distributions.  There is a negative Phalen's test on the right. There is a negative Hoffmann's test bilaterally.  Skin:    General: Skin is warm and dry.     Findings: No erythema or rash.  Neurological:     General: No focal deficit present.     Mental Status: She is alert and oriented to person, place, and time.     Motor: No weakness or abnormal muscle tone.     Coordination: Coordination normal.  Psychiatric:        Mood and Affect: Mood normal.        Behavior: Behavior normal.     Imaging: No results found.

## 2020-09-23 ENCOUNTER — Ambulatory Visit: Payer: Medicare Other | Admitting: Orthopedic Surgery

## 2020-09-23 DIAGNOSIS — G5601 Carpal tunnel syndrome, right upper limb: Secondary | ICD-10-CM | POA: Diagnosis not present

## 2020-09-24 ENCOUNTER — Other Ambulatory Visit: Payer: Self-pay | Admitting: Physician Assistant

## 2020-09-24 ENCOUNTER — Other Ambulatory Visit: Payer: Self-pay | Admitting: Internal Medicine

## 2020-09-26 ENCOUNTER — Other Ambulatory Visit: Payer: Self-pay | Admitting: Physician Assistant

## 2020-09-30 ENCOUNTER — Encounter: Payer: Self-pay | Admitting: Orthopedic Surgery

## 2020-09-30 NOTE — Progress Notes (Signed)
Office Visit Note   Patient: [name]           Date of Birth: Jun 01, 1932           MRN: [id number] Visit Date: 09/23/2020              Requested by: Jarome Matin, MD [address] [place],  [place] [address] PCP: Jarome Matin, MD  Chief Complaint  Patient presents with   Right Hand - Follow-up      HPI: Patient is an 87 year old woman who presents complaining of right hand carpal tunnel symptoms.  She states she has had epidural steroid injections there is no pain in the hand today.  She states she has tingling into her fingers.  Assessment & Plan: Visit Diagnoses:  1. Carpal tunnel syndrome, right upper limb     Plan: Recommended Voltaren gel night bracing and follow-up if she wants to proceed with carpal tunnel release.  Follow-Up Instructions: Return if symptoms worsen or fail to improve.   Ortho Exam  Patient is alert, oriented, no adenopathy, well-dressed, normal affect, normal respiratory effort. Examination patient has thenar and hyperthenar eminence wasting.  The median radial and ulnar nerves are intact motor function.  Patient has tenderness to palpation over the carpal tunnel pain with flexion.  Imaging: No results found. No images are attached to the encounter.  Labs: Lab Results  Component Value Date   HGBA1C 5.6 12/07/2015   ESRSEDRATE 2 11/19/2008   REPTSTATUS 05/01/2019 FINAL 04/30/2019   CULT (A) 04/30/2019    <10,000 COLONIES/mL INSIGNIFICANT GROWTH Performed at [facility], [address], [place], [place] [address]      Lab Results  Component Value Date   ALBUMIN 4.4 06/10/2019   ALBUMIN 3.8 04/30/2019   ALBUMIN 3.9 11/28/2018    Lab Results  Component Value Date   MG 1.9 11/19/2008   MG 2.0 03/31/2007   Lab Results  Component Value Date   VD25OH 49 12/11/2008    No results found for: PREALBUMIN CBC EXTENDED Latest Ref Rng & Units 07/30/2020 07/15/2019 04/30/2019  WBC 3.4 - 10.8 x10E3/uL 9.9 6.6 18.5(H)   RBC 3.77 - 5.28 x10E6/uL 4.18 4.00 4.04  HGB 11.1 - 15.9 g/dL 86.5 78.4 69.6  HCT 29.5 - 46.6 % 38.0 37.2 36.5  PLT 150 - 450 x10E3/uL 208 189 185  NEUTROABS 1.7 - 7.7 K/uL - - 15.1(H)  LYMPHSABS 0.7 - 4.0 K/uL - - 1.0     There is no height or weight on file to calculate BMI.  Orders:  No orders of the defined types were placed in this encounter.  No orders of the defined types were placed in this encounter.    Procedures: No procedures performed  Clinical Data: No additional findings.  ROS:  All other systems negative, except as noted in the HPI. Review of Systems  Objective: Vital Signs: There were no vitals taken for this visit.  Specialty Comments:  No specialty comments available.  PMFS History: Patient Active Problem List   Diagnosis Date Noted   Chest pain 11/28/2018   Chest pain on breathing 12/06/2015   Exertional chest pain    COUGH 06/24/2009   HYPOPOTASSEMIA 12/31/2008   Pure hypercholesterolemia 12/16/2008   Hyperlipidemia, mixed 08/28/2008   HYPERTENSION, BENIGN 08/28/2008   S/P CABG x 3 08/28/2008   GERD 10/16/2007   Past Medical History:  Diagnosis Date   Anemia    Coronary artery disease    Diabetes mellitus  DJD (degenerative joint disease)    Dysrhythmia    Edema    GERD (gastroesophageal reflux disease)    Hiatal hernia    History of blood transfusion    Hyperlipidemia    Hypertension    Hypothyroidism    IBS (irritable bowel syndrome)    SVT (supraventricular tachycardia) (HCC)    with ablation    Family History  Problem Relation Age of Onset   Heart attack Mother 7       died   Heart attack Father 66       died   Breast cancer Sister    Diabetes Sister        and brother   Heart disease Other        sister, father and brother   Coronary artery disease Brother        PCI    Past Surgical History:  Procedure Laterality Date   APPENDECTOMY     BREAST SURGERY     biopsy per left breast    CARDIAC  CATHETERIZATION     CHOLECYSTECTOMY  2005   CORONARY ARTERY BYPASS GRAFT  2000   EYE SURGERY     cataract surgery bilat    REPLACEMENT TOTAL KNEE  2009   left   Social History   Occupational History   Occupation: retired    Associate Professor: RETIRED  Tobacco Use   Smoking status: Never   Smokeless tobacco: Never  Substance and Sexual Activity   Alcohol use: No   Drug use: No   Sexual activity: Not on file

## 2021-01-02 ENCOUNTER — Other Ambulatory Visit: Payer: Self-pay | Admitting: Internal Medicine

## 2021-01-16 ENCOUNTER — Other Ambulatory Visit: Payer: Self-pay | Admitting: Internal Medicine

## 2021-04-05 ENCOUNTER — Other Ambulatory Visit: Payer: Self-pay | Admitting: Internal Medicine

## 2021-04-19 ENCOUNTER — Other Ambulatory Visit: Payer: Self-pay | Admitting: Internal Medicine

## 2021-07-12 ENCOUNTER — Ambulatory Visit: Payer: Medicare Other | Admitting: Orthopedic Surgery

## 2021-07-12 ENCOUNTER — Ambulatory Visit (INDEPENDENT_AMBULATORY_CARE_PROVIDER_SITE_OTHER): Payer: Medicare Other

## 2021-07-12 ENCOUNTER — Encounter: Payer: Self-pay | Admitting: Orthopedic Surgery

## 2021-07-12 DIAGNOSIS — G8929 Other chronic pain: Secondary | ICD-10-CM

## 2021-07-12 DIAGNOSIS — M5442 Lumbago with sciatica, left side: Secondary | ICD-10-CM | POA: Diagnosis not present

## 2021-07-12 DIAGNOSIS — M5441 Lumbago with sciatica, right side: Secondary | ICD-10-CM | POA: Diagnosis not present

## 2021-07-12 MED ORDER — PREDNISONE 10 MG PO TABS: 10.0000 mg | ORAL_TABLET | Freq: Every day | ORAL | 0 refills | Status: DC

## 2021-07-12 NOTE — Progress Notes (Signed)
? ?Office Visit Note ?  ?Patient: [name]           ?Date of Birth: June 12, 1932           ?MRN: [id number] ?Visit Date: 07/12/2021 ?             ?Requested by: Garlan Fillers, MD ?[address] ?[place],  [place] [address] ?PCP: Garlan Fillers, MD ? ?Chief Complaint  ?Patient presents with  ? Lower Back - Pain  ? ? ? ? ?HPI: ?Patient is a 88 year old woman who presents with recurrent lower back pain radiating down both legs right worse than left patient states it is just painful.  She currently ambulates with a cane she has previously used prednisone and has undergone epidural steroid injections which she states has helped with the radicular symptoms. ? ?Assessment & Plan: ?Visit Diagnoses:  ?1. Chronic bilateral low back pain with bilateral sciatica   ? ? ?Plan: We will set up an appointment with Dr. Alvester Morin to evaluate for repeat epidural steroid injections she is given a prescription for prednisone to take 10 mg with breakfast. ? ?Follow-Up Instructions: Return if symptoms worsen or fail to improve.  ? ?Ortho Exam ? ?Patient is alert, oriented, no adenopathy, well-dressed, normal affect, normal respiratory effort. ?Examination patient has no pain with range of motion of the hip knee or ankle she has a negative straight leg raise and no focal motor weakness. ? ?Imaging: ?XR Lumbar Spine 2-3 Views ? ?Result Date: 07/12/2021 ?2 view radiographs of the lumbar spine shows significant degenerative disc disease with healed old compression fractures.  ?No images are attached to the encounter. ? ?Labs: ?Lab Results  ?Component Value Date  ? HGBA1C 5.6 12/07/2015  ? ESRSEDRATE 2 11/19/2008  ? REPTSTATUS 05/01/2019 FINAL 04/30/2019  ? CULT (A) 04/30/2019  ?  <10,000 COLONIES/mL INSIGNIFICANT GROWTH ?Performed at [facility], [address] [place], [place] [address] ?  ? ? ? ?Lab Results  ?Component Value Date  ? ALBUMIN 4.4 06/10/2019  ? ALBUMIN 3.8 04/30/2019  ? ALBUMIN 3.9 11/28/2018  ? ? ?Lab Results   ?Component Value Date  ? MG 1.9 11/19/2008  ? MG 2.0 03/31/2007  ? ?Lab Results  ?Component Value Date  ? VD25OH 49 12/11/2008  ? ? ?No results found for: PREALBUMIN ? ?  Latest Ref Rng & Units 07/30/2020  ?  1:26 PM 07/15/2019  ?  8:33 PM 04/30/2019  ? 11:03 AM  ?CBC EXTENDED  ?WBC 3.4 - 10.8 x10E3/uL 9.9   6.6   18.5    ?RBC 3.77 - 5.28 x10E6/uL 4.18   4.00   4.04    ?Hemoglobin 11.1 - 15.9 g/dL 97.9   89.2   11.9    ?HCT 34.0 - 46.6 % 38.0   37.2   36.5    ?Platelets 150 - 450 x10E3/uL 208   189   185    ?NEUT# 1.7 - 7.7 K/uL   15.1    ?Lymph# 0.7 - 4.0 K/uL   1.0    ? ? ? ?There is no height or weight on file to calculate BMI. ? ?Orders:  ?Orders Placed This Encounter  ?Procedures  ? XR Lumbar Spine 2-3 Views  ? ?Meds ordered this encounter  ?Medications  ? predniSONE (DELTASONE) 10 MG tablet  ?  Sig: Take 1 tablet (10 mg total) by mouth daily with breakfast.  ?  Dispense:  30 tablet  ?  Refill:  0  ? ? ?  Procedures: ?No procedures performed ? ?Clinical Data: ?No additional findings. ? ?ROS: ? ?All other systems negative, except as noted in the HPI. ?Review of Systems ? ?Objective: ?Vital Signs: There were no vitals taken for this visit. ? ?Specialty Comments:  ?No specialty comments available. ? ?PMFS History: ?Patient Active Problem List  ? Diagnosis Date Noted  ? Chest pain 11/28/2018  ? Chest pain on breathing 12/06/2015  ? Exertional chest pain   ? COUGH 06/24/2009  ? HYPOPOTASSEMIA 12/31/2008  ? Pure hypercholesterolemia 12/16/2008  ? Hyperlipidemia, mixed 08/28/2008  ? HYPERTENSION, BENIGN 08/28/2008  ? S/P CABG x 3 08/28/2008  ? GERD 10/16/2007  ? ?Past Medical History:  ?Diagnosis Date  ? Anemia   ? Coronary artery disease   ? Diabetes mellitus   ? DJD (degenerative joint disease)   ? Dysrhythmia   ? Edema   ? GERD (gastroesophageal reflux disease)   ? Hiatal hernia   ? History of blood transfusion   ? Hyperlipidemia   ? Hypertension   ? Hypothyroidism   ? IBS (irritable bowel syndrome)   ? SVT  (supraventricular tachycardia) (HCC)   ? with ablation  ?  ?Family History  ?Problem Relation Age of Onset  ? Heart attack Mother 63  ?     died  ? Heart attack Father 41  ?     died  ? Breast cancer Sister   ? Diabetes Sister   ?     and brother  ? Heart disease Other   ?     sister, father and brother  ? Coronary artery disease Brother   ?     PCI  ?  ?Past Surgical History:  ?Procedure Laterality Date  ? APPENDECTOMY    ? BREAST SURGERY    ? biopsy per left breast   ? CARDIAC CATHETERIZATION    ? CHOLECYSTECTOMY  2005  ? CORONARY ARTERY BYPASS GRAFT  2000  ? EYE SURGERY    ? cataract surgery bilat   ? REPLACEMENT TOTAL KNEE  2009  ? left  ? ?Social History  ? ?Occupational History  ? Occupation: retired  ?  Employer: RETIRED  ?Tobacco Use  ? Smoking status: Never  ? Smokeless tobacco: Never  ?Substance and Sexual Activity  ? Alcohol use: No  ? Drug use: No  ? Sexual activity: Not on file  ? ? ? ? ? ?

## 2021-07-26 NOTE — Progress Notes (Unsigned)
.    Cardiology Office Note   Date:  07/26/2021   ID:  [name], DOB 1932-05-29, MRN [id number]  PCP:  Garlan Fillers, MD  Cardiologist:   Dietrich Pates, MD    F/U of CAD     History of Present Illness: [name] is a 88 y.o. female with a history of CAD (s/p CABG in 2000.  Myoview in 2013 was normal.  In 2016 admitted with CP   Myovue again done,  Normal Echo in 2020:   Normal LV function  Oct 2020   Admitted again with chest tightness  Myovue showed no ischemia      I saw the pt in Dec 2020   She was seen by Wende Mott in March 2021 Since seen the pt denies CP   Breathing is OK   Does note occasional gas.    Pt says she is  having signif problems with back   Seen by F. Odette Fraction     I saw the pt in clnic in June 2022   Outpatient Medications Prior to Visit  Medication Sig Dispense Refill   acetaminophen (TYLENOL) 325 MG tablet Take 2 tablets (650 mg total) by mouth every 6 (six) hours as needed for up to 30 doses for mild pain or moderate pain. 30 tablet 0   amLODipine (NORVASC) 10 MG tablet TAKE 1 TABLET BY MOUTH EVERY DAY 90 tablet 2   aspirin 81 MG tablet Take 81 mg by mouth daily.     calcium carbonate (TUMS - DOSED IN MG ELEMENTAL CALCIUM) 500 MG chewable tablet Chew 1 tablet by mouth as needed for indigestion or heartburn.     ergocalciferol (VITAMIN D2) 50000 UNITS capsule Take 50,000 Units by mouth once a week.     esomeprazole (NEXIUM) 20 MG capsule Take 20 mg by mouth daily before breakfast.     ezetimibe (ZETIA) 10 MG tablet TAKE 1/2 TABLET BY MOUTH EVERY DAY 45 tablet 1   furosemide (LASIX) 20 MG tablet TAKE 1 TABLET BY MOUTH EVERY DAY 90 tablet 3   KLOR-CON M20 20 MEQ tablet TAKE 1 TABLET BY MOUTH EVERY DAY 90 tablet 3   levothyroxine (SYNTHROID, LEVOTHROID) 75 MCG tablet Take 75 mcg by mouth daily.     lidocaine (LIDODERM) 5 % PLACE 1 PATCH ONTO THE SKIN DAILY. REMOVE & DISCARD PATCH WITHIN 12 HOURS OR AS DIRECTED BY MD 30 patch 2   lisinopril  (ZESTRIL) 10 MG tablet TAKE 1 TABLET BY MOUTH EVERY DAY 90 tablet 1   methocarbamol (ROBAXIN) 500 MG tablet Take 1 tablet (500 mg total) by mouth 3 (three) times daily. 30 tablet 0   metoprolol succinate (TOPROL-XL) 25 MG 24 hr tablet TAKE 1 TABLET BY MOUTH EVERY DAY 90 tablet 3   nitroGLYCERIN (NITROSTAT) 0.4 MG SL tablet Place 1 tablet (0.4 mg total) under the tongue every 5 (five) minutes x 3 doses as needed for chest pain. 25 tablet 2   ONETOUCH VERIO test strip CHECK BLOOD SUGARS ONCE DAILY     predniSONE (DELTASONE) 10 MG tablet Take 1 tablet (10 mg total) by mouth daily with breakfast. 30 tablet 0   rosuvastatin (CRESTOR) 10 MG tablet TAKE 1 TABLET BY MOUTH EVERY DAY 90 tablet 3   traMADol (ULTRAM) 50 MG tablet Take 1 tablet (50 mg total) by mouth every 6 (six) hours as needed. 30 tablet 0   No facility-administered medications prior to visit.  Allergies:   Actos [pioglitazone hydrochloride], Codeine, and Lipitor [atorvastatin]   Past Medical History:  Diagnosis Date   Anemia    Coronary artery disease    Diabetes mellitus    DJD (degenerative joint disease)    Dysrhythmia    Edema    GERD (gastroesophageal reflux disease)    Hiatal hernia    History of blood transfusion    Hyperlipidemia    Hypertension    Hypothyroidism    IBS (irritable bowel syndrome)    SVT (supraventricular tachycardia) (HCC)    with ablation    Past Surgical History:  Procedure Laterality Date   APPENDECTOMY     BREAST SURGERY     biopsy per left breast    CARDIAC CATHETERIZATION     CHOLECYSTECTOMY  2005   CORONARY ARTERY BYPASS GRAFT  2000   EYE SURGERY     cataract surgery bilat    REPLACEMENT TOTAL KNEE  2009   left     Social History:  The patient  reports that she has never smoked. She has never used smokeless tobacco. She reports that she does not drink alcohol and does not use drugs.   Family History:  The patient's family history includes Breast cancer in her sister;  Coronary artery disease in her brother; Diabetes in her sister; Heart attack (age of onset: 56) in her father; Heart attack (age of onset: 26) in her mother; Heart disease in an other family member.    ROS:  Please see the history of present illness. All other systems are reviewed and  Negative to the above problem except as noted.    PHYSICAL EXAM: VS:  There were no vitals taken for this visit.  GEN: Well nourished, well developed, in no acute distress  HEENT: normal  Neck: JVP is normal  Cardiac: RRR;  S1, S2  No S3  No murmurs  Tr LE   edema  Respiratory:  clear to auscultation bilaterally, normal work of breathing GI: soft, nontender, nondistended, + BS  No hepatomegaly  MS: no deformity Moving all extremities   Skin: warm and dry, no rash Neuro:  Strength and sensation are intact Psych: euthymic mood, full affect   EKG:  EKG is  ordered today    Not done    Lipid Panel    Component Value Date/Time   CHOL 151 07/30/2020 1326   TRIG 134 07/30/2020 1326   HDL 82 07/30/2020 1326   CHOLHDL 1.8 07/30/2020 1326   CHOLHDL 2.6 12/07/2015 0440   VLDL 24 12/07/2015 0440   LDLCALC 47 07/30/2020 1326      Wt Readings from Last 3 Encounters:  07/30/20 132 lb (59.9 kg)  12/22/19 135 lb (61.2 kg)  11/24/19 135 lb (61.2 kg)      ASSESSMENT AND PLAN:  1  CAD   Remote CABG  Myoview in 2020 without ischemia  Pt currently denies symptoms     2  HL Pt now on Crestor   LDL in Aug 2021 was 58  Keep on same meds .  Check today    4  HTN  BP is controlled for age     Keep on same meds .  Check:  CBC, BMET, Lipids and TSH   Current medicines are reviewed at length with the patient today.  The patient does not have concerns regarding medicines.  Signed, Dietrich Pates, MD  07/26/2021 5:09 PM    [facility] [address] [place], [place],  [place]  [address] Phone: [phone]; Fax: [phone]

## 2021-07-27 ENCOUNTER — Ambulatory Visit: Payer: Medicare Other | Admitting: Internal Medicine

## 2021-07-27 ENCOUNTER — Ambulatory Visit: Payer: Medicare Other | Admitting: Physical Medicine and Rehabilitation

## 2021-07-27 ENCOUNTER — Encounter: Payer: Self-pay | Admitting: Internal Medicine

## 2021-07-27 ENCOUNTER — Encounter: Payer: Self-pay | Admitting: Physical Medicine and Rehabilitation

## 2021-07-27 VITALS — BP 146/88 | HR 74 | Ht 62.0 in | Wt 133.2 lb

## 2021-07-27 DIAGNOSIS — M5416 Radiculopathy, lumbar region: Secondary | ICD-10-CM

## 2021-07-27 DIAGNOSIS — M5442 Lumbago with sciatica, left side: Secondary | ICD-10-CM | POA: Diagnosis not present

## 2021-07-27 DIAGNOSIS — E782 Mixed hyperlipidemia: Secondary | ICD-10-CM | POA: Diagnosis not present

## 2021-07-27 DIAGNOSIS — G8929 Other chronic pain: Secondary | ICD-10-CM

## 2021-07-27 DIAGNOSIS — M5441 Lumbago with sciatica, right side: Secondary | ICD-10-CM

## 2021-07-27 DIAGNOSIS — M48062 Spinal stenosis, lumbar region with neurogenic claudication: Secondary | ICD-10-CM

## 2021-07-27 DIAGNOSIS — Z79899 Other long term (current) drug therapy: Secondary | ICD-10-CM | POA: Diagnosis not present

## 2021-07-27 DIAGNOSIS — M4726 Other spondylosis with radiculopathy, lumbar region: Secondary | ICD-10-CM

## 2021-07-27 DIAGNOSIS — I2510 Atherosclerotic heart disease of native coronary artery without angina pectoris: Secondary | ICD-10-CM | POA: Diagnosis not present

## 2021-07-27 LAB — CBC
Hematocrit: 39.7 % (ref 34.0–46.6)
Hemoglobin: 14.1 g/dL (ref 11.1–15.9)
MCH: 32 pg (ref 26.6–33.0)
MCHC: 35.5 g/dL (ref 31.5–35.7)
MCV: 90 fL (ref 79–97)
Platelets: 248 10*3/uL (ref 150–450)
RBC: 4.4 x10E6/uL (ref 3.77–5.28)
RDW: 13.3 % (ref 11.7–15.4)
WBC: 15.7 10*3/uL — ABNORMAL HIGH (ref 3.4–10.8)

## 2021-07-27 LAB — BASIC METABOLIC PANEL
BUN/Creatinine Ratio: 11 — ABNORMAL LOW (ref 12–28)
BUN: 10 mg/dL (ref 8–27)
CO2: 25 mmol/L (ref 20–29)
Calcium: 9.7 mg/dL (ref 8.7–10.3)
Chloride: 93 mmol/L — ABNORMAL LOW (ref 96–106)
Creatinine, Ser: 0.89 mg/dL (ref 0.57–1.00)
Glucose: 113 mg/dL — ABNORMAL HIGH (ref 70–99)
Potassium: 4.2 mmol/L (ref 3.5–5.2)
Sodium: 132 mmol/L — ABNORMAL LOW (ref 134–144)
eGFR: 63 mL/min/{1.73_m2} (ref >59)

## 2021-07-27 NOTE — Progress Notes (Signed)
Numeric Pain Rating Scale and Functional Assessment Average Pain 8   In the last MONTH (on 0-10 scale) has pain interfered with the following?  1. General activity like being  able to carry out your everyday physical activities such as walking, climbing stairs, carrying groceries, or moving a chair?  Rating(9)   +Driver, -BT, -Dye Allergies.  After a busy day she has a hard time to get up and go to the bathroom, states that she has pain in the lower back and down the legs to the feet, states that she hurts constantly, she on prednisone and it is helping some she has been on it since 07/13/21

## 2021-07-27 NOTE — Progress Notes (Signed)
[name] - 88 y.o. female MRN [id number]  Date of birth: 1932/10/13  Office Visit Note: Visit Date: 07/27/2021 PCP: Donnajean Lopes, MD Referred by: Donnajean Lopes, MD  Subjective: Chief Complaint  Patient presents with   Lower Back - Pain   HPI: [name] is a 88 y.o. female who comes in today for evaluation of chronic, worsening and severe bilateral lower back pain radiating to bilateral legs. Patient was last seen in our office in July of 2022. Patient reports pain has been ongoing for several years and is exacerbated by standing and walking. Patient states her pain most severe in the mornings after waking up. She describes her pain as sore, aching and tingling, currently rates pain as 7 out of 10. Patient states some relief of pain with home exercise regimen, rest and use of Tylenol. Patients lumbar MRI from 2021 exhibits multi-level facet hypertrophy and severe spinal canal stenosis at L4-L5. Patient had done well with intermittent bilateral L4 transforaminal epidural steroid injections over the years, she reports significant relief of pain with this procedure. Patient states she would like to repeat injection if possible. Patient is currently using cane to assist with ambulation and prevent falls. Patient denies focal weakness. Patient denies recent trauma or falls.   Review of Systems  Musculoskeletal:  Positive for back pain.  Neurological:  Positive for tingling. Negative for focal weakness and weakness.  All other systems reviewed and are negative. Otherwise per HPI.  Assessment & Plan: Visit Diagnoses:    ICD-10-CM   1. Lumbar radiculopathy  M54.16 Ambulatory referral to Physical Medicine Rehab    2. Other spondylosis with radiculopathy, lumbar region  M47.26     3. Chronic bilateral low back pain with bilateral sciatica  M54.42    M54.41    G89.29     4. Spinal stenosis of lumbar region with neurogenic claudication  M48.062        Plan: Findings:   Chronic, worsening and severe bilateral lower back pain radiating down both legs. Patient continues to have severe pain despite good conservative therapies such as home exercise regimen, rest and use of Tylenol. Patients clinical presentation and exam are consistent with neurogenic claudication as a result of spinal canal stenosis. We believe the next step is to repeat bilateral L4 transforaminal epidural steroid injection under fluoroscopic guidance. Patient encouraged to remain active and to continue with home exercise regimen. Patient encouraged to use cane to assist with ambulation and prevent falls. No red flag symptoms noted upon exam today.    Meds & Orders: No orders of the defined types were placed in this encounter.   Orders Placed This Encounter  Procedures   Ambulatory referral to Physical Medicine Rehab    Follow-up: Return for Bilateral L4 transforaminal epidural steroid injection.   Procedures: No procedures performed      Clinical History: EXAM: MRI LUMBAR SPINE WITHOUT AND WITH CONTRAST   TECHNIQUE: Multiplanar and multiecho pulse sequences of the lumbar spine were obtained without and with intravenous contrast.   CONTRAST:  72mL GADAVIST GADOBUTROL 1 MMOL/ML IV SOLN   COMPARISON:  None.   FINDINGS: Significant motion artifact is present.   Segmentation:  Standard.   Alignment: Grade 1 anterolisthesis at L4-L5 mild retrolisthesis at L1-L2 and L3-L4.   Vertebrae: Vertebral body heights are maintained apart from degenerative endplate irregularity, greatest at L3. There is minor degenerative endplate marrow edema, for example at L5-S1. No suspicious osseous lesion. No evidence of discitis  or osteomyelitis.   Conus medullaris and cauda equina: Conus extends to the L1 level. Conus is unremarkable. There is possible reactive nerve root enhancement at the L4-L5 level.   Paraspinal and other soft tissues: Partially image distended bladder. Small calcified uterine  fibroid. Otherwise unremarkable.   Disc levels:   L1-L2: Disc bulge and mild facet arthropathy. No significant canal or foraminal stenosis.   L2-L3: Disc bulge and mild facet arthropathy with ligamentum flavum infolding. No significant canal or foraminal stenosis.   L3-L4: Disc bulge with endplate osteophytic ridging and moderate facet arthropathy with ligamentum flavum infolding. Moderate canal stenosis with narrowing of the lateral recesses. Mild foraminal stenosis.   L4-L5: Anterolisthesis with uncovering of disc bulge. Moderate right and marked left facet arthropathy with ligamentum flavum infolding. Severe canal stenosis with effacement of the lateral recesses and crowding of the cauda equina. Mild to moderate right and moderate left foraminal stenosis.   L5-S1: Disc bulge with endplate osteophytic ridging eccentric to the left and moderate facet arthropathy with ligamentum flavum infolding. No significant canal or right foraminal stenosis. Mild left foraminal stenosis.   IMPRESSION: Motion degraded study.   No evidence of discitis/osteomyelitis.  No epidural collection   Multilevel degenerative changes as detailed above. Most notably, there is severe canal stenosis at L4-L5 with crowding of the cauda equina.     Electronically Signed   By: Macy Mis M.D.   On: 04/30/2019 19:16   She reports that she has never smoked. She has never used smokeless tobacco. No results for input(s): HGBA1C, LABURIC in the last 8760 hours.  Objective:  VS:  HT:    WT:   BMI:     BP:   HR: bpm  TEMP: ( )  RESP:  Physical Exam Vitals and nursing note reviewed.  HENT:     Head: Normocephalic and atraumatic.     Right Ear: External ear normal.     Left Ear: External ear normal.     Nose: Nose normal.     Mouth/Throat:     Mouth: Mucous membranes are moist.  Eyes:     Extraocular Movements: Extraocular movements intact.  Cardiovascular:     Rate and Rhythm: Normal rate.      Pulses: Normal pulses.  Pulmonary:     Effort: Pulmonary effort is normal.  Abdominal:     General: Abdomen is flat. There is no distension.  Musculoskeletal:        General: Tenderness present.     Cervical back: Normal range of motion.     Comments: Pt is slow to rise from seated position to standing. Good lumbar range of motion. Strong distal strength without clonus, no pain upon palpation of greater trochanters. Sensation intact bilaterally. Ambulates with cane, gait slow and unsteady.   Skin:    General: Skin is warm and dry.     Capillary Refill: Capillary refill takes less than 2 seconds.  Neurological:     Mental Status: She is alert and oriented to person, place, and time.     Gait: Gait abnormal.  Psychiatric:        Mood and Affect: Mood normal.        Behavior: Behavior normal.    Ortho Exam  Imaging: No results found.  Past Medical/Family/Surgical/Social History: Medications & Allergies reviewed per EMR, new medications updated. Patient Active Problem List   Diagnosis Date Noted   Chest pain 11/28/2018   Chest pain on breathing 12/06/2015   Exertional chest pain  COUGH 06/24/2009   HYPOPOTASSEMIA 12/31/2008   Pure hypercholesterolemia 12/16/2008   Hyperlipidemia, mixed 08/28/2008   HYPERTENSION, BENIGN 08/28/2008   S/P CABG x 3 08/28/2008   GERD 10/16/2007   Past Medical History:  Diagnosis Date   Anemia    Coronary artery disease    Diabetes mellitus    DJD (degenerative joint disease)    Dysrhythmia    Edema    GERD (gastroesophageal reflux disease)    Hiatal hernia    History of blood transfusion    Hyperlipidemia    Hypertension    Hypothyroidism    IBS (irritable bowel syndrome)    SVT (supraventricular tachycardia) (HCC)    with ablation   Family History  Problem Relation Age of Onset   Heart attack Mother 59       died   Heart attack Father 67       died   Breast cancer Sister    Diabetes Sister        and brother   Heart  disease Other        sister, father and brother   Coronary artery disease Brother        PCI   Past Surgical History:  Procedure Laterality Date   APPENDECTOMY     BREAST SURGERY     biopsy per left breast    CARDIAC CATHETERIZATION     CHOLECYSTECTOMY  2005   CORONARY ARTERY BYPASS GRAFT  2000   EYE SURGERY     cataract surgery bilat    REPLACEMENT TOTAL KNEE  2009   left   Social History   Occupational History   Occupation: retired    Fish farm manager: RETIRED  Tobacco Use   Smoking status: Never   Smokeless tobacco: Never  Substance and Sexual Activity   Alcohol use: No   Drug use: No   Sexual activity: Not on file

## 2021-07-27 NOTE — Patient Instructions (Signed)
Medication Instructions:   *If you need a refill on your cardiac medications before your next appointment, please call your pharmacy*   Lab Work: BMET AND CBC   If you have labs (blood work) drawn today and your tests are completely normal, you will receive your results only by: MyChart Message (if you have MyChart) OR A paper copy in the mail If you have any lab test that is abnormal or we need to change your treatment, we will call you to review the results.   Testing/Procedures:    Follow-Up: At [facility], you and your health needs are our priority.  As part of our continuing mission to provide you with exceptional heart care, we have created designated Provider Care Teams.  These Care Teams include your primary Cardiologist (physician) and Advanced Practice Providers (APPs -  Physician Assistants and Nurse Practitioners) who all work together to provide you with the care you need, when you need it.  We recommend signing up for the patient portal called "MyChart".  Sign up information is provided on this After Visit Summary.  MyChart is used to connect with patients for Virtual Visits (Telemedicine).  Patients are able to view lab/test results, encounter notes, upcoming appointments, etc.  Non-urgent messages can be sent to your provider as well.   To learn more about what you can do with MyChart, go to [email or web address].    Your next appointment:   8 month(s)  The format for your next appointment:   In Person  Provider:   Paula Ross, MD     Other Instructions   Important Information About Sugar       

## 2021-08-01 ENCOUNTER — Other Ambulatory Visit: Payer: Self-pay | Admitting: *Deleted

## 2021-08-01 DIAGNOSIS — D72829 Elevated white blood cell count, unspecified: Secondary | ICD-10-CM

## 2021-08-01 DIAGNOSIS — I10 Essential (primary) hypertension: Secondary | ICD-10-CM

## 2021-08-01 DIAGNOSIS — E876 Hypokalemia: Secondary | ICD-10-CM

## 2021-08-08 ENCOUNTER — Other Ambulatory Visit: Payer: Self-pay | Admitting: *Deleted

## 2021-08-08 ENCOUNTER — Other Ambulatory Visit: Payer: Medicare Other

## 2021-08-08 DIAGNOSIS — E876 Hypokalemia: Secondary | ICD-10-CM

## 2021-08-08 DIAGNOSIS — D72829 Elevated white blood cell count, unspecified: Secondary | ICD-10-CM

## 2021-08-08 DIAGNOSIS — I10 Essential (primary) hypertension: Secondary | ICD-10-CM

## 2021-08-09 LAB — COMPREHENSIVE METABOLIC PANEL
ALT: 12 IU/L (ref 0–32)
AST: 14 IU/L (ref 0–40)
Albumin/Globulin Ratio: 2 (ref 1.2–2.2)
Albumin: 4.3 g/dL (ref 3.6–4.6)
Alkaline Phosphatase: 87 IU/L (ref 44–121)
BUN/Creatinine Ratio: 19 (ref 12–28)
BUN: 15 mg/dL (ref 8–27)
Bilirubin Total: 0.5 mg/dL (ref 0.0–1.2)
CO2: 22 mmol/L (ref 20–29)
Calcium: 9.1 mg/dL (ref 8.7–10.3)
Chloride: 90 mmol/L — ABNORMAL LOW (ref 96–106)
Creatinine, Ser: 0.8 mg/dL (ref 0.57–1.00)
Globulin, Total: 2.1 g/dL (ref 1.5–4.5)
Glucose: 195 mg/dL — ABNORMAL HIGH (ref 70–99)
Potassium: 4.9 mmol/L (ref 3.5–5.2)
Sodium: 127 mmol/L — ABNORMAL LOW (ref 134–144)
Total Protein: 6.4 g/dL (ref 6.0–8.5)
eGFR: 72 mL/min/{1.73_m2} (ref >59)

## 2021-08-09 LAB — URINALYSIS
Bilirubin, UA: NEGATIVE
Glucose, UA: NEGATIVE
Ketones, UA: NEGATIVE
Leukocytes,UA: NEGATIVE
Nitrite, UA: NEGATIVE
Protein,UA: NEGATIVE
RBC, UA: NEGATIVE
Specific Gravity, UA: 1.007 (ref 1.005–1.030)
Urobilinogen, Ur: 0.2 mg/dL (ref 0.2–1.0)
pH, UA: 6.5 (ref 5.0–7.5)

## 2021-08-09 LAB — CBC WITH DIFFERENTIAL/PLATELET
Basophils Absolute: 0.1 10*3/uL (ref 0.0–0.2)
Basos: 1 %
EOS (ABSOLUTE): 0.1 10*3/uL (ref 0.0–0.4)
Eos: 0 %
Hematocrit: 36.5 % (ref 34.0–46.6)
Hemoglobin: 13 g/dL (ref 11.1–15.9)
Immature Grans (Abs): 0.4 10*3/uL — ABNORMAL HIGH (ref 0.0–0.1)
Immature Granulocytes: 3 %
Lymphocytes Absolute: 0.9 10*3/uL (ref 0.7–3.1)
Lymphs: 7 %
MCH: 32.3 pg (ref 26.6–33.0)
MCHC: 35.6 g/dL (ref 31.5–35.7)
MCV: 91 fL (ref 79–97)
Monocytes Absolute: 0.6 10*3/uL (ref 0.1–0.9)
Monocytes: 5 %
Neutrophils Absolute: 10.5 10*3/uL — ABNORMAL HIGH (ref 1.4–7.0)
Neutrophils: 84 %
Platelets: 216 10*3/uL (ref 150–450)
RBC: 4.03 x10E6/uL (ref 3.77–5.28)
RDW: 13 % (ref 11.7–15.4)
WBC: 12.5 10*3/uL — ABNORMAL HIGH (ref 3.4–10.8)

## 2021-08-09 LAB — TSH: TSH: 0.507 u[IU]/mL (ref 0.450–4.500)

## 2021-08-11 ENCOUNTER — Telehealth: Payer: Self-pay | Admitting: Internal Medicine

## 2021-08-11 DIAGNOSIS — Z79899 Other long term (current) drug therapy: Secondary | ICD-10-CM

## 2021-08-11 DIAGNOSIS — D72829 Elevated white blood cell count, unspecified: Secondary | ICD-10-CM

## 2021-08-11 DIAGNOSIS — E871 Hypo-osmolality and hyponatremia: Secondary | ICD-10-CM

## 2021-08-11 NOTE — Telephone Encounter (Signed)
Patient is calling to talk with Dr. Tenny Craw or nurse about results

## 2021-08-11 NOTE — Telephone Encounter (Signed)
Tried to call patient, phone rang busy

## 2021-08-12 NOTE — Telephone Encounter (Signed)
Pt is aware of results and instructions to d/c Furosemide 20 mg daily and potassium chloride.  She will come in for f/u blood work on Friday June 23.

## 2021-08-19 ENCOUNTER — Other Ambulatory Visit: Payer: Medicare Other

## 2021-08-19 DIAGNOSIS — E871 Hypo-osmolality and hyponatremia: Secondary | ICD-10-CM

## 2021-08-19 DIAGNOSIS — Z79899 Other long term (current) drug therapy: Secondary | ICD-10-CM

## 2021-08-19 DIAGNOSIS — D72829 Elevated white blood cell count, unspecified: Secondary | ICD-10-CM

## 2021-08-20 LAB — CBC
Hematocrit: 33.4 % — ABNORMAL LOW (ref 34.0–46.6)
Hemoglobin: 12.3 g/dL (ref 11.1–15.9)
MCH: 33.3 pg — ABNORMAL HIGH (ref 26.6–33.0)
MCHC: 36.8 g/dL — ABNORMAL HIGH (ref 31.5–35.7)
MCV: 91 fL (ref 79–97)
Platelets: 182 10*3/uL (ref 150–450)
RBC: 3.69 x10E6/uL — ABNORMAL LOW (ref 3.77–5.28)
RDW: 13.5 % (ref 11.7–15.4)
WBC: 8.3 10*3/uL (ref 3.4–10.8)

## 2021-08-20 LAB — BASIC METABOLIC PANEL
BUN/Creatinine Ratio: 13 (ref 12–28)
BUN: 10 mg/dL (ref 8–27)
CO2: 23 mmol/L (ref 20–29)
Calcium: 8.9 mg/dL (ref 8.7–10.3)
Chloride: 100 mmol/L (ref 96–106)
Creatinine, Ser: 0.76 mg/dL (ref 0.57–1.00)
Glucose: 109 mg/dL — ABNORMAL HIGH (ref 70–99)
Potassium: 4.4 mmol/L (ref 3.5–5.2)
Sodium: 135 mmol/L (ref 134–144)
eGFR: 76 mL/min/{1.73_m2} (ref >59)

## 2021-08-25 ENCOUNTER — Ambulatory Visit: Payer: Medicare Other | Admitting: Physical Medicine and Rehabilitation

## 2021-08-25 ENCOUNTER — Ambulatory Visit: Payer: Self-pay

## 2021-08-25 ENCOUNTER — Encounter: Payer: Self-pay | Admitting: Physical Medicine and Rehabilitation

## 2021-08-25 VITALS — BP 132/84 | HR 82

## 2021-08-25 DIAGNOSIS — M5416 Radiculopathy, lumbar region: Secondary | ICD-10-CM

## 2021-08-25 MED ORDER — METHYLPREDNISOLONE ACETATE 80 MG/ML IJ SUSP
80.0000 mg | Freq: Once | INTRAMUSCULAR | Status: AC
  Administered 2021-08-25: 80 mg

## 2021-08-25 NOTE — Progress Notes (Signed)
[name] - 88 y.o. female MRN [id number]  Date of birth: 1932/11/26  Office Visit Note: Visit Date: 08/25/2021 PCP: Garlan Fillers, MD Referred by: Garlan Fillers, MD  Subjective: Chief Complaint  Patient presents with   Lower Back - Pain   HPI:  [name] is a 88 y.o. female who comes in today at the request of Ellin Goodie, FNP for planned Bilateral L4-5 Lumbar Transforaminal epidural steroid injection with fluoroscopic guidance.  The patient has failed conservative care including home exercise, medications, time and activity modification.  This injection will be diagnostic and hopefully therapeutic.  Please see requesting physician notes for further details and justification.   ROS Otherwise per HPI.  Assessment & Plan: Visit Diagnoses:    ICD-10-CM   1. Lumbar radiculopathy  M54.16 XR C-ARM NO REPORT    Epidural Steroid injection    methylPREDNISolone acetate (DEPO-MEDROL) injection 80 mg      Plan: No additional findings.   Meds & Orders:  Meds ordered this encounter  Medications   methylPREDNISolone acetate (DEPO-MEDROL) injection 80 mg    Orders Placed This Encounter  Procedures   XR C-ARM NO REPORT   Epidural Steroid injection    Follow-up: Return if symptoms worsen or fail to improve.   Procedures: No procedures performed  Lumbosacral Transforaminal Epidural Steroid Injection - Sub-Pedicular Approach with Fluoroscopic Guidance  Patient: [name]      Date of Birth: [birth year] MRN: [id number] PCP: Garlan Fillers, MD      Visit Date: 08/25/2021   Universal Protocol:    Date/Time: 08/25/2021  Consent Given By: the patient  Position: PRONE  Additional Comments: Vital signs were monitored before and after the procedure. Patient was prepped and draped in the usual sterile fashion. The correct patient, procedure, and site was verified.   Injection Procedure Details:   Procedure diagnoses: Lumbar radiculopathy [M54.16]     Meds Administered:  Meds ordered this encounter  Medications   methylPREDNISolone acetate (DEPO-MEDROL) injection 80 mg    Laterality: Bilateral  Location/Site: L4  Needle:5.0 in., 22 ga.  Short bevel or Quincke spinal needle  Needle Placement: Transforaminal  Findings:    -Comments: Excellent flow of contrast along the nerve, nerve root and into the epidural space.  Procedure Details: After squaring off the end-plates to get a true AP view, the C-arm was positioned so that an oblique view of the foramen as noted above was visualized. The target area is just inferior to the "nose of the scotty dog" or sub pedicular. The soft tissues overlying this structure were infiltrated with 2-3 ml. of 1% Lidocaine without Epinephrine.  The spinal needle was inserted toward the target using a "trajectory" view along the fluoroscope beam.  Under AP and lateral visualization, the needle was advanced so it did not puncture dura and was located close the 6 O'Clock position of the pedical in AP tracterory. Biplanar projections were used to confirm position. Aspiration was confirmed to be negative for CSF and/or blood. A 1-2 ml. volume of Isovue-250 was injected and flow of contrast was noted at each level. Radiographs were obtained for documentation purposes.   After attaining the desired flow of contrast documented above, a 0.5 to 1.0 ml test dose of 0.25% Marcaine was injected into each respective transforaminal space.  The patient was observed for 90 seconds post injection.  After no sensory deficits were reported, and normal lower extremity motor function was noted,   the above injectate  was administered so that equal amounts of the injectate were placed at each foramen (level) into the transforaminal epidural space.   Additional Comments:  The patient tolerated the procedure well Dressing: 2 x 2 sterile gauze and Band-Aid    Post-procedure details: Patient was observed during the  procedure. Post-procedure instructions were reviewed.  Patient left the clinic in stable condition.    Clinical History: EXAM: MRI LUMBAR SPINE WITHOUT AND WITH CONTRAST   TECHNIQUE: Multiplanar and multiecho pulse sequences of the lumbar spine were obtained without and with intravenous contrast.   CONTRAST:  74mL GADAVIST GADOBUTROL 1 MMOL/ML IV SOLN   COMPARISON:  None.   FINDINGS: Significant motion artifact is present.   Segmentation:  Standard.   Alignment: Grade 1 anterolisthesis at L4-L5 mild retrolisthesis at L1-L2 and L3-L4.   Vertebrae: Vertebral body heights are maintained apart from degenerative endplate irregularity, greatest at L3. There is minor degenerative endplate marrow edema, for example at L5-S1. No suspicious osseous lesion. No evidence of discitis or osteomyelitis.   Conus medullaris and cauda equina: Conus extends to the L1 level. Conus is unremarkable. There is possible reactive nerve root enhancement at the L4-L5 level.   Paraspinal and other soft tissues: Partially image distended bladder. Small calcified uterine fibroid. Otherwise unremarkable.   Disc levels:   L1-L2: Disc bulge and mild facet arthropathy. No significant canal or foraminal stenosis.   L2-L3: Disc bulge and mild facet arthropathy with ligamentum flavum infolding. No significant canal or foraminal stenosis.   L3-L4: Disc bulge with endplate osteophytic ridging and moderate facet arthropathy with ligamentum flavum infolding. Moderate canal stenosis with narrowing of the lateral recesses. Mild foraminal stenosis.   L4-L5: Anterolisthesis with uncovering of disc bulge. Moderate right and marked left facet arthropathy with ligamentum flavum infolding. Severe canal stenosis with effacement of the lateral recesses and crowding of the cauda equina. Mild to moderate right and moderate left foraminal stenosis.   L5-S1: Disc bulge with endplate osteophytic ridging eccentric to  the left and moderate facet arthropathy with ligamentum flavum infolding. No significant canal or right foraminal stenosis. Mild left foraminal stenosis.   IMPRESSION: Motion degraded study.   No evidence of discitis/osteomyelitis.  No epidural collection   Multilevel degenerative changes as detailed above. Most notably, there is severe canal stenosis at L4-L5 with crowding of the cauda equina.     Electronically Signed   By: Guadlupe Spanish M.D.   On: 04/30/2019 19:16     Objective:  VS:  HT:    WT:   BMI:     BP:132/84  HR:82bpm  TEMP: ( )  RESP:  Physical Exam Vitals and nursing note reviewed.  Constitutional:      General: She is not in acute distress.    Appearance: Normal appearance. She is not ill-appearing.  HENT:     Head: Normocephalic and atraumatic.     Right Ear: External ear normal.     Left Ear: External ear normal.  Eyes:     Extraocular Movements: Extraocular movements intact.  Cardiovascular:     Rate and Rhythm: Normal rate.     Pulses: Normal pulses.  Pulmonary:     Effort: Pulmonary effort is normal. No respiratory distress.  Abdominal:     General: There is no distension.     Palpations: Abdomen is soft.  Musculoskeletal:        General: Tenderness present.     Cervical back: Neck supple.     Right lower leg: No edema.  Left lower leg: No edema.     Comments: Patient has good distal strength with no pain over the greater trochanters.  No clonus or focal weakness.  Skin:    Findings: No erythema, lesion or rash.  Neurological:     General: No focal deficit present.     Mental Status: She is alert and oriented to person, place, and time.     Sensory: No sensory deficit.     Motor: No weakness or abnormal muscle tone.     Coordination: Coordination normal.  Psychiatric:        Mood and Affect: Mood normal.        Behavior: Behavior normal.      Imaging: No results found.

## 2021-08-25 NOTE — Progress Notes (Signed)
Pt state lower back pain. Pt state sitting makes the pain worse. Pt state she takes pain meds to help ease her pain.  Numeric Pain Rating Scale and Functional Assessment Average Pain 10   In the last MONTH (on 0-10 scale) has pain interfered with the following?  1. General activity like being  able to carry out your everyday physical activities such as walking, climbing stairs, carrying groceries, or moving a chair?  Rating(10)   +Driver, -BT, -Dye Allergies.

## 2021-08-25 NOTE — Patient Instructions (Signed)

## 2021-09-20 NOTE — Procedures (Signed)
Lumbosacral Transforaminal Epidural Steroid Injection - Sub-Pedicular Approach with Fluoroscopic Guidance  Patient: [name]      Date of Birth: [birth year] MRN: [id number] PCP: Garlan Fillers, MD      Visit Date: 08/25/2021   Universal Protocol:    Date/Time: 08/25/2021  Consent Given By: the patient  Position: PRONE  Additional Comments: Vital signs were monitored before and after the procedure. Patient was prepped and draped in the usual sterile fashion. The correct patient, procedure, and site was verified.   Injection Procedure Details:   Procedure diagnoses: Lumbar radiculopathy [M54.16]    Meds Administered:  Meds ordered this encounter  Medications   methylPREDNISolone acetate (DEPO-MEDROL) injection 80 mg    Laterality: Bilateral  Location/Site: L4  Needle:5.0 in., 22 ga.  Short bevel or Quincke spinal needle  Needle Placement: Transforaminal  Findings:    -Comments: Excellent flow of contrast along the nerve, nerve root and into the epidural space.  Procedure Details: After squaring off the end-plates to get a true AP view, the C-arm was positioned so that an oblique view of the foramen as noted above was visualized. The target area is just inferior to the "nose of the scotty dog" or sub pedicular. The soft tissues overlying this structure were infiltrated with 2-3 ml. of 1% Lidocaine without Epinephrine.  The spinal needle was inserted toward the target using a "trajectory" view along the fluoroscope beam.  Under AP and lateral visualization, the needle was advanced so it did not puncture dura and was located close the 6 O'Clock position of the pedical in AP tracterory. Biplanar projections were used to confirm position. Aspiration was confirmed to be negative for CSF and/or blood. A 1-2 ml. volume of Isovue-250 was injected and flow of contrast was noted at each level. Radiographs were obtained for documentation purposes.   After attaining the  desired flow of contrast documented above, a 0.5 to 1.0 ml test dose of 0.25% Marcaine was injected into each respective transforaminal space.  The patient was observed for 90 seconds post injection.  After no sensory deficits were reported, and normal lower extremity motor function was noted,   the above injectate was administered so that equal amounts of the injectate were placed at each foramen (level) into the transforaminal epidural space.   Additional Comments:  The patient tolerated the procedure well Dressing: 2 x 2 sterile gauze and Band-Aid    Post-procedure details: Patient was observed during the procedure. Post-procedure instructions were reviewed.  Patient left the clinic in stable condition.

## 2021-09-21 ENCOUNTER — Other Ambulatory Visit: Payer: Self-pay | Admitting: Internal Medicine

## 2021-10-06 ENCOUNTER — Other Ambulatory Visit: Payer: Self-pay | Admitting: Internal Medicine

## 2021-10-26 ENCOUNTER — Ambulatory Visit: Payer: Medicare Other | Admitting: Family

## 2021-10-26 ENCOUNTER — Encounter: Payer: Self-pay | Admitting: Family

## 2021-10-26 DIAGNOSIS — M5416 Radiculopathy, lumbar region: Secondary | ICD-10-CM

## 2021-10-26 MED ORDER — PREDNISONE 10 MG PO TABS: 10.0000 mg | ORAL_TABLET | Freq: Every day | ORAL | 1 refills | Status: DC

## 2021-10-26 NOTE — Progress Notes (Signed)
Office Visit Note   Patient: [name]           Date of Birth: 1932/05/14           MRN: [id number] Visit Date: 10/26/2021              Requested by: Garlan Fillers, MD [address] [place],  [place] [address] PCP: Garlan Fillers, MD  Chief Complaint  Patient presents with   Lower Back - Pain   Left Hip - Pain      HPI: The patient is an 88 year old woman who presents today for acute on chronic low back pain with bilateral radiculopathy she is having left hip pain as well bilateral lateral thigh pain this extends down into her calves on the right her foot goes numb from time to time.  She complains of spasms and cramping in the posterior aspect of her legs sharp shooting pain she feels like she has to drag her right foot she is wearing an ankle brace for support.  She states that she had a funeral to attend she had to be up on her feet a significant amount for this event and got significantly worse since then with return of her sciatica type symptoms.  She did have an epidural steroid injection with Dr. Alvester Morin in June and this provided her with great interval relief she states this resolved all of her radicular symptoms for over a month  She is currently wearing a elastic back brace for support.  Currently ambulates with a rolling walker with a seat the brakes are not have worn out and are no longer working.  She fears falling.  Assessment & Plan: Visit Diagnoses:  1. Lumbar radiculopathy     Plan: We will place her back on low-dose prednisone until she can get in with Dr. Alvester Morin.  Referral to Dr. Alvester Morin provided today.  We will also place an order for new rollator.  Follow-Up Instructions: Return if symptoms worsen or fail to improve.   Ortho Exam  Patient is alert, oriented, no adenopathy, well-dressed, normal affect, normal respiratory effort. Do I just ask tooth  Imaging: No results found. No images are attached to the encounter.  Labs: Lab  Results  Component Value Date   HGBA1C 5.6 12/07/2015   ESRSEDRATE 2 11/19/2008   REPTSTATUS 05/01/2019 FINAL 04/30/2019   CULT (A) 04/30/2019    <10,000 COLONIES/mL INSIGNIFICANT GROWTH Performed at [facility], [address], [place], [place] [address]      Lab Results  Component Value Date   ALBUMIN 4.3 08/08/2021   ALBUMIN 4.4 06/10/2019   ALBUMIN 3.8 04/30/2019    Lab Results  Component Value Date   MG 1.9 11/19/2008   MG 2.0 03/31/2007   Lab Results  Component Value Date   VD25OH 49 12/11/2008    No results found for: "PREALBUMIN"    Latest Ref Rng & Units 08/19/2021   10:53 AM 08/08/2021    2:42 PM 07/27/2021   11:01 AM  CBC EXTENDED  WBC 3.4 - 10.8 x10E3/uL 8.3  12.5  15.7   RBC 3.77 - 5.28 x10E6/uL 3.69  4.03  4.40   Hemoglobin 11.1 - 15.9 g/dL 44.8  18.5  63.1   HCT 34.0 - 46.6 % 33.4  36.5  39.7   Platelets 150 - 450 x10E3/uL 182  216  248   NEUT# 1.4 - 7.0 x10E3/uL  10.5    Lymph# 0.7 - 3.1 x10E3/uL  0.9  There is no height or weight on file to calculate BMI.  Orders:  Orders Placed This Encounter  Procedures   Ambulatory referral to Physical Medicine Rehab   Meds ordered this encounter  Medications   predniSONE (DELTASONE) 10 MG tablet    Sig: Take 1 tablet (10 mg total) by mouth daily with breakfast.    Dispense:  30 tablet    Refill:  1     Procedures: No procedures performed  Clinical Data: No additional findings.  ROS:  All other systems negative, except as noted in the HPI. Review of Systems  Objective: Vital Signs: There were no vitals taken for this visit.  Specialty Comments:  EXAM: MRI LUMBAR SPINE WITHOUT AND WITH CONTRAST   TECHNIQUE: Multiplanar and multiecho pulse sequences of the lumbar spine were obtained without and with intravenous contrast.   CONTRAST:  32mL GADAVIST GADOBUTROL 1 MMOL/ML IV SOLN   COMPARISON:  None.   FINDINGS: Significant motion artifact is present.   Segmentation:   Standard.   Alignment: Grade 1 anterolisthesis at L4-L5 mild retrolisthesis at L1-L2 and L3-L4.   Vertebrae: Vertebral body heights are maintained apart from degenerative endplate irregularity, greatest at L3. There is minor degenerative endplate marrow edema, for example at L5-S1. No suspicious osseous lesion. No evidence of discitis or osteomyelitis.   Conus medullaris and cauda equina: Conus extends to the L1 level. Conus is unremarkable. There is possible reactive nerve root enhancement at the L4-L5 level.   Paraspinal and other soft tissues: Partially image distended bladder. Small calcified uterine fibroid. Otherwise unremarkable.   Disc levels:   L1-L2: Disc bulge and mild facet arthropathy. No significant canal or foraminal stenosis.   L2-L3: Disc bulge and mild facet arthropathy with ligamentum flavum infolding. No significant canal or foraminal stenosis.   L3-L4: Disc bulge with endplate osteophytic ridging and moderate facet arthropathy with ligamentum flavum infolding. Moderate canal stenosis with narrowing of the lateral recesses. Mild foraminal stenosis.   L4-L5: Anterolisthesis with uncovering of disc bulge. Moderate right and marked left facet arthropathy with ligamentum flavum infolding. Severe canal stenosis with effacement of the lateral recesses and crowding of the cauda equina. Mild to moderate right and moderate left foraminal stenosis.   L5-S1: Disc bulge with endplate osteophytic ridging eccentric to the left and moderate facet arthropathy with ligamentum flavum infolding. No significant canal or right foraminal stenosis. Mild left foraminal stenosis.   IMPRESSION: Motion degraded study.   No evidence of discitis/osteomyelitis.  No epidural collection   Multilevel degenerative changes as detailed above. Most notably, there is severe canal stenosis at L4-L5 with crowding of the cauda equina.     Electronically Signed   By: Guadlupe Spanish  M.D.   On: 04/30/2019 19:16  PMFS History: Patient Active Problem List   Diagnosis Date Noted   Chest pain 11/28/2018   Chest pain on breathing 12/06/2015   Exertional chest pain    COUGH 06/24/2009   HYPOPOTASSEMIA 12/31/2008   Pure hypercholesterolemia 12/16/2008   Hyperlipidemia, mixed 08/28/2008   HYPERTENSION, BENIGN 08/28/2008   S/P CABG x 3 08/28/2008   GERD 10/16/2007   Past Medical History:  Diagnosis Date   Anemia    Coronary artery disease    Diabetes mellitus    DJD (degenerative joint disease)    Dysrhythmia    Edema    GERD (gastroesophageal reflux disease)    Hiatal hernia    History of blood transfusion    Hyperlipidemia    Hypertension  Hypothyroidism    IBS (irritable bowel syndrome)    SVT (supraventricular tachycardia) (HCC)    with ablation    Family History  Problem Relation Age of Onset   Heart attack Mother 89       died   Heart attack Father 53       died   Breast cancer Sister    Diabetes Sister        and brother   Heart disease Other        sister, father and brother   Coronary artery disease Brother        PCI    Past Surgical History:  Procedure Laterality Date   APPENDECTOMY     BREAST SURGERY     biopsy per left breast    CARDIAC CATHETERIZATION     CHOLECYSTECTOMY  2005   CORONARY ARTERY BYPASS GRAFT  2000   EYE SURGERY     cataract surgery bilat    REPLACEMENT TOTAL KNEE  2009   left   Social History   Occupational History   Occupation: retired    Fish farm manager: RETIRED  Tobacco Use   Smoking status: Never   Smokeless tobacco: Never  Substance and Sexual Activity   Alcohol use: No   Drug use: No   Sexual activity: Not on file

## 2021-10-27 ENCOUNTER — Telehealth: Payer: Self-pay | Admitting: Orthopedic Surgery

## 2021-10-27 NOTE — Telephone Encounter (Signed)
Pending on [employer]

## 2021-10-27 NOTE — Telephone Encounter (Signed)
Pt needs a new rollator walker. Will put order in through [employer].

## 2021-10-27 NOTE — Telephone Encounter (Signed)
Per Darcel Bayley at [facility], pt has already been billed by them on 10/2019 and insurance will not cover another per 5 years.

## 2021-10-28 NOTE — Telephone Encounter (Signed)
Pt informed

## 2021-11-14 ENCOUNTER — Ambulatory Visit: Payer: Medicare Other | Admitting: Physical Medicine and Rehabilitation

## 2021-11-14 ENCOUNTER — Ambulatory Visit: Payer: Self-pay

## 2021-11-14 DIAGNOSIS — M5416 Radiculopathy, lumbar region: Secondary | ICD-10-CM

## 2021-11-14 DIAGNOSIS — M48062 Spinal stenosis, lumbar region with neurogenic claudication: Secondary | ICD-10-CM

## 2021-11-14 MED ORDER — METHYLPREDNISOLONE ACETATE 80 MG/ML IJ SUSP
80.0000 mg | Freq: Once | INTRAMUSCULAR | Status: AC
  Administered 2021-11-14: 80 mg

## 2021-11-14 NOTE — Progress Notes (Signed)
Numeric Pain Rating Scale and Functional Assessment Average Pain 9   In the last MONTH (on 0-10 scale) has pain interfered with the following?  1. General activity like being  able to carry out your everyday physical activities such as walking, climbing stairs, carrying groceries, or moving a chair?  Rating(10)   +Driver, -BT, -Dye Allergies.  136/62

## 2021-11-14 NOTE — Patient Instructions (Signed)

## 2021-11-23 NOTE — Progress Notes (Signed)
[name] - 88 y.o. female MRN [id number]  Date of birth: Jul 19, 1932  Office Visit Note: Visit Date: 11/14/2021 PCP: Donnajean Lopes, MD Referred by: Donnajean Lopes, MD  Subjective: Chief Complaint  Patient presents with   Lower Back - Pain   HPI:  [name] is a 88 y.o. female who comes in today at the request of Dr. Meridee Score for planned Midline L5-S1 Lumbar Interlaminar epidural steroid injection with fluoroscopic guidance.  The patient has failed conservative care including home exercise, medications, time and activity modification.  This injection will be diagnostic and hopefully therapeutic.  Please see requesting physician notes for further details and justification.   She has severe stenosis at L4-5 and typically requires 3 explanation of her condition but has done well intermittently with transforaminal injection at this level.  Last injection was not very beneficial however.  We will try an interlaminar approach today below the level of stenosis and see how that does.  She also talks today about her right foot and ankle.  She asked me who she should see for that but she actually has been a patient of Dr. Jess Barters for some time.  We will get her reevaluated and an appointment with him for follow-up.   ROS Otherwise per HPI.  Assessment & Plan: Visit Diagnoses:    ICD-10-CM   1. Lumbar radiculopathy  M54.16 XR C-ARM NO REPORT    Epidural Steroid injection    methylPREDNISolone acetate (DEPO-MEDROL) injection 80 mg    2. Spinal stenosis of lumbar region with neurogenic claudication  M48.062 XR C-ARM NO REPORT    Epidural Steroid injection    methylPREDNISolone acetate (DEPO-MEDROL) injection 80 mg      Plan: No additional findings.   Meds & Orders:  Meds ordered this encounter  Medications   methylPREDNISolone acetate (DEPO-MEDROL) injection 80 mg    Orders Placed This Encounter  Procedures   XR C-ARM NO REPORT   Epidural Steroid injection     Follow-up: Return for Meridee Score, MD for right foot.   Procedures: No procedures performed  Lumbar Epidural Steroid Injection - Interlaminar Approach with Fluoroscopic Guidance  Patient: [name]      Date of Birth: September 26, 1932 MRN: [id number] PCP: Donnajean Lopes, MD      Visit Date: 11/14/2021   Universal Protocol:     Consent Given By: the patient  Position: PRONE  Additional Comments: Vital signs were monitored before and after the procedure. Patient was prepped and draped in the usual sterile fashion. The correct patient, procedure, and site was verified.   Injection Procedure Details:   Procedure diagnoses: Lumbar radiculopathy [M54.16]   Meds Administered:  Meds ordered this encounter  Medications   methylPREDNISolone acetate (DEPO-MEDROL) injection 80 mg     Laterality: Midline  Location/Site:  L5-S1  Needle: 3.5 in., 20 ga. Tuohy  Needle Placement: Paramedian epidural  Findings:   -Comments: Excellent flow of contrast into the epidural space.  Procedure Details: Using a paramedian approach from the side mentioned above, the region overlying the inferior lamina was localized under fluoroscopic visualization and the soft tissues overlying this structure were infiltrated with 4 ml. of 1% Lidocaine without Epinephrine. The Tuohy needle was inserted into the epidural space using a paramedian approach.   The epidural space was localized using loss of resistance along with counter oblique bi-planar fluoroscopic views.  After negative aspirate for air, blood, and CSF, a 2 ml. volume of Isovue-250 was injected  into the epidural space and the flow of contrast was observed. Radiographs were obtained for documentation purposes.    The injectate was administered into the level noted above.   Additional Comments:  The patient tolerated the procedure well Dressing: 2 x 2 sterile gauze and Band-Aid    Post-procedure details: Patient was observed during  the procedure. Post-procedure instructions were reviewed.  Patient left the clinic in stable condition.   Clinical History: EXAM: MRI LUMBAR SPINE WITHOUT AND WITH CONTRAST   TECHNIQUE: Multiplanar and multiecho pulse sequences of the lumbar spine were obtained without and with intravenous contrast.   CONTRAST:  63mL GADAVIST GADOBUTROL 1 MMOL/ML IV SOLN   COMPARISON:  None.   FINDINGS: Significant motion artifact is present.   Segmentation:  Standard.   Alignment: Grade 1 anterolisthesis at L4-L5 mild retrolisthesis at L1-L2 and L3-L4.   Vertebrae: Vertebral body heights are maintained apart from degenerative endplate irregularity, greatest at L3. There is minor degenerative endplate marrow edema, for example at L5-S1. No suspicious osseous lesion. No evidence of discitis or osteomyelitis.   Conus medullaris and cauda equina: Conus extends to the L1 level. Conus is unremarkable. There is possible reactive nerve root enhancement at the L4-L5 level.   Paraspinal and other soft tissues: Partially image distended bladder. Small calcified uterine fibroid. Otherwise unremarkable.   Disc levels:   L1-L2: Disc bulge and mild facet arthropathy. No significant canal or foraminal stenosis.   L2-L3: Disc bulge and mild facet arthropathy with ligamentum flavum infolding. No significant canal or foraminal stenosis.   L3-L4: Disc bulge with endplate osteophytic ridging and moderate facet arthropathy with ligamentum flavum infolding. Moderate canal stenosis with narrowing of the lateral recesses. Mild foraminal stenosis.   L4-L5: Anterolisthesis with uncovering of disc bulge. Moderate right and marked left facet arthropathy with ligamentum flavum infolding. Severe canal stenosis with effacement of the lateral recesses and crowding of the cauda equina. Mild to moderate right and moderate left foraminal stenosis.   L5-S1: Disc bulge with endplate osteophytic ridging eccentric to  the left and moderate facet arthropathy with ligamentum flavum infolding. No significant canal or right foraminal stenosis. Mild left foraminal stenosis.   IMPRESSION: Motion degraded study.   No evidence of discitis/osteomyelitis.  No epidural collection   Multilevel degenerative changes as detailed above. Most notably, there is severe canal stenosis at L4-L5 with crowding of the cauda equina.     Electronically Signed   By: Macy Mis M.D.   On: 04/30/2019 19:16     Objective:  VS:  HT:    WT:   BMI:     BP:   HR: bpm  TEMP: ( )  RESP:  Physical Exam Vitals and nursing note reviewed.  Constitutional:      General: She is not in acute distress.    Appearance: Normal appearance. She is not ill-appearing.  HENT:     Head: Normocephalic and atraumatic.     Right Ear: External ear normal.     Left Ear: External ear normal.  Eyes:     Extraocular Movements: Extraocular movements intact.  Cardiovascular:     Rate and Rhythm: Normal rate.     Pulses: Normal pulses.  Pulmonary:     Effort: Pulmonary effort is normal. No respiratory distress.  Abdominal:     General: There is no distension.     Palpations: Abdomen is soft.  Musculoskeletal:        General: Tenderness present.     Cervical back: Neck  supple.     Right lower leg: No edema.     Left lower leg: No edema.     Comments: Patient has good distal strength with no pain over the greater trochanters.  No clonus or focal weakness.  Skin:    Findings: No erythema, lesion or rash.  Neurological:     General: No focal deficit present.     Mental Status: She is alert and oriented to person, place, and time.     Sensory: No sensory deficit.     Motor: No weakness or abnormal muscle tone.     Coordination: Coordination normal.  Psychiatric:        Mood and Affect: Mood normal.        Behavior: Behavior normal.      Imaging: No results found.

## 2021-11-23 NOTE — Procedures (Signed)
Lumbar Epidural Steroid Injection - Interlaminar Approach with Fluoroscopic Guidance  Patient: [name]      Date of Birth: 14-Jan-1933 MRN: [id number] PCP: Donnajean Lopes, MD      Visit Date: 11/14/2021   Universal Protocol:     Consent Given By: the patient  Position: PRONE  Additional Comments: Vital signs were monitored before and after the procedure. Patient was prepped and draped in the usual sterile fashion. The correct patient, procedure, and site was verified.   Injection Procedure Details:   Procedure diagnoses: Lumbar radiculopathy [M54.16]   Meds Administered:  Meds ordered this encounter  Medications   methylPREDNISolone acetate (DEPO-MEDROL) injection 80 mg     Laterality: Midline  Location/Site:  L5-S1  Needle: 3.5 in., 20 ga. Tuohy  Needle Placement: Paramedian epidural  Findings:   -Comments: Excellent flow of contrast into the epidural space.  Procedure Details: Using a paramedian approach from the side mentioned above, the region overlying the inferior lamina was localized under fluoroscopic visualization and the soft tissues overlying this structure were infiltrated with 4 ml. of 1% Lidocaine without Epinephrine. The Tuohy needle was inserted into the epidural space using a paramedian approach.   The epidural space was localized using loss of resistance along with counter oblique bi-planar fluoroscopic views.  After negative aspirate for air, blood, and CSF, a 2 ml. volume of Isovue-250 was injected into the epidural space and the flow of contrast was observed. Radiographs were obtained for documentation purposes.    The injectate was administered into the level noted above.   Additional Comments:  The patient tolerated the procedure well Dressing: 2 x 2 sterile gauze and Band-Aid    Post-procedure details: Patient was observed during the procedure. Post-procedure instructions were reviewed.  Patient left the clinic in stable  condition.

## 2021-11-26 ENCOUNTER — Other Ambulatory Visit: Payer: Self-pay

## 2021-11-26 ENCOUNTER — Emergency Department (HOSPITAL_COMMUNITY): Payer: Medicare Other

## 2021-11-26 ENCOUNTER — Observation Stay (HOSPITAL_COMMUNITY)
Admission: EM | Admit: 2021-11-26 | Discharge: 2021-11-29 | Disposition: A | Discharge status: Skilled Nursing Facility | Payer: Medicare Other | Attending: Internal Medicine | Admitting: Internal Medicine

## 2021-11-26 ENCOUNTER — Encounter (HOSPITAL_COMMUNITY): Payer: Self-pay

## 2021-11-26 DIAGNOSIS — E782 Mixed hyperlipidemia: Secondary | ICD-10-CM | POA: Insufficient documentation

## 2021-11-26 DIAGNOSIS — E871 Hypo-osmolality and hyponatremia: Secondary | ICD-10-CM | POA: Insufficient documentation

## 2021-11-26 DIAGNOSIS — W19XXXA Unspecified fall, initial encounter: Secondary | ICD-10-CM | POA: Insufficient documentation

## 2021-11-26 DIAGNOSIS — Z7982 Long term (current) use of aspirin: Secondary | ICD-10-CM | POA: Insufficient documentation

## 2021-11-26 DIAGNOSIS — S32048A Other fracture of fourth lumbar vertebra, initial encounter for closed fracture: Secondary | ICD-10-CM | POA: Insufficient documentation

## 2021-11-26 DIAGNOSIS — S32058A Other fracture of fifth lumbar vertebra, initial encounter for closed fracture: Principal | ICD-10-CM | POA: Insufficient documentation

## 2021-11-26 DIAGNOSIS — Z96652 Presence of left artificial knee joint: Secondary | ICD-10-CM | POA: Diagnosis not present

## 2021-11-26 DIAGNOSIS — Z951 Presence of aortocoronary bypass graft: Secondary | ICD-10-CM | POA: Insufficient documentation

## 2021-11-26 DIAGNOSIS — I10 Essential (primary) hypertension: Secondary | ICD-10-CM | POA: Diagnosis not present

## 2021-11-26 DIAGNOSIS — E559 Vitamin D deficiency, unspecified: Secondary | ICD-10-CM | POA: Diagnosis not present

## 2021-11-26 DIAGNOSIS — M5416 Radiculopathy, lumbar region: Secondary | ICD-10-CM | POA: Diagnosis not present

## 2021-11-26 DIAGNOSIS — I2510 Atherosclerotic heart disease of native coronary artery without angina pectoris: Secondary | ICD-10-CM | POA: Diagnosis not present

## 2021-11-26 DIAGNOSIS — S22088A Other fracture of T11-T12 vertebra, initial encounter for closed fracture: Secondary | ICD-10-CM | POA: Diagnosis not present

## 2021-11-26 DIAGNOSIS — S32038A Other fracture of third lumbar vertebra, initial encounter for closed fracture: Secondary | ICD-10-CM | POA: Diagnosis not present

## 2021-11-26 DIAGNOSIS — Z79899 Other long term (current) drug therapy: Secondary | ICD-10-CM | POA: Insufficient documentation

## 2021-11-26 DIAGNOSIS — M48061 Spinal stenosis, lumbar region without neurogenic claudication: Secondary | ICD-10-CM | POA: Diagnosis present

## 2021-11-26 DIAGNOSIS — D72829 Elevated white blood cell count, unspecified: Secondary | ICD-10-CM | POA: Diagnosis not present

## 2021-11-26 DIAGNOSIS — M4850XA Collapsed vertebra, not elsewhere classified, site unspecified, initial encounter for fracture: Secondary | ICD-10-CM | POA: Diagnosis present

## 2021-11-26 DIAGNOSIS — E039 Hypothyroidism, unspecified: Secondary | ICD-10-CM | POA: Insufficient documentation

## 2021-11-26 DIAGNOSIS — E1169 Type 2 diabetes mellitus with other specified complication: Secondary | ICD-10-CM | POA: Insufficient documentation

## 2021-11-26 DIAGNOSIS — M549 Dorsalgia, unspecified: Secondary | ICD-10-CM | POA: Diagnosis present

## 2021-11-26 DIAGNOSIS — M545 Low back pain, unspecified: Secondary | ICD-10-CM

## 2021-11-26 LAB — SEDIMENTATION RATE: Sed Rate: 7 mm/hr (ref 0–22)

## 2021-11-26 LAB — BASIC METABOLIC PANEL
Anion gap: 12 (ref 5–15)
BUN: 16 mg/dL (ref 8–23)
CO2: 24 mmol/L (ref 22–32)
Calcium: 9.6 mg/dL (ref 8.9–10.3)
Chloride: 94 mmol/L — ABNORMAL LOW (ref 98–111)
Creatinine, Ser: 0.86 mg/dL (ref 0.44–1.00)
GFR, Estimated: >60 mL/min (ref >60)
Glucose, Bld: 160 mg/dL — ABNORMAL HIGH (ref 70–99)
Potassium: 4.4 mmol/L (ref 3.5–5.1)
Sodium: 130 mmol/L — ABNORMAL LOW (ref 135–145)

## 2021-11-26 LAB — C-REACTIVE PROTEIN: CRP: 1 mg/dL — ABNORMAL HIGH (ref <1.0)

## 2021-11-26 LAB — CBC WITH DIFFERENTIAL/PLATELET
Abs Immature Granulocytes: 0.72 10*3/uL — ABNORMAL HIGH (ref 0.00–0.07)
Basophils Absolute: 0.1 10*3/uL (ref 0.0–0.1)
Basophils Relative: 0 %
Eosinophils Absolute: 0 10*3/uL (ref 0.0–0.5)
Eosinophils Relative: 0 %
HCT: 40.8 % (ref 36.0–46.0)
Hemoglobin: 14.6 g/dL (ref 12.0–15.0)
Immature Granulocytes: 3 %
Lymphocytes Relative: 5 %
Lymphs Abs: 1.1 10*3/uL (ref 0.7–4.0)
MCH: 33 pg (ref 26.0–34.0)
MCHC: 35.8 g/dL (ref 30.0–36.0)
MCV: 92.3 fL (ref 80.0–100.0)
Monocytes Absolute: 1.3 10*3/uL — ABNORMAL HIGH (ref 0.1–1.0)
Monocytes Relative: 6 %
Neutro Abs: 19.7 10*3/uL — ABNORMAL HIGH (ref 1.7–7.7)
Neutrophils Relative %: 86 %
Platelets: 221 10*3/uL (ref 150–400)
RBC: 4.42 MIL/uL (ref 3.87–5.11)
RDW: 13.1 % (ref 11.5–15.5)
WBC: 22.8 10*3/uL — ABNORMAL HIGH (ref 4.0–10.5)
nRBC: 0 % (ref 0.0–0.2)

## 2021-11-26 LAB — TROPONIN I (HIGH SENSITIVITY): Troponin I (High Sensitivity): 13 ng/L (ref <18)

## 2021-11-26 LAB — MAGNESIUM: Magnesium: 1.9 mg/dL (ref 1.7–2.4)

## 2021-11-26 MED ORDER — EZETIMIBE 10 MG PO TABS
5.0000 mg | ORAL_TABLET | Freq: Every day | ORAL | Status: DC
  Administered 2021-11-27 – 2021-11-29 (×3): 5 mg via ORAL
  Filled 2021-11-26 (×3): qty 1

## 2021-11-26 MED ORDER — LIDOCAINE 5 % EX PTCH
1.0000 | MEDICATED_PATCH | Freq: Every day | CUTANEOUS | Status: DC
  Administered 2021-11-26 – 2021-11-28 (×3): 1 via TRANSDERMAL
  Filled 2021-11-26 (×3): qty 1

## 2021-11-26 MED ORDER — KETOROLAC TROMETHAMINE 15 MG/ML IJ SOLN: 15.0000 mg | Freq: Once | INTRAMUSCULAR | Status: DC

## 2021-11-26 MED ORDER — ACETAMINOPHEN 500 MG PO TABS
1000.0000 mg | ORAL_TABLET | Freq: Four times a day (QID) | ORAL | Status: DC | PRN
  Administered 2021-11-26: 1000 mg via ORAL
  Filled 2021-11-26: qty 2

## 2021-11-26 MED ORDER — ASPIRIN 81 MG PO TBEC
81.0000 mg | DELAYED_RELEASE_TABLET | Freq: Every day | ORAL | Status: DC
  Administered 2021-11-27 – 2021-11-29 (×3): 81 mg via ORAL
  Filled 2021-11-26 (×3): qty 1

## 2021-11-26 MED ORDER — PANTOPRAZOLE SODIUM 40 MG PO TBEC
40.0000 mg | DELAYED_RELEASE_TABLET | Freq: Every day | ORAL | Status: DC
  Administered 2021-11-27 – 2021-11-29 (×3): 40 mg via ORAL
  Filled 2021-11-26 (×3): qty 1

## 2021-11-26 MED ORDER — LEVOTHYROXINE SODIUM 75 MCG PO TABS
75.0000 ug | ORAL_TABLET | Freq: Every day | ORAL | Status: DC
  Administered 2021-11-27 – 2021-11-29 (×3): 75 ug via ORAL
  Filled 2021-11-26 (×3): qty 1

## 2021-11-26 MED ORDER — ONDANSETRON HCL 4 MG PO TABS: 4.0000 mg | ORAL_TABLET | Freq: Four times a day (QID) | ORAL | Status: DC | PRN

## 2021-11-26 MED ORDER — GADOPICLENOL 0.5 MMOL/ML IV SOLN
6.0000 mL | Freq: Once | INTRAVENOUS | Status: AC | PRN
  Administered 2021-11-26: 6 mL via INTRAVENOUS

## 2021-11-26 MED ORDER — OXYCODONE HCL 5 MG PO TABS: 5.0000 mg | ORAL_TABLET | ORAL | Status: DC | PRN

## 2021-11-26 MED ORDER — ONDANSETRON HCL 4 MG/2ML IJ SOLN: 4.0000 mg | Freq: Four times a day (QID) | INTRAMUSCULAR | Status: DC | PRN

## 2021-11-26 MED ORDER — ENOXAPARIN SODIUM 40 MG/0.4ML IJ SOSY
40.0000 mg | PREFILLED_SYRINGE | INTRAMUSCULAR | Status: DC
  Administered 2021-11-26 – 2021-11-28 (×3): 40 mg via SUBCUTANEOUS
  Filled 2021-11-26 (×3): qty 0.4

## 2021-11-26 MED ORDER — ROSUVASTATIN CALCIUM 5 MG PO TABS
10.0000 mg | ORAL_TABLET | Freq: Every day | ORAL | Status: DC
  Administered 2021-11-27 – 2021-11-29 (×3): 10 mg via ORAL
  Filled 2021-11-26 (×3): qty 2

## 2021-11-26 MED ORDER — AMLODIPINE BESYLATE 10 MG PO TABS
10.0000 mg | ORAL_TABLET | Freq: Every day | ORAL | Status: DC
  Administered 2021-11-27 – 2021-11-29 (×3): 10 mg via ORAL
  Filled 2021-11-26: qty 1
  Filled 2021-11-26: qty 2
  Filled 2021-11-26: qty 1

## 2021-11-26 MED ORDER — ACETAMINOPHEN 650 MG RE SUPP: 650.0000 mg | Freq: Four times a day (QID) | RECTAL | Status: DC | PRN

## 2021-11-26 MED ORDER — TRAMADOL HCL 50 MG PO TABS
50.0000 mg | ORAL_TABLET | Freq: Two times a day (BID) | ORAL | Status: DC | PRN
  Administered 2021-11-27: 50 mg via ORAL
  Filled 2021-11-26: qty 1

## 2021-11-26 MED ORDER — METOPROLOL SUCCINATE ER 25 MG PO TB24
25.0000 mg | ORAL_TABLET | Freq: Every day | ORAL | Status: DC
  Administered 2021-11-27 – 2021-11-29 (×3): 25 mg via ORAL
  Filled 2021-11-26 (×3): qty 1

## 2021-11-26 MED ORDER — LISINOPRIL 10 MG PO TABS
10.0000 mg | ORAL_TABLET | Freq: Every day | ORAL | Status: DC
  Administered 2021-11-27 – 2021-11-29 (×3): 10 mg via ORAL
  Filled 2021-11-26 (×3): qty 1

## 2021-11-26 MED ORDER — SENNOSIDES-DOCUSATE SODIUM 8.6-50 MG PO TABS: 1.0000 | ORAL_TABLET | Freq: Every evening | ORAL | Status: DC | PRN

## 2021-11-26 MED ORDER — KETOROLAC TROMETHAMINE 15 MG/ML IJ SOLN
15.0000 mg | Freq: Once | INTRAMUSCULAR | Status: AC
  Administered 2021-11-26: 15 mg via INTRAVENOUS
  Filled 2021-11-26: qty 1

## 2021-11-26 NOTE — Assessment & Plan Note (Signed)
Severe L4-L5 spinal stenosis with chronic lumbar radiculopathy Presenting with acute on chronic low back pain due to multilevel vertebral compression fractures T11-L5.  L5 fracture is acute with 70% vertebral body height loss.  T12, L3, and L4 compression fractures are subacute.  Normally ambulates with rolling walker, unable to do so now due to acute compression fracture. -EDP spoke with neurosurgery, no role for surgical intervention -PT/OT eval -Tylenol, tramadol prn, Lidoderm patch for analgesia -Limit systemic steroids going forward

## 2021-11-26 NOTE — Assessment & Plan Note (Signed)
Continue amlodipine, Toprol-XL, lisinopril.  Holding Lasix.

## 2021-11-26 NOTE — ED Notes (Signed)
Pt currently in xray

## 2021-11-26 NOTE — ED Provider Notes (Signed)
[facility] EMERGENCY DEPARTMENT Provider Note  CSN: [id number] Arrival date & time: 11/26/21 1047  Chief Complaint(s) Back Pain  HPI [name] is a 88 y.o. female with PMH CAD, T2DM, hiatal hernia, HTN, HLD, hypothyroidism, known severe lumbar spinal stenosis who presents emergency department for evaluation of back pain.  She currently follows with [facility] and is receiving injections but she feels like over the last year these have not really been helping her.  She states of the last week her back pain has significantly worsened but denies numbness, tingling, weakness or saddle anesthesia.  Difficult to assess urinary retention and incontinence as the patient states that over the last 1 year she has been wearing depends because of frequent episodes of urinary incontinence if she does not get to the bathroom fast enough.  There are initial triage notes about chest pain, but on my evaluation, patient is denying chest pain, shortness of breath, headache, fever or other systemic symptoms.   Past Medical History Past Medical History:  Diagnosis Date   Anemia    Coronary artery disease    Diabetes mellitus    DJD (degenerative joint disease)    Dysrhythmia    Edema    GERD (gastroesophageal reflux disease)    Hiatal hernia    History of blood transfusion    Hyperlipidemia    Hypertension    Hypothyroidism    IBS (irritable bowel syndrome)    SVT (supraventricular tachycardia) ([place])    with ablation   Patient Active Problem List   Diagnosis Date Noted   Chest pain 11/28/2018   Chest pain on breathing 12/06/2015   Exertional chest pain    COUGH 06/24/2009   HYPOPOTASSEMIA 12/31/2008   Pure hypercholesterolemia 12/16/2008   Hyperlipidemia, mixed 08/28/2008   HYPERTENSION, BENIGN 08/28/2008   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 Authorizing Provider   acetaminophen (TYLENOL) 325 MG tablet Take 2 tablets (650 mg total) by mouth every 6 (six) hours as needed for up to 30 doses for mild pain or moderate pain. 04/30/19   Wyvonnia Dusky, MD  amLODipine (NORVASC) 10 MG tablet TAKE 1 TABLET BY MOUTH EVERY DAY 09/21/21   Fay Records, MD  aspirin 81 MG tablet Take 81 mg by mouth daily.    [provider]  calcium carbonate (TUMS - DOSED IN MG ELEMENTAL CALCIUM) 500 MG chewable tablet Chew 1 tablet by mouth as needed for indigestion or heartburn.    [provider]  ergocalciferol (VITAMIN D2) 50000 UNITS capsule Take 50,000 Units by mouth once a week.    [provider]  esomeprazole (NEXIUM) 20 MG capsule Take 20 mg by mouth daily before breakfast.    [provider]  ezetimibe (ZETIA) 10 MG tablet TAKE 1/2 TABLET BY MOUTH EVERY DAY 10/06/21   Fay Records, MD  furosemide (LASIX) 20 MG tablet TAKE 1 TABLET BY MOUTH EVERY DAY 01/17/21   Fay Records, MD  KLOR-CON M20 20 MEQ tablet TAKE 1 TABLET BY MOUTH EVERY DAY 01/17/21   Fay Records, MD  levothyroxine (SYNTHROID, LEVOTHROID) 75 MCG tablet Take 75 mcg by mouth daily.    [provider]  lidocaine (LIDODERM) 5 % PLACE 1 PATCH ONTO THE SKIN DAILY. REMOVE & DISCARD PATCH WITHIN 12 HOURS OR AS DIRECTED BY MD 10/27/19   Newt Minion, MD  lisinopril (ZESTRIL) 10 MG tablet TAKE 1 TABLET BY MOUTH EVERY DAY 04/19/21   Fay Records, MD  methocarbamol (ROBAXIN) 500 MG tablet Take 1 tablet (500 mg total) by mouth 3 (three) times daily. 07/23/19   Suzan Slick, NP  metoprolol succinate (TOPROL-XL) 25 MG 24 hr tablet TAKE 1 TABLET BY MOUTH EVERY DAY 01/17/21   Fay Records, MD  nitroGLYCERIN (NITROSTAT) 0.4 MG SL tablet Place 1 tablet (0.4 mg total) under the tongue every 5 (five) minutes x 3 doses as needed for chest pain. 12/07/15   Strader, Fransisco Hertz, PA-C  ONETOUCH VERIO test strip CHECK BLOOD SUGARS ONCE DAILY 05/06/19   [provider]  predniSONE  (DELTASONE) 10 MG tablet Take 1 tablet (10 mg total) by mouth daily with breakfast. 10/26/21   Suzan Slick, NP  rosuvastatin (CRESTOR) 10 MG tablet TAKE 1 TABLET BY MOUTH EVERY DAY 01/17/21   Fay Records, MD  traMADol (ULTRAM) 50 MG tablet Take 1 tablet (50 mg total) by mouth every 6 (six) hours as needed. 06/25/19   Suzan Slick, NP                                                                                                                                    Past Surgical History Past Surgical History:  Procedure Laterality Date   APPENDECTOMY     BREAST SURGERY     biopsy per left breast    CARDIAC CATHETERIZATION     CHOLECYSTECTOMY  2005   CORONARY ARTERY BYPASS GRAFT  2000   EYE SURGERY     cataract surgery bilat    REPLACEMENT TOTAL KNEE  2009   left   Family History Family History  Problem Relation Age of Onset   Heart attack Mother 70       died   Heart attack Father 57       died   Breast cancer Sister    Diabetes Sister        and brother   Heart disease Other        sister, father and brother   Coronary artery disease Brother        PCI    Social History Social History   Tobacco Use   Smoking status: Never   Smokeless tobacco: Never  Substance Use Topics   Alcohol use: No   Drug use: No   Allergies Actos [pioglitazone hydrochloride], Codeine, and Lipitor [atorvastatin]  Review of Systems Review of Systems  Musculoskeletal:  Positive for back pain.    Physical Exam Vital Signs  I have reviewed the triage vital signs BP (!) 160/78   Pulse 81   Temp 98 F (36.7 C)   Resp 16   SpO2 100%   Physical Exam Vitals and nursing note reviewed.  Constitutional:      General: She is not in acute distress.    Appearance:  She is well-developed.  HENT:     Head: Normocephalic and atraumatic.  Eyes:     Conjunctiva/sclera: Conjunctivae normal.  Cardiovascular:     Rate and Rhythm: Normal rate and regular rhythm.     Heart sounds: No murmur  heard. Pulmonary:     Effort: Pulmonary effort is normal. No respiratory distress.     Breath sounds: Normal breath sounds.  Abdominal:     Palpations: Abdomen is soft.     Tenderness: There is no abdominal tenderness.  Musculoskeletal:        General: Tenderness present. No swelling.     Cervical back: Neck supple.  Skin:    General: Skin is warm and dry.     Capillary Refill: Capillary refill takes less than 2 seconds.  Neurological:     Mental Status: She is alert.  Psychiatric:        Mood and Affect: Mood normal.     ED Results and Treatments Labs (all labs ordered are listed, but only abnormal results are displayed) Labs Reviewed  CBC WITH DIFFERENTIAL/PLATELET - Abnormal; Notable for the following components:      Result Value   WBC 22.8 (*)    Neutro Abs 19.7 (*)    Monocytes Absolute 1.3 (*)    Abs Immature Granulocytes 0.72 (*)    All other components within normal limits  BASIC METABOLIC PANEL - Abnormal; Notable for the following components:   Sodium 130 (*)    Chloride 94 (*)    Glucose, Bld 160 (*)    All other components within normal limits  C-REACTIVE PROTEIN - Abnormal; Notable for the following components:   CRP 1.0 (*)    All other components within normal limits  MAGNESIUM  SEDIMENTATION RATE  TROPONIN I (HIGH SENSITIVITY)                                                                                                                          Radiology DG Chest 2 View  Result Date: 11/26/2021 CLINICAL DATA:  Low back pain.  Worsening weakness. EXAM: CHEST - 2 VIEW COMPARISON:  07/15/2019. FINDINGS: Stable changes from previous CABG surgery. Cardiac silhouette mildly enlarged. No mediastinal or hilar masses. No evidence of adenopathy. Clear lungs.  No pleural effusion or pneumothorax. Skeletal structures are demineralized. Multiple vertebral compression fractures T3, T7 and T11. IMPRESSION: No acute cardiopulmonary disease. Electronically Signed   By:  Lajean Manes M.D.   On: 11/26/2021 15:52   DG Lumbar Spine Complete  Result Date: 11/26/2021 CLINICAL DATA:  Low back pain.  Worsening weakness. EXAM: LUMBAR SPINE - COMPLETE 4+ VIEW COMPARISON:  07/12/2021. FINDINGS: Multiple compression fractures, L1, L2 and L5, unchanged from the prior exam. Grade 1 anterolisthesis of L4 on L5, also stable. No other spondylolisthesis. Moderate to marked loss of disc height at L3-L4. Mild loss of disc height at T12-L1. Mild facet degenerative changes at L4-L5 and L5-S1. Skeletal structures are diffusely demineralized. Aortic atherosclerotic calcifications.  Right upper quadrant vascular clips. Soft tissues otherwise unremarkable. IMPRESSION: 1. No acute fracture or acute finding. 2. Chronic compression fractures and degenerative changes as detailed, without change from the prior radiographs. Electronically Signed   By: Lajean Manes M.D.   On: 11/26/2021 15:49    Pertinent labs & imaging results that were available during my care of the patient were reviewed by me and considered in my medical decision making (see MDM for details).  Medications Ordered in ED Medications  ketorolac (TORADOL) 15 MG/ML injection 15 mg (15 mg Intravenous Given 11/26/21 1638)  gadopiclenol (VUEWAY) 0.5 MMOL/ML solution 6 mL (6 mLs Intravenous Contrast Given 11/26/21 1710)                                                                                                                                     Procedures Procedures  (including critical care time)  Medical Decision Making / ED Course   This patient presents to the ED for concern of back pain, this involves an extensive number of treatment options, and is a complaint that carries with it a high risk of complications and morbidity.  The differential diagnosis includes worsening spinal stenosis, fracture, ligamentous injury, musculoskeletal strain, pyelonephritis, nephrolithiasis  MDM: Patient seen emergency room for  evaluation of back pain.  Physical exam with tenderness all along the lower T-spine and L-spine, neurologic exam largely unremarkable.  Laboratory evaluation with a leukocytosis to 22.8, sed rate normal and CRP 1.0.  An MRI of the L-spine was obtained given patient's incontinence and known history of spinal stenosis with overall worry for worsening of her spinal stenosis.  MRI with vertebral compression fractures at T11, T12, L1, L2, L3, L4 and L5 with concerned that the L5 compression fracture is likely acute.  There is also significant spinal canal stenosis that is similar to previous.  I did speak with Dr. Christella Noa of neurosurgery who states that the patient is likely not a surgical candidate and would benefit from LSO bracing and physical therapy but the patient likely is not suffering from cauda equina syndrome at this time based on her current MRI.  Patient was then admitted to medicine for pain control, physical therapy in the setting of an acute lumbar fracture   Additional history obtained: -Additional history obtained from daughter -External records from outside source obtained and reviewed including: Chart review including previous notes, labs, imaging, consultation notes   Lab Tests: -I ordered, reviewed, and interpreted labs.   The pertinent results include:   Labs Reviewed  CBC WITH DIFFERENTIAL/PLATELET - Abnormal; Notable for the following components:      Result Value   WBC 22.8 (*)    Neutro Abs 19.7 (*)    Monocytes Absolute 1.3 (*)    Abs Immature Granulocytes 0.72 (*)    All other components within normal limits  BASIC METABOLIC PANEL - Abnormal; Notable for the following components:   Sodium  130 (*)    Chloride 94 (*)    Glucose, Bld 160 (*)    All other components within normal limits  C-REACTIVE PROTEIN - Abnormal; Notable for the following components:   CRP 1.0 (*)    All other components within normal limits  MAGNESIUM  SEDIMENTATION RATE  TROPONIN I (HIGH  SENSITIVITY)      EKG   EKG Interpretation  Date/Time:  Saturday November 26 2021 15:53:57 EDT Ventricular Rate:  78 PR Interval:  228 QRS Duration: 128 QT Interval:  387 QTC Calculation: 441 R Axis:   24 Text Interpretation: Sinus rhythm Prolonged PR interval Right bundle branch block When compared with ECG of EARLIER SAME DATE No significant change was found Confirmed by Delora Fuel ([id number]) on 11/27/2021 5:40:34 AM         Imaging Studies ordered: I ordered imaging studies including MRI L-spine, x-ray I independently visualized and interpreted imaging. I agree with the radiologist interpretation   Medicines ordered and prescription drug management: Meds ordered this encounter  Medications   DISCONTD: ketorolac (TORADOL) 15 MG/ML injection 15 mg   ketorolac (TORADOL) 15 MG/ML injection 15 mg   gadopiclenol (VUEWAY) 0.5 MMOL/ML solution 6 mL    -I have reviewed the patients home medicines and have made adjustments as needed  Critical interventions none  Consultations Obtained: I requested consultation with the neurosurgeon on-call Dr. Christella Noa,  and discussed lab and imaging findings as well as pertinent plan - they recommend: Medical admission with LSO bracing but no surgical intervention   Cardiac Monitoring: The patient was maintained on a cardiac monitor.  I personally viewed and interpreted the cardiac monitored which showed an underlying rhythm of: NSR  Social Determinants of Health:  Factors impacting patients care include: none   Reevaluation: After the interventions noted above, I reevaluated the patient and found that they have :improved  Co morbidities that complicate the patient evaluation  Past Medical History:  Diagnosis Date   Anemia    Coronary artery disease    Diabetes mellitus    DJD (degenerative joint disease)    Dysrhythmia    Edema    GERD (gastroesophageal reflux disease)    Hiatal hernia    History of blood transfusion     Hyperlipidemia    Hypertension    Hypothyroidism    IBS (irritable bowel syndrome)    SVT (supraventricular tachycardia) ([place])    with ablation      Dispostion: I considered admission for this patient, and due to multiple compression fractures 1 of which is acute, she will require hospital admission     Final Clinical Impression(s) / ED Diagnoses Final diagnoses:  None     @PCDICTATION @    Teressa Lower, MD 11/27/21 1128

## 2021-11-26 NOTE — ED Notes (Signed)
Sort NT informed this RN that the patient began c/o CP while waiting in the lobby. Pt brought into triage room, EKG obtained.

## 2021-11-26 NOTE — ED Provider Triage Note (Signed)
Emergency Medicine Provider Triage Evaluation Note  [name] , a 88 y.o. female  was evaluated in triage.  Pt complains of back pain which is her initial reason for coming to the emergency room.  While patient was out in the waiting room she developed chest pain.  Chest pain during my evaluation has resolved.  She has history of CAD and is status post CABG which was 23 years ago.  Denies shortness of breath, diaphoresis.  Will obtain cardiac work-up.  We will get lumbar x-ray.  Review of Systems  Positive: As above Negative: As above  Physical Exam  BP (!) 155/87   Pulse 88   Temp 97.9 F (36.6 C) (Oral)   Resp 17   SpO2 100%  Gen:   Awake, no distress   Resp:  Normal effort  MSK:   Moves extremities without difficulty  Other:    Medical Decision Making  Medically screening exam initiated at 2:59 PM.  Appropriate orders placed.  Porfirio Oar was informed that the remainder of the evaluation will be completed by another provider, this initial triage assessment does not replace that evaluation, and the importance of remaining in the ED until their evaluation is complete.     Evlyn Courier, PA-C 11/26/21 1500

## 2021-11-26 NOTE — ED Notes (Signed)
Pt returns from MRI ° °

## 2021-11-26 NOTE — Assessment & Plan Note (Signed)
Mild with sodium 130.  Hold Lasix and encourage oral hydration.

## 2021-11-26 NOTE — Assessment & Plan Note (Signed)
Stable, denies any chest pain.  Continue aspirin, rosuvastatin, Zetia.

## 2021-11-26 NOTE — Hospital Course (Signed)
[name] is a 88 y.o. female with medical history significant for CAD s/p CABG 2000, HTN, HLD, hypothyroidism, chronic back pain with severe stenosis L4-L5 and bilateral lumbar radiculopathy who is admitted with acute on chronic lower back pain with ambulatory dysfunction due to acute L5 compression fracture and subacute T12, L3, L4 compression fractures.

## 2021-11-26 NOTE — H&P (Signed)
History and Physical    [name] YQM:[id number] DOB: 1932-12-20 DOA: 11/26/2021  PCP: Donnajean Lopes, MD  Patient coming from: Home  I have personally briefly reviewed patient's old medical records in [place]  Chief Complaint: Worsening back pain  HPI: [name] is a 88 y.o. female with medical history significant for CAD s/p CABG 2000, HTN, HLD, hypothyroidism, chronic back pain with severe stenosis L4-L5 and bilateral lumbar radiculopathy who presented to the ED for evaluation of acute on chronic lower back pain.  Patient reports chronic low back pain with radiculopathy.  She normally ambulates with the use of a rolling walker.  Over the last week she has had worsening lower back pain.  She has had difficulty ambulating with her walker.  Today she was unable to stand up even with bracing or compression herself up due to significant mid low back pain.  She recently completed a course of prednisone which was prescribed by her orthopedic team for management of her lumbar radiculopathy.  She also underwent lumbar epidural steroid injection on 9/18.  She has not had any subjective fevers, chills, diaphoresis, chest pain, dyspnea, abdominal pain.  She was unable to ambulate in the ED and required wheelchair assistance to use the restroom.  ED Course  Labs/Imaging on admission: I have personally reviewed following labs and imaging studies.  Initial vitals showed BP 142/68, pulse 79, RR 16, temp 97.9 F, SPO2 99% on room air.  Labs show WBC 22.8, hemoglobin 14.6, platelets 221,000, sodium 130, potassium 4.4, bicarb 24, BUN 16, creatinine 0.86, serum glucose 160, magnesium 1.9, ESR 7, CRP 1.0, troponin 13.  2 view chest x-ray negative for focal consolidation, edema, effusion.  Lumbar spine x-ray negative for acute fracture or acute finding.  Chronic compression fractures and degenerative changes noted.  MRI lumbar spine with and without contrast showed vertebral  compression fractures from T11-L5.  L5 appears acute.  T12, L3, L4 compression fractures likely subacute.  Greatest degree of vertebral body height loss is present at L2 (60%) and L5 (70%).  L4-L5 multifactorial severe bilateral subarticular and central canal stenosis with apparent cauda equina nerve root impingement seen, similar to prior L-spine MRI of 04/30/2019.  Patient was given IV Toradol 15 mg.  EDP discussed with on-call neurosurgery, Dr. Christella Noa, who felt there was no surgical indication.  Due to uncontrolled pain and diminished mobility with increased fall risk the hospitalist service was consulted to admit for further evaluation and management.  Review of Systems: All systems reviewed and are negative except as documented in history of present illness above.   Past Medical History:  Diagnosis Date   Anemia    Coronary artery disease    Diabetes mellitus    DJD (degenerative joint disease)    Dysrhythmia    Edema    GERD (gastroesophageal reflux disease)    Hiatal hernia    History of blood transfusion    Hyperlipidemia    Hypertension    Hypothyroidism    IBS (irritable bowel syndrome)    SVT (supraventricular tachycardia) ([place])    with ablation    Past Surgical History:  Procedure Laterality Date   APPENDECTOMY     BREAST SURGERY     biopsy per left breast    CARDIAC CATHETERIZATION     CHOLECYSTECTOMY  2005   CORONARY ARTERY BYPASS GRAFT  2000   EYE SURGERY     cataract surgery bilat    REPLACEMENT TOTAL KNEE  2009  left    Social History:  reports that she has never smoked. She has never used smokeless tobacco. She reports that she does not drink alcohol and does not use drugs.  Allergies  Allergen Reactions   Actos [Pioglitazone Hydrochloride] Swelling    Edema.   Codeine     Visual hallucinations.    Lipitor [Atorvastatin] Other (See Comments)    Very achy    Family History  Problem Relation Age of Onset   Heart attack Mother 28       died    Heart attack Father 31       died   Breast cancer Sister    Diabetes Sister        and brother   Heart disease Other        sister, father and brother   Coronary artery disease Brother        PCI     Prior to Admission medications   Medication Sig Start Date End Date Taking? Authorizing Provider  acetaminophen (TYLENOL) 325 MG tablet Take 2 tablets (650 mg total) by mouth every 6 (six) hours as needed for up to 30 doses for mild pain or moderate pain. 04/30/19   Wyvonnia Dusky, MD  amLODipine (NORVASC) 10 MG tablet TAKE 1 TABLET BY MOUTH EVERY DAY 09/21/21   Fay Records, MD  aspirin 81 MG tablet Take 81 mg by mouth daily.    [provider]  calcium carbonate (TUMS - DOSED IN MG ELEMENTAL CALCIUM) 500 MG chewable tablet Chew 1 tablet by mouth as needed for indigestion or heartburn.    [provider]  ergocalciferol (VITAMIN D2) 50000 UNITS capsule Take 50,000 Units by mouth once a week.    [provider]  esomeprazole (NEXIUM) 20 MG capsule Take 20 mg by mouth daily before breakfast.    [provider]  ezetimibe (ZETIA) 10 MG tablet TAKE 1/2 TABLET BY MOUTH EVERY DAY 10/06/21   Fay Records, MD  furosemide (LASIX) 20 MG tablet TAKE 1 TABLET BY MOUTH EVERY DAY 01/17/21   Fay Records, MD  KLOR-CON M20 20 MEQ tablet TAKE 1 TABLET BY MOUTH EVERY DAY 01/17/21   Fay Records, MD  levothyroxine (SYNTHROID, LEVOTHROID) 75 MCG tablet Take 75 mcg by mouth daily.    [provider]  lidocaine (LIDODERM) 5 % PLACE 1 PATCH ONTO THE SKIN DAILY. REMOVE & DISCARD PATCH WITHIN 12 HOURS OR AS DIRECTED BY MD 10/27/19   Newt Minion, MD  lisinopril (ZESTRIL) 10 MG tablet TAKE 1 TABLET BY MOUTH EVERY DAY 04/19/21   Fay Records, MD  methocarbamol (ROBAXIN) 500 MG tablet Take 1 tablet (500 mg total) by mouth 3 (three) times daily. 07/23/19   Suzan Slick, NP  metoprolol succinate (TOPROL-XL) 25 MG 24 hr tablet TAKE 1 TABLET BY MOUTH EVERY DAY 01/17/21    Fay Records, MD  nitroGLYCERIN (NITROSTAT) 0.4 MG SL tablet Place 1 tablet (0.4 mg total) under the tongue every 5 (five) minutes x 3 doses as needed for chest pain. 12/07/15   Strader, Fransisco Hertz, PA-C  ONETOUCH VERIO test strip CHECK BLOOD SUGARS ONCE DAILY 05/06/19   [provider]  predniSONE (DELTASONE) 10 MG tablet Take 1 tablet (10 mg total) by mouth daily with breakfast. 10/26/21   Suzan Slick, NP  rosuvastatin (CRESTOR) 10 MG tablet TAKE 1 TABLET BY MOUTH EVERY DAY 01/17/21   Fay Records, MD  traMADol Veatrice Bourbon) 50  MG tablet Take 1 tablet (50 mg total) by mouth every 6 (six) hours as needed. 06/25/19   Suzan Slick, NP    Physical Exam: Vitals:   11/26/21 1420 11/26/21 1545 11/26/21 1630 11/26/21 1957  BP: (!) 155/87 (!) 157/65 (!) 160/78 (!) 154/79  Pulse: 88 80 81 77  Resp: 17 18 16 14   Temp:  98 F (36.7 C)  97.8 F (36.6 C)  TempSrc:    Oral  SpO2: 100% 100% 100% 98%   Constitutional: Supine in bed, NAD, calm, comfortable Eyes: EOMI, lids and conjunctivae normal ENMT: Mucous membranes are moist. Posterior pharynx clear of any exudate or lesions.Normal dentition.  Neck: normal, supple, no masses. Respiratory: clear to auscultation bilaterally, no wheezing, no crackles. Normal respiratory effort. No accessory muscle use.  Cardiovascular: Regular rate and rhythm, no murmurs / rubs / gallops. No extremity edema. 2+ pedal pulses. Abdomen: no tenderness, no masses palpated. Musculoskeletal: no clubbing / cyanosis. No joint deformity upper and lower extremities. Good ROM, no contractures. Normal muscle tone.  Skin: no rashes, lesions, ulcers. No induration Neurologic: CN 2-12 grossly intact. Sensation intact. Strength 5/5 in all 4 while laying supine in bed.  Gait not assessed. Psychiatric: Alert and oriented x 3. Normal mood.   EKG: Personally reviewed. Sinus rhythm, first-degree AV block, RBBB, no acute ischemic changes.  Similar to  prior.  Assessment/Plan Principal Problem:   Non-traumatic compression fractures of vertebral column, initial encounter ([place]) Active Problems:   Leukocytosis   Hyponatremia   Hyperlipidemia, mixed   HYPERTENSION, BENIGN   CAD S/P CABG x 3   Spinal stenosis at L4-L5 level   Hypothyroidism   [name] is a 88 y.o. female with medical history significant for CAD s/p CABG 2000, HTN, HLD, hypothyroidism, chronic back pain with severe stenosis L4-L5 and bilateral lumbar radiculopathy who is admitted with acute on chronic lower back pain with ambulatory dysfunction due to acute L5 compression fracture and subacute T12, L3, L4 compression fractures.  Assessment and Plan: * Non-traumatic compression fractures of vertebral column, initial encounter ([place]) Severe L4-L5 spinal stenosis with chronic lumbar radiculopathy Presenting with acute on chronic low back pain due to multilevel vertebral compression fractures T11-L5.  L5 fracture is acute with 70% vertebral body height loss.  T12, L3, and L4 compression fractures are subacute.  Normally ambulates with rolling walker, unable to do so now due to acute compression fracture. -EDP spoke with neurosurgery, no role for surgical intervention -PT/OT eval -Tylenol, tramadol prn, Lidoderm patch for analgesia -Limit systemic steroids going forward  Leukocytosis WBC 22.8 on admit.  Likely in setting of recent steroid use.  ESR 7, CRP 1.0.  No sign of active infection.  Continue to monitor.  Hyponatremia Mild with sodium 130.  Hold Lasix and encourage oral hydration.  Hypothyroidism Continue Synthroid.  CAD S/P CABG x 3 Stable, denies any chest pain.  Continue aspirin, rosuvastatin, Zetia.  HYPERTENSION, BENIGN Continue amlodipine, Toprol-XL, lisinopril.  Holding Lasix.  Hyperlipidemia, mixed Continue rosuvastatin and Zetia.  DVT prophylaxis: enoxaparin (LOVENOX) injection 40 mg Start: 11/26/21 2000 Code Status: DNR, confirmed with patient  on admission Family Communication: Discussed with patient, she has discussed with family Disposition Plan: From home, dispo pending clinical progress Consults called: EDP discussed with on-call neurosurgery Severity of Illness: The appropriate patient status for this patient is OBSERVATION. Observation status is judged to be reasonable and necessary in order to provide the required intensity of service to ensure the patient's safety. The patient's  presenting symptoms, physical exam findings, and initial radiographic and laboratory data in the context of their medical condition is felt to place them at decreased risk for further clinical deterioration. Furthermore, it is anticipated that the patient will be medically stable for discharge from the hospital within 2 midnights of admission.   Zada Finders MD [facility]  If 7PM-7AM, please contact night-coverage [email or web address]  11/26/2021, 8:18 PM

## 2021-11-26 NOTE — Assessment & Plan Note (Signed)
Continue rosuvastatin and Zetia. 

## 2021-11-26 NOTE — ED Triage Notes (Signed)
Patient arrived by [facility] with ongoing chronic back pain due to disc disease that has worsened the past week. Also reports pain in hips

## 2021-11-26 NOTE — Assessment & Plan Note (Signed)
Continue Synthroid °

## 2021-11-26 NOTE — Assessment & Plan Note (Signed)
WBC 22.8 on admit.  Likely in setting of recent steroid use.  ESR 7, CRP 1.0.  No sign of active infection.  Continue to monitor.

## 2021-11-27 DIAGNOSIS — M4850XA Collapsed vertebra, not elsewhere classified, site unspecified, initial encounter for fracture: Secondary | ICD-10-CM | POA: Diagnosis not present

## 2021-11-27 LAB — BASIC METABOLIC PANEL
Anion gap: 12 (ref 5–15)
BUN: 16 mg/dL (ref 8–23)
CO2: 26 mmol/L (ref 22–32)
Calcium: 9.6 mg/dL (ref 8.9–10.3)
Chloride: 96 mmol/L — ABNORMAL LOW (ref 98–111)
Creatinine, Ser: 0.88 mg/dL (ref 0.44–1.00)
GFR, Estimated: >60 mL/min (ref >60)
Glucose, Bld: 92 mg/dL (ref 70–99)
Potassium: 4.5 mmol/L (ref 3.5–5.1)
Sodium: 134 mmol/L — ABNORMAL LOW (ref 135–145)

## 2021-11-27 LAB — CBC
HCT: 35.6 % — ABNORMAL LOW (ref 36.0–46.0)
Hemoglobin: 12.5 g/dL (ref 12.0–15.0)
MCH: 32.8 pg (ref 26.0–34.0)
MCHC: 35.1 g/dL (ref 30.0–36.0)
MCV: 93.4 fL (ref 80.0–100.0)
Platelets: 191 10*3/uL (ref 150–400)
RBC: 3.81 MIL/uL — ABNORMAL LOW (ref 3.87–5.11)
RDW: 13.2 % (ref 11.5–15.5)
WBC: 11.6 10*3/uL — ABNORMAL HIGH (ref 4.0–10.5)
nRBC: 0 % (ref 0.0–0.2)

## 2021-11-27 LAB — VITAMIN D 25 HYDROXY (VIT D DEFICIENCY, FRACTURES): Vit D, 25-Hydroxy: 81.96 ng/mL (ref 30–100)

## 2021-11-27 MED ORDER — ACETAMINOPHEN 500 MG PO TABS
1000.0000 mg | ORAL_TABLET | Freq: Three times a day (TID) | ORAL | Status: DC
  Administered 2021-11-27 – 2021-11-29 (×7): 1000 mg via ORAL
  Filled 2021-11-27 (×7): qty 2

## 2021-11-27 NOTE — Progress Notes (Addendum)
  [facility] Progress Note  Patient: [name]     MLY:[id number]  DOA: 11/26/2021   PCP: Donnajean Lopes, MD       Brief hospital course: 88 y/o female with CAD s/p CABG in 2000, hypothyroidism, HTN, chronic back pain who receives epidural injections who presents with acute back pain over the past week in her lower back. No recent injury noted.  She receives epidural injection and states they help for her leg pain.  MRI> Acute L5 compression fracture and subacute T12, L3, L4 compression fracture  Subjective:  Has pain in lower mid back and is having increased pain with moving.  Assessment and Plan: Principal Problem:   Non-traumatic acute and subacute compression fractures of vertebral column, initial encounter, chronic vertebral compression fractures   Spinal stenosis at L4-L5 level - she states she take extra strength Tylenol TID- have ordered this - tramadol ordered PRN but she does not like to take this - have asked NS for an opinion - per PT, she will need SNF - she is in agreement with this  Active Problems:   Leukocytosis - improved    Hyponatremia - cont to hold Lasix - sodium 130> 134   HTN   CAD S/P CABG x 3 - Aspirin, Toprol, Amlodipine, Lisinopril  Hypothyroidism - Synthroid     Code Status: DNR DVT prophylaxis:  lovenox  Consultants: NS Level of Care: Level of care: Med-Surg  Objective:   Vitals:   11/27/21 0915 11/27/21 0920 11/27/21 0930 11/27/21 1000  BP: 121/71  (!) 150/68 126/63  Pulse: 78  84 74  Resp: 17  19 13   Temp:  98.1 F (36.7 C)    TempSrc:  Oral    SpO2: 100%  100% 97%   There were no vitals filed for this visit. Exam: General exam: Appears comfortable  HEENT: oral mucosa moist Respiratory system: Clear to auscultation.  Cardiovascular system: S1 & S2 heard  Gastrointestinal system: Abdomen soft, non-tender, nondistended. Normal bowel sounds   Extremities: No cyanosis, clubbing or edema Back: point tenderness  in spine in lower lumbar area Psychiatry:  Mood & affect appropriate.    Imaging and lab data was personally reviewed    CBC: Recent Labs  Lab 11/26/21 1420 11/27/21 0445  WBC 22.8* 11.6*  NEUTROABS 19.7*  --   HGB 14.6 12.5  HCT 40.8 35.6*  MCV 92.3 93.4  PLT 221 812   Basic Metabolic Panel: Recent Labs  Lab 11/26/21 1420 11/27/21 0445  NA 130* 134*  K 4.4 4.5  CL 94* 96*  CO2 24 26  GLUCOSE 160* 92  BUN 16 16  CREATININE 0.86 0.88  CALCIUM 9.6 9.6  MG 1.9  --    GFR: CrCl cannot be calculated (Unknown ideal weight.).  Scheduled Meds:  acetaminophen  1,000 mg Oral TID   amLODipine  10 mg Oral Daily   aspirin EC  81 mg Oral Daily   enoxaparin (LOVENOX) injection  40 mg Subcutaneous Q24H   ezetimibe  5 mg Oral Daily   levothyroxine  75 mcg Oral Daily   lidocaine  1 patch Transdermal QHS   lisinopril  10 mg Oral Daily   metoprolol succinate  25 mg Oral Daily   pantoprazole  40 mg Oral QAC breakfast   rosuvastatin  10 mg Oral Daily   Continuous Infusions:   LOS: 0 days   Author: Debbe Odea  11/27/2021 11:27 AM

## 2021-11-27 NOTE — Evaluation (Signed)
Physical Therapy Evaluation Patient Details Name: [name] MRN: [id number] DOB: 01-28-1933 Today's Date: 11/27/2021  History of Present Illness  Patient is a 88 yo female presenting to the ED with significant back pain on 11/26/21. MRI of spine finding vertebral compression fractures from T11-L5.  L5 appears acute.  T12, L3, L4 compression fractures likely subacute. PMH includes:  CAD s/p CABG 2000, HTN, HLD, hypothyroidism, chronic back pain  Clinical Impression  Pt admitted secondary to problem above with deficits below. Pt requiring mod A for bed mobility and min A +2 for transfers and gait. Increased back pain throughout. Pt currently lives alone and feel she will have difficulty performing mobility tasks at home. Recommending ST SNF at d/c to address mobility deficits. Will continue to follow acutely.        Recommendations for follow up therapy are one component of a multi-disciplinary discharge planning process, led by the attending physician.  Recommendations may be updated based on patient status, additional functional criteria and insurance authorization.  Follow Up Recommendations Skilled nursing-short term rehab (<3 hours/day) Can patient physically be transported by private vehicle: Yes    Assistance Recommended at Discharge Frequent or constant Supervision/Assistance  Patient can return home with the following  A little help with walking and/or transfers;A little help with bathing/dressing/bathroom;Assistance with cooking/housework;Help with stairs or ramp for entrance;Assist for transportation    Equipment Recommendations None recommended by PT  Recommendations for Other Services       Functional Status Assessment Patient has had a recent decline in their functional status and demonstrates the ability to make significant improvements in function in a reasonable and predictable amount of time.     Precautions / Restrictions Precautions Precautions: Back Precaution  Booklet Issued: No Precaution Comments: Reviewed back precautions for comfort Restrictions Weight Bearing Restrictions: No      Mobility  Bed Mobility Overal bed mobility: Needs Assistance Bed Mobility: Sidelying to Sit, Sit to Sidelying   Sidelying to sit: Mod assist     Sit to sidelying: Mod assist General bed mobility comments: increased time and cues for log roll technique for pain management. significant pain noted with movement though improved when patient would adhere to back precautions    Transfers Overall transfer level: Needs assistance Equipment used: 2 person hand held assist Transfers: Sit to/from Stand Sit to Stand: Min assist, +2 physical assistance, +2 safety/equipment           General transfer comment: min A of 2 to come into standing (HHA because no walker present)    Ambulation/Gait Ambulation/Gait assistance: Min assist, +2 physical assistance Gait Distance (Feet): 50 Feet Assistive device: 2 person hand held assist Gait Pattern/deviations: Step-through pattern, Decreased stride length Gait velocity: Decreased     General Gait Details: Min A for steadying. Pt holding tight to PT/OT hands for support. Further mobility limited secondary to pain and fatigue.  Stairs            Wheelchair Mobility    Modified Rankin (Stroke Patients Only)       Balance Overall balance assessment: Needs assistance Sitting-balance support: Bilateral upper extremity supported, Feet supported Sitting balance-Leahy Scale: Fair     Standing balance support: Bilateral upper extremity supported, During functional activity Standing balance-Leahy Scale: Poor Standing balance comment: reliant on external support of PT/OT                             Pertinent Vitals/Pain Pain  Assessment Pain Assessment: Faces Faces Pain Scale: Hurts whole lot Pain Location: back Pain Descriptors / Indicators: Discomfort, Grimacing, Guarding Pain  Intervention(s): Limited activity within patient's tolerance, Monitored during session, Repositioned    Home Living Family/patient expects to be discharged to:: Private residence Living Arrangements: Alone Available Help at Discharge: Family;Available PRN/intermittently Type of Home: Apartment (senior apartment) Home Access: Stairs to enter Entrance Stairs-Rails: None Entrance Stairs-Number of Steps: 1   Home Layout: One level Home Equipment: BSC/3in1;Rollator (4 wheels);Cane - single point      Prior Function Prior Level of Function : Independent/Modified Independent             Mobility Comments: Uses rollator for mobility. ADLs Comments: Has been occasionally completing sponge bath when pain is too severe to attempt shower transfer     Hand Dominance        Extremity/Trunk Assessment   Upper Extremity Assessment Upper Extremity Assessment: Defer to OT evaluation    Lower Extremity Assessment Lower Extremity Assessment: Generalized weakness    Cervical / Trunk Assessment Cervical / Trunk Assessment: Other exceptions Cervical / Trunk Exceptions: multilevel compression fx  Communication   Communication: No difficulties  Cognition Arousal/Alertness: Awake/alert Behavior During Therapy: WFL for tasks assessed/performed Overall Cognitive Status: Within Functional Limits for tasks assessed                                          General Comments General comments (skin integrity, edema, etc.): VSS throughout    Exercises     Assessment/Plan    PT Assessment Patient needs continued PT services  PT Problem List Decreased strength;Decreased activity tolerance;Decreased balance;Decreased mobility;Decreased knowledge of precautions;Pain       PT Treatment Interventions DME instruction;Gait training;Functional mobility training;Therapeutic activities;Therapeutic exercise;Balance training;Patient/family education    PT Goals (Current goals can  be found in the Care Plan section)  Acute Rehab PT Goals Patient Stated Goal: to decrease back pain PT Goal Formulation: With patient Time For Goal Achievement: 12/11/21 Potential to Achieve Goals: Fair    Frequency Min 2X/week     Co-evaluation PT/OT/SLP Co-Evaluation/Treatment: Yes Reason for Co-Treatment: For patient/therapist safety;To address functional/ADL transfers PT goals addressed during session: Mobility/safety with mobility;Balance OT goals addressed during session: ADL's and self-care;Proper use of Adaptive equipment and DME       AM-PAC PT "6 Clicks" Mobility  Outcome Measure Help needed turning from your back to your side while in a flat bed without using bedrails?: A Lot Help needed moving from lying on your back to sitting on the side of a flat bed without using bedrails?: A Lot Help needed moving to and from a bed to a chair (including a wheelchair)?: A Little Help needed standing up from a chair using your arms (e.g., wheelchair or bedside chair)?: A Little Help needed to walk in hospital room?: A Little Help needed climbing 3-5 steps with a railing? : A Lot 6 Click Score: 15    End of Session Equipment Utilized During Treatment: Gait belt Activity Tolerance: Patient limited by pain Patient left: in bed;with call bell/phone within reach (on stretcher in ED) Nurse Communication: Mobility status PT Visit Diagnosis: Other abnormalities of gait and mobility (R26.89);Difficulty in walking, not elsewhere classified (R26.2);Pain Pain - part of body:  (back)    Time: 0822-0839 PT Time Calculation (min) (ACUTE ONLY): 17 min   Charges:   PT Evaluation $PT  Eval Moderate Complexity: 1 Mod          Farley Ly, PT, DPT  Acute Rehabilitation Services  Office: [phone]   Le[facility] Prom 11/27/2021, 1:52 PM

## 2021-11-27 NOTE — Evaluation (Signed)
Occupational Therapy Evaluation Patient Details Name: [name] MRN: [id number] DOB: Apr 04, 1932 Today's Date: 11/27/2021   History of Present Illness Patient is a 88 yo female presenting to the ED with significant back pain on 11/26/21. MRI of spine finding vertebral compression fractures from T11-L5.  L5 appears acute.  T12, L3, L4 compression fractures likely subacute. PMH includes:  CAD s/p CABG 2000, HTN, HLD, hypothyroidism, chronic back pain   Clinical Impression   Prior to this admission, patient living independently in senior living apartment, where sister could check on her. Patient endorses she does not drive. Currently, patient is presenting with significant pain in back, decreased activity tolerance, and generalized weakness. Patient is min A for ADLs, transfers, and ambulation. Patient lives alone, and sister cannot provide 24/7 assist therefore OT recommending SNF placement due to generalized weakness, need for increased assist for all aspects of care, and cannot currently tolerate intensive rehab measures. OT will continue to follow acutely.      Recommendations for follow up therapy are one component of a multi-disciplinary discharge planning process, led by the attending physician.  Recommendations may be updated based on patient status, additional functional criteria and insurance authorization.   Follow Up Recommendations  Skilled nursing-short term rehab (<3 hours/day)    Assistance Recommended at Discharge Set up Supervision/Assistance  Patient can return home with the following A little help with walking and/or transfers;A lot of help with bathing/dressing/bathroom;Assistance with cooking/housework;Assist for transportation;Help with stairs or ramp for entrance    Functional Status Assessment  Patient has had a recent decline in their functional status and demonstrates the ability to make significant improvements in function in a reasonable and predictable amount of  time.  Equipment Recommendations  Other (comment) (Defer to next venue of care)    Recommendations for Other Services       Precautions / Restrictions Precautions Precautions: Back Precaution Comments: Reviewed back precautions for comfort Restrictions Weight Bearing Restrictions: No      Mobility Bed Mobility Overal bed mobility: Needs Assistance Bed Mobility: Sidelying to Sit, Sit to Sidelying   Sidelying to sit: Mod assist     Sit to sidelying: Mod assist General bed mobility comments: increased time and cues need to complete back precautions for comfort, significant pain noted with movement though improved when patient would adhere to back precautions    Transfers Overall transfer level: Needs assistance Equipment used: 2 person hand held assist Transfers: Sit to/from Stand Sit to Stand: Min assist, +2 physical assistance, +2 safety/equipment           General transfer comment: min A of 2 to come into standing (HHA because no walker present) patient able to ambulate small household distance prior to fatigue      Balance Overall balance assessment: Needs assistance Sitting-balance support: Bilateral upper extremity supported, Feet supported Sitting balance-Leahy Scale: Fair     Standing balance support: Reliant on assistive device for balance, Bilateral upper extremity supported, During functional activity Standing balance-Leahy Scale: Poor Standing balance comment: reliant on external support of PT/OT                           ADL either performed or assessed with clinical judgement   ADL Overall ADL's : Needs assistance/impaired Eating/Feeding: Set up;Sitting   Grooming: Set up;Sitting   Upper Body Bathing: Minimal assistance;Sitting   Lower Body Bathing: Moderate assistance;Maximal assistance;Sitting/lateral leans;Sit to/from stand   Upper Body Dressing : Minimal assistance;Sitting  Lower Body Dressing: Moderate assistance;Maximal  assistance;Sitting/lateral leans;Sit to/from stand   Toilet Transfer: Minimal assistance;Ambulation;Rolling walker (2 wheels) Toilet Transfer Details (indicate cue type and reason): simulated with ambulation Toileting- Clothing Manipulation and Hygiene: Minimal assistance;Sitting/lateral lean;Sit to/from stand       Functional mobility during ADLs: Minimal assistance;Rolling walker (2 wheels);Cueing for sequencing;Cueing for safety General ADL Comments: Patient presenting with significant pain in back, decreased activity tolerance, and generalized weakness     Vision Baseline Vision/History: 0 No visual deficits Ability to See in Adequate Light: 0 Adequate Patient Visual Report: No change from baseline       Perception     Praxis      Pertinent Vitals/Pain Pain Assessment Pain Assessment: Faces Faces Pain Scale: Hurts whole lot Pain Location: back Pain Descriptors / Indicators: Discomfort, Grimacing, Guarding Pain Intervention(s): Limited activity within patient's tolerance, Monitored during session, Repositioned     Hand Dominance     Extremity/Trunk Assessment Upper Extremity Assessment Upper Extremity Assessment: Generalized weakness   Lower Extremity Assessment Lower Extremity Assessment: Defer to PT evaluation   Cervical / Trunk Assessment Cervical / Trunk Assessment: Normal   Communication Communication Communication: No difficulties   Cognition Arousal/Alertness: Awake/alert Behavior During Therapy: WFL for tasks assessed/performed Overall Cognitive Status: Within Functional Limits for tasks assessed                                       General Comments  VSS throughout    Exercises     Shoulder Instructions      Home Living Family/patient expects to be discharged to:: Private residence Living Arrangements: Alone Available Help at Discharge: Family;Available PRN/intermittently Type of Home: Apartment (senior apartment) Home  Access: Stairs to enter Secretary/administrator of Steps: 1 Entrance Stairs-Rails: None Home Layout: One level     Bathroom Shower/Tub: Chief Strategy Officer: Standard     Home Equipment: BSC/3in1;Rollator (4 wheels);Cane - single point          Prior Functioning/Environment Prior Level of Function : Independent/Modified Independent             Mobility Comments: Uses rollator for mobility. ADLs Comments: Has been occasionally completing sponge bath when pain is too severe to attempt shower transfer        OT Problem List: Decreased strength;Decreased activity tolerance;Impaired balance (sitting and/or standing);Decreased coordination;Decreased knowledge of use of DME or AE;Decreased knowledge of precautions;Pain      OT Treatment/Interventions: Self-care/ADL training;Therapeutic exercise;Energy conservation;DME and/or AE instruction;Therapeutic activities;Patient/family education;Balance training;Manual therapy    OT Goals(Current goals can be found in the care plan section) Acute Rehab OT Goals Patient Stated Goal: to be in less pain OT Goal Formulation: With patient Time For Goal Achievement: 12/11/21 Potential to Achieve Goals: Fair  OT Frequency: Min 2X/week    Co-evaluation PT/OT/SLP Co-Evaluation/Treatment: Yes Reason for Co-Treatment: For patient/therapist safety;To address functional/ADL transfers   OT goals addressed during session: ADL's and self-care;Proper use of Adaptive equipment and DME      AM-PAC OT "6 Clicks" Daily Activity     Outcome Measure Help from another person eating meals?: A Little Help from another person taking care of personal grooming?: A Little Help from another person toileting, which includes using toliet, bedpan, or urinal?: A Little Help from another person bathing (including washing, rinsing, drying)?: A Lot Help from another person to put on and taking off regular upper  body clothing?: A Little Help from  another person to put on and taking off regular lower body clothing?: A Lot 6 Click Score: 16   End of Session Equipment Utilized During Treatment: Gait belt Nurse Communication: Mobility status  Activity Tolerance: Patient limited by pain Patient left: with call bell/phone within reach;in bed  OT Visit Diagnosis: Other abnormalities of gait and mobility (R26.89);Unsteadiness on feet (R26.81);Muscle weakness (generalized) (M62.81);Pain Pain - part of body:  (Back)                Time: 3716-9678 OT Time Calculation (min): 17 min Charges:  OT General Charges $OT Visit: 1 Visit OT Evaluation $OT Eval Moderate Complexity: 1 Mod  Corinne Ports E. Lynell Kussman, OTR/L Acute Rehabilitation Services [phone]   Ascencion Dike 11/27/2021, 12:34 PM

## 2021-11-28 ENCOUNTER — Ambulatory Visit: Payer: Medicare Other | Admitting: Orthopedic Surgery

## 2021-11-28 DIAGNOSIS — M4850XA Collapsed vertebra, not elsewhere classified, site unspecified, initial encounter for fracture: Secondary | ICD-10-CM | POA: Diagnosis not present

## 2021-11-28 MED ORDER — MAGNESIUM HYDROXIDE 400 MG/5ML PO SUSP
30.0000 mL | Freq: Two times a day (BID) | ORAL | Status: DC | PRN
  Administered 2021-11-28: 30 mL via ORAL
  Filled 2021-11-28 (×2): qty 30

## 2021-11-28 NOTE — NC FL2 (Signed)
[place] MEDICAID FL2 LEVEL OF CARE SCREENING TOOL     IDENTIFICATION  Patient Name: [name] Birthdate: [birth year] Sex: female Admission Date (Current Location): 11/26/2021  [facility] [place]   Facility and Address:  [facility], [address], [place], [place] [address]      Provider Number: [id number]  Attending Physician Name and Address:  Calvert Cantorizwan, Saima, MD  Relative Name and Phone Number:  Fredirick MaudlinMartha Perkin, sister - [phone]    Current Level of Care: Hospital Recommended Level of Care: Skilled Nursing Facility Prior Approval Number:    Date Approved/Denied:   PASRR Number: [id number]  Discharge Plan: SNF    Current Diagnoses: Patient Active Problem List   Diagnosis Date Noted   Non-traumatic compression fractures of vertebral column, initial encounter (HCC) 11/26/2021   Leukocytosis 11/26/2021   Hyponatremia 11/26/2021   Spinal stenosis at L4-L5 level 11/26/2021   Hypothyroidism    Chest pain 11/28/2018   Chest pain on breathing 12/06/2015   Exertional chest pain    COUGH 06/24/2009   HYPOPOTASSEMIA 12/31/2008   Pure hypercholesterolemia 12/16/2008   Hyperlipidemia, mixed 08/28/2008   HYPERTENSION, BENIGN 08/28/2008   CAD S/P CABG x 3 08/28/2008   GERD 10/16/2007    Orientation RESPIRATION BLADDER Height & Weight     Self, Time, Situation, Place  Normal Continent Weight:   Height:     BEHAVIORAL SYMPTOMS/MOOD NEUROLOGICAL BOWEL NUTRITION STATUS      Continent Diet  AMBULATORY STATUS COMMUNICATION OF NEEDS Skin   Limited Assist Verbally Normal                       Personal Care Assistance Level of Assistance  Bathing, Feeding, Dressing Bathing Assistance: Limited assistance Feeding assistance: Limited assistance Dressing Assistance: Maximum assistance     Functional Limitations Info  Sight, Hearing, Speech Sight Info: Adequate Hearing Info: Adequate Speech Info: Adequate     SPECIAL CARE FACTORS FREQUENCY  PT (By licensed PT), OT (By licensed OT)     PT Frequency: 3-5 x per week OT Frequency: 3-5 x per week            Contractures Contractures Info: Not present    Additional Factors Info  Code Status, Allergies Code Status Info: DNR Allergies Info: Actos, Codeine, Lipitor, Metoclopramide           Current Medications (11/28/2021):  This is the current hospital active medication list Current Facility-Administered Medications  Medication Dose Route Frequency Provider Last Rate Last Admin   acetaminophen (TYLENOL) tablet 1,000 mg  1,000 mg Oral TID Calvert Cantorizwan, Saima, MD   1,000 mg at 11/28/21 0823   amLODipine (NORVASC) tablet 10 mg  10 mg Oral Daily Darreld McleanPatel, Vishal R, MD   10 mg at 11/28/21 [id number]   aspirin EC tablet 81 mg  81 mg Oral Daily Darreld McleanPatel, Vishal R, MD   81 mg at 11/28/21 0823   enoxaparin (LOVENOX) injection 40 mg  40 mg Subcutaneous Q24H Darreld McleanPatel, Vishal R, MD   40 mg at 11/27/21 2156   ezetimibe (ZETIA) tablet 5 mg  5 mg Oral Daily Darreld McleanPatel, Vishal R, MD   5 mg at 11/28/21 [id number]   levothyroxine (SYNTHROID) tablet 75 mcg  75 mcg Oral Daily Charlsie QuestPatel, Vishal R, MD   75 mcg at 11/28/21 0654   lidocaine (LIDODERM) 5 % 1 patch  1 patch Transdermal QHS Charlsie QuestPatel, Vishal R, MD   1 patch at 11/27/21 2156  lisinopril (ZESTRIL) tablet 10 mg  10 mg Oral Daily Zada Finders R, MD   10 mg at 11/28/21 0824   metoprolol succinate (TOPROL-XL) 24 hr tablet 25 mg  25 mg Oral Daily Zada Finders R, MD   25 mg at 11/28/21 0823   ondansetron (ZOFRAN) tablet 4 mg  4 mg Oral Q6H PRN Lenore Cordia, MD       Or   ondansetron (ZOFRAN) injection 4 mg  4 mg Intravenous Q6H PRN Lenore Cordia, MD       pantoprazole (PROTONIX) EC tablet 40 mg  40 mg Oral QAC breakfast Zada Finders R, MD   40 mg at 11/28/21 0823   rosuvastatin (CRESTOR) tablet 10 mg  10 mg Oral Daily Lenore Cordia, MD   10 mg at 11/28/21 1610   senna-docusate (Senokot-S) tablet 1 tablet  1 tablet Oral QHS PRN  Lenore Cordia, MD       traMADol Veatrice Bourbon) tablet 50 mg  50 mg Oral Q12H PRN Lenore Cordia, MD   50 mg at 11/27/21 2156     Discharge Medications: Please see discharge summary for a list of discharge medications.  Relevant Imaging Results:  Relevant Lab Results:   Additional Information SS# [id number]  Curlene Labrum, RN

## 2021-11-28 NOTE — Care Management Obs Status (Cosign Needed)
[place] NOTIFICATION   Patient Details  Name: [name] MRN: [id number] Date of Birth: 11/18/1932   Medicare Observation Status Notification Given:  Yes    Curlene Labrum, RN 11/28/2021, 9:29 AM

## 2021-11-28 NOTE — TOC Initial Note (Addendum)
Transition of Care [facility]) - Initial/Assessment Note    Patient Details  Name: [name] MRN: [id number] Date of Birth: 1932/12/14  Transition of [facility]) CM/SW Contact:    Curlene Labrum, RN Phone Number: 11/28/2021, 9:57 AM  Clinical Narrative:                 CM met with the patient at the bedside to discuss transitions of care needs and the patient is agreeable to SNF placement.  Will start SNF work up and follow up with the patient regarding bed offers and start of insurance authorization.  The patient states that she would like to remain in the [place] area if possible.  The patient lives alone and was admitted for non-traumatic compression fractures of vertebral column.   The patient has elderly sister that lives in [place], otherwise the patient lives alone in an apartment without family assistance for ADLs.  Medicare observation letter was provided to the patient.  DME at this time in the home include RW, cane and raised toilet seat.    The patient states that her sister is able to provide transportation assistance to the patient for appointments outside of the hospital.  CM will continue to follow the patient for SNF placement - pending bed offer availability and start of insurance authorization.  11/28/2021 1137 - CM met with the patient at the bedside and offered Medicare choice regarding SNF placement and the patient chose [facility].  I spoke with Dr. Wynelle Cleveland and she is currently waiting on Neurosurgery to evaluate patient and patient will be likely able to discharge tomorrow, 11/29/2021 - Insurance authorization was started.  CM will continue to follow the patient for pending insurance authorization and approval for placement at [facility]- if approved - may discharge to the facility tomorrow, 11/29/2021 by PTAR/ ambulance transport.  Expected Discharge Plan: Skilled Nursing Facility Barriers to Discharge: Continued Medical Work  up   Patient Goals and CMS Choice Patient states their goals for this hospitalization and ongoing recovery are:: To go to rehab and get better CMS Medicare.gov Compare Post Acute Care list provided to:: Patient Choice offered to / list presented to : Patient  Expected Discharge Plan and Services Expected Discharge Plan: [place]   Discharge Planning Services: CM Consult Post Acute Care Choice: [place] Living arrangements for the past 2 months: Apartment                                      Prior Living Arrangements/Services Living arrangements for the past 2 months: Apartment Lives with:: Self Patient language and need for interpreter reviewed:: Yes Do you feel safe going back to the place where you live?: Yes      Need for Family Participation in Patient Care: Yes (Comment) Care giver support system in place?: Yes (comment) Current home services: DME (DME at home including cane, RW, raised toilet seat) Criminal Activity/Legal Involvement Pertinent to Current Situation/Hospitalization: No - Comment as needed  Activities of Daily Living Home Assistive Devices/Equipment: Walker (specify type), Eyeglasses, Dentures (specify type) ADL Screening (condition at time of admission) Patient's cognitive ability adequate to safely complete daily activities?: Yes Is the patient deaf or have difficulty hearing?: No Does the patient have difficulty seeing, even when wearing glasses/contacts?: No Does the patient have difficulty concentrating, remembering, or making decisions?: No Patient able to express need for assistance  with ADLs?: Yes Does the patient have difficulty dressing or bathing?: No Independently performs ADLs?: Yes (appropriate for developmental age) Does the patient have difficulty walking or climbing stairs?: Yes Weakness of Legs: Both Weakness of Arms/Hands: None  Permission Sought/Granted Permission sought to share information  with : Case Manager, Family Supports, Customer service manager Permission granted to share information with : Yes, Verbal Permission Granted     Permission granted to share info w AGENCY: SNF facility  Permission granted to share info w Relationship: Yuli Lanigan, sister - [phone]     Emotional Assessment Appearance:: Appears stated age Attitude/Demeanor/Rapport: Gracious Affect (typically observed): Accepting Orientation: : Oriented to Self, Oriented to Place, Oriented to  Time, Oriented to Situation Alcohol / Substance Use: Not Applicable Psych Involvement: No (comment)  Admission diagnosis:  Non-traumatic compression fracture of vertebral column, initial encounter ([place]) [M48.50XA] Acute midline low back pain without sciatica [M54.50] Patient Active Problem List   Diagnosis Date Noted   Non-traumatic compression fractures of vertebral column, initial encounter ([place]) 11/26/2021   Leukocytosis 11/26/2021   Hyponatremia 11/26/2021   Spinal stenosis at L4-L5 level 11/26/2021   Hypothyroidism    Chest pain 11/28/2018   Chest pain on breathing 12/06/2015   Exertional chest pain    COUGH 06/24/2009   HYPOPOTASSEMIA 12/31/2008   Pure hypercholesterolemia 12/16/2008   Hyperlipidemia, mixed 08/28/2008   HYPERTENSION, BENIGN 08/28/2008   CAD S/P CABG x 3 08/28/2008   GERD 10/16/2007   PCP:  Donnajean Lopes, MD Pharmacy:   [id number] [place], [id number]: [phone]: [phone]    Social Determinants of Health (SDOH) Interventions    Readmission Risk Interventions     No data to display

## 2021-11-28 NOTE — Progress Notes (Signed)
[facility] Progress Note  Patient: [name]     ANV:[id number]  DOA: 11/26/2021   PCP: Donnajean Lopes, MD       Brief hospital course: 88 y/o female with CAD s/p CABG in 2000, hypothyroidism, HTN, chronic back pain who receives epidural injections who presents with acute back pain over the past week in her lower back. No recent injury noted.  She receives epidural injection and states they help for her leg pain.  MRI> Acute L5 compression fracture and subacute T12, L3, L4 compression fracture  Subjective:  No new complaints. Continues to have mid lower back pain. Tramadol makes her feel lightheaded.  Assessment and Plan: Principal Problem:   Non-traumatic acute and subacute compression fractures of vertebral column, initial encounter, chronic vertebral compression fractures   Spinal stenosis at L4-L5 level - she states she take extra strength Tylenol TID- have ordered this - tramadol ordered PRN but she does not like to take this and it is making her feel lightheaded - have asked NS for an opinion - per PT, she will need SNF - she is in agreement with this  Active Problems:   Leukocytosis - improved    Hyponatremia - cont to hold Lasix - sodium 130> 134   HTN   CAD S/P CABG x 3 - Aspirin, Toprol, Amlodipine, Lisinopril  Hypothyroidism - Synthroid     Code Status: DNR DVT prophylaxis:  lovenox  Consultants: NS Level of Care: Level of care: Med-Surg  Objective:   Vitals:   11/28/21 0037 11/28/21 0401 11/28/21 0739 11/28/21 1145  BP: (!) 132/59 134/75 (!) 160/79 (!) 142/66  Pulse: 64 67 73 67  Resp: 18 17 18 18   Temp: 98 F (36.7 C) 98 F (36.7 C) 97.6 F (36.4 C) (!) 97.4 F (36.3 C)  TempSrc: Oral Oral Oral Oral  SpO2: 96% 98% 97% 97%   There were no vitals filed for this visit. Exam: General exam: Appears comfortable  HEENT: oral mucosa moist Respiratory system: Clear to auscultation.  Cardiovascular system: S1 & S2 heard   Gastrointestinal system: Abdomen soft, non-tender, nondistended. Normal bowel sounds   Extremities: No cyanosis, clubbing or edema Psychiatry:  Mood & affect appropriate.    Imaging and lab data was personally reviewed    CBC: Recent Labs  Lab 11/26/21 1420 11/27/21 0445  WBC 22.8* 11.6*  NEUTROABS 19.7*  --   HGB 14.6 12.5  HCT 40.8 35.6*  MCV 92.3 93.4  PLT 221 599    Basic Metabolic Panel: Recent Labs  Lab 11/26/21 1420 11/27/21 0445  NA 130* 134*  K 4.4 4.5  CL 94* 96*  CO2 24 26  GLUCOSE 160* 92  BUN 16 16  CREATININE 0.86 0.88  CALCIUM 9.6 9.6  MG 1.9  --     GFR: CrCl cannot be calculated (Unknown ideal weight.).  Scheduled Meds:  acetaminophen  1,000 mg Oral TID   amLODipine  10 mg Oral Daily   aspirin EC  81 mg Oral Daily   enoxaparin (LOVENOX) injection  40 mg Subcutaneous Q24H   ezetimibe  5 mg Oral Daily   levothyroxine  75 mcg Oral Daily   lidocaine  1 patch Transdermal QHS   lisinopril  10 mg Oral Daily   metoprolol succinate  25 mg Oral Daily   pantoprazole  40 mg Oral QAC breakfast   rosuvastatin  10 mg Oral Daily   Continuous Infusions:   LOS: 0 days   Author: Debbe Odea  11/28/2021 3:02 PM

## 2021-11-28 NOTE — Progress Notes (Signed)
Mobility Specialist Progress Note:   11/28/21 1007  Mobility  Activity Ambulated with assistance in hallway  Activity Response Tolerated well  Distance Ambulated (ft) 230 ft  $Mobility charge 1 Mobility  Level of Assistance Contact guard assist, steadying assist  [place] wheel walker  Mobility Referral Yes   Pt received in bed and eager. C/o soreness in hips and fatigue at end of session. Pt left in bed with all needs met and call bell in reach.   [name] Mobility Specialist-Acute Rehab Secure Chat only

## 2021-11-29 DIAGNOSIS — E871 Hypo-osmolality and hyponatremia: Secondary | ICD-10-CM | POA: Diagnosis not present

## 2021-11-29 DIAGNOSIS — I10 Essential (primary) hypertension: Secondary | ICD-10-CM | POA: Diagnosis not present

## 2021-11-29 DIAGNOSIS — E782 Mixed hyperlipidemia: Secondary | ICD-10-CM

## 2021-11-29 DIAGNOSIS — M4850XA Collapsed vertebra, not elsewhere classified, site unspecified, initial encounter for fracture: Secondary | ICD-10-CM | POA: Diagnosis not present

## 2021-11-29 LAB — BASIC METABOLIC PANEL
Anion gap: 9 (ref 5–15)
BUN: 13 mg/dL (ref 8–23)
CO2: 25 mmol/L (ref 22–32)
Calcium: 9.1 mg/dL (ref 8.9–10.3)
Chloride: 99 mmol/L (ref 98–111)
Creatinine, Ser: 0.75 mg/dL (ref 0.44–1.00)
GFR, Estimated: >60 mL/min (ref >60)
Glucose, Bld: 121 mg/dL — ABNORMAL HIGH (ref 70–99)
Potassium: 3.9 mmol/L (ref 3.5–5.1)
Sodium: 133 mmol/L — ABNORMAL LOW (ref 135–145)

## 2021-11-29 LAB — CBC
HCT: 38.2 % (ref 36.0–46.0)
Hemoglobin: 13.3 g/dL (ref 12.0–15.0)
MCH: 32.4 pg (ref 26.0–34.0)
MCHC: 34.8 g/dL (ref 30.0–36.0)
MCV: 93.2 fL (ref 80.0–100.0)
Platelets: 184 10*3/uL (ref 150–400)
RBC: 4.1 MIL/uL (ref 3.87–5.11)
RDW: 13.1 % (ref 11.5–15.5)
WBC: 11.9 10*3/uL — ABNORMAL HIGH (ref 4.0–10.5)
nRBC: 0 % (ref 0.0–0.2)

## 2021-11-29 MED ORDER — TRAMADOL HCL 50 MG PO TABS: 50.0000 mg | ORAL_TABLET | Freq: Four times a day (QID) | ORAL | 0 refills | Status: DC | PRN

## 2021-11-29 MED ORDER — GAVISCON 80-14.2 MG PO CHEW: 1.0000 | CHEWABLE_TABLET | Freq: Three times a day (TID) | ORAL | Status: DC | PRN

## 2021-11-29 MED ORDER — MAGNESIUM HYDROXIDE 400 MG/5ML PO SUSP: 30.0000 mL | Freq: Two times a day (BID) | ORAL | 0 refills | Status: AC | PRN

## 2021-11-29 MED ORDER — SENNOSIDES-DOCUSATE SODIUM 8.6-50 MG PO TABS: 1.0000 | ORAL_TABLET | Freq: Every evening | ORAL | Status: DC | PRN

## 2021-11-29 MED ORDER — ACETAMINOPHEN 500 MG PO TABS: 1000.0000 mg | ORAL_TABLET | Freq: Three times a day (TID) | ORAL | 0 refills | Status: AC

## 2021-11-29 NOTE — Progress Notes (Deleted)
Physician Discharge Summary  [name] [id number] DOB: 20-Oct-1932 DOA: 11/26/2021  PCP: Donnajean Lopes, MD  Admit date: 11/26/2021 Discharge date: 11/29/2021 Discharging to: NSF Recommendations for Outpatient Follow-up:  Lasix 20 mg daily on hold- weigh daily  Consults:  NS Procedures:  none   Discharge Diagnoses:   Principal Problem:   Non-traumatic compression fractures of vertebral column, initial encounter ([place]) Active Problems:   Leukocytosis   Hyponatremia   Hyperlipidemia, mixed   HYPERTENSION, BENIGN   CAD S/P CABG x 3   Spinal stenosis at L4-L5 level   Hypothyroidism     Hospital Course:  88 y/o female with CAD s/p CABG in 2000, hypothyroidism, HTN, chronic back pain who receives epidural injections who presents with acute back pain over the past week in her lower back. No recent injury noted.  She receives epidural injection and states they help for her leg pain.   MRI> Acute L5 compression fracture and subacute T12, L3, L4 compression fracture  Principal Problem:   Non-traumatic acute and subacute compression fractures of vertebral column, initial encounter, chronic vertebral compression fractures   Spinal stenosis at L4-L5 level - she states she take extra strength Tylenol TID- have ordered this - tramadol ordered PRN but she does not like to take this and it is making her feel lightheaded - LSO brace ordered by NS - per PT, she will need SNF - she is in agreement with this   Active Problems:   Leukocytosis - improved     Hyponatremia - cont to hold Lasix - sodium 130> 134    HTN   CAD S/P CABG x 3 - Aspirin, Toprol, Amlodipine, Lisinopril   Hypothyroidism - Synthroid    There is no height or weight on file to calculate BMI. Nutrition Status:          Discharge Instructions  Discharge Instructions     Diet - low sodium heart healthy   Complete by: As directed    Increase activity slowly   Complete by: As directed        Allergies as of 11/29/2021       Reactions   Actos [pioglitazone Hydrochloride] Swelling   Edema.   Codeine    Visual hallucinations.    Lipitor [atorvastatin] Other (See Comments)   Very achy   Metoclopramide    Other reaction(s): sleepy with shakes        Medication List     STOP taking these medications    furosemide 20 MG tablet Commonly known as: LASIX   Klor-Con M20 20 MEQ tablet Generic drug: potassium chloride SA   predniSONE 10 MG tablet Commonly known as: DELTASONE       TAKE these medications    acetaminophen 325 MG tablet Commonly known as: Tylenol Take 2 tablets (650 mg total) by mouth every 6 (six) hours as needed for up to 30 doses for mild pain or moderate pain. What changed: when to take this   acetaminophen 500 MG tablet Commonly known as: TYLENOL Take 2 tablets (1,000 mg total) by mouth 3 (three) times daily. What changed: You were already taking a medication with the same name, and this prescription was added. Make sure you understand how and when to take each.   amLODipine 10 MG tablet Commonly known as: NORVASC TAKE 1 TABLET BY MOUTH EVERY DAY   aspirin 81 MG tablet Take 81 mg by mouth daily.   calcium carbonate 500 MG chewable tablet Commonly known as: TUMS -  dosed in mg elemental calcium Chew 1 tablet by mouth as needed for indigestion or heartburn.   ergocalciferol 1.25 MG (50000 UT) capsule Commonly known as: VITAMIN D2 Take 50,000 Units by mouth once a week.  Saturday   esomeprazole 20 MG capsule Commonly known as: NEXIUM Take 20 mg by mouth daily before breakfast.   ezetimibe 10 MG tablet Commonly known as: ZETIA TAKE 1/2 TABLET BY MOUTH EVERY DAY   Gaviscon 80-14.2 MG Chew Generic drug: Alum Hydroxide-Mag Trisilicate Chew 1 tablet by mouth 3 (three) times daily between meals as needed.   levothyroxine 75 MCG tablet Commonly known as: SYNTHROID Take 75 mcg by mouth daily.   lidocaine 5 % Commonly known as:  LIDODERM PLACE 1 PATCH ONTO THE SKIN DAILY. REMOVE & DISCARD PATCH WITHIN 12 HOURS OR AS DIRECTED BY MD   lisinopril 10 MG tablet Commonly known as: ZESTRIL TAKE 1 TABLET BY MOUTH EVERY DAY   magnesium hydroxide 400 MG/5ML suspension Commonly known as: MILK OF MAGNESIA Take 30 mLs by mouth 2 (two) times daily as needed for mild constipation.   metoprolol succinate 25 MG 24 hr tablet Commonly known as: TOPROL-XL TAKE 1 TABLET BY MOUTH EVERY DAY   nitroGLYCERIN 0.4 MG SL tablet Commonly known as: NITROSTAT Place 1 tablet (0.4 mg total) under the tongue every 5 (five) minutes x 3 doses as needed for chest pain.   OneTouch Verio test strip Generic drug: glucose blood CHECK BLOOD SUGARS ONCE DAILY   rosuvastatin 10 MG tablet Commonly known as: CRESTOR TAKE 1 TABLET BY MOUTH EVERY DAY   senna-docusate 8.6-50 MG tablet Commonly known as: Senokot-S Take 1 tablet by mouth at bedtime as needed for mild constipation.   traMADol 50 MG tablet Commonly known as: ULTRAM Take 1 tablet (50 mg total) by mouth every 6 (six) hours as needed.        Contact information for after-discharge care     Destination     [facility] .   Service: Skilled Nursing Contact information: [place] [phone]                        The results of significant diagnostics from this hospitalization (including imaging, microbiology, ancillary and laboratory) are listed below for reference.    MR Lumbar Spine W Wo Contrast  Result Date: 11/26/2021 CLINICAL DATA:  Provided history: Low back pain, cauda equina syndrome suspected. EXAM: MRI LUMBAR SPINE WITHOUT AND WITH CONTRAST TECHNIQUE: Multiplanar and multiecho pulse sequences of the lumbar spine were obtained without and with intravenous contrast. CONTRAST:  6 mL view a intravenous contrast COMPARISON:  Spine radiographs 05/26/2021. Lumbar spine MRI 04/30/2019. FINDINGS:  Segmentation: 5 lumbar vertebrae. The caudal most well-formed intervertebral disc space is designated L5-S1. Alignment: Mild multilevel bony retropulsion. 6 mm L4-L5 grade 1 anterolisthesis. Vertebrae: Vertebral compression fractures at T11, T12 L1, L2, L3, L4 and L5. The greatest degree of vertebral body height loss is present at L2 (60%) and L5 (70%). There is prominent edema and enhancement within the L5 vertebral body, and this fracture is likely acute. Edema and enhancement extend into the posterior elements (left greater than right) at this level. Less prominent edema and enhancement are present within the L3, L4 and T12 vertebrae. Fractures at these levels are favored subacute. Conus medullaris and cauda equina: Conus extends to the L2 level. No signal abnormality identified within the visualized distal spinal cord. Paraspinal and other  soft tissues: No acute abnormality identified within included portions of the abdomen/retroperitoneum. No paraspinal mass or collection. Disc levels: Multilevel disc degeneration, greatest at L3-L4 (advanced at this level). T12-L1: Mild bony retropulsion at the level of the L1 superior endplate with associated disc bulge. Facet arthrosis. Mild effacement of ventral thecal sac (without spinal cord mass effect). No significant foraminal stenosis. L1-L2: Disc bulge. Facet arthrosis and ligamentum flavum hypertrophy. Mild bilateral subarticular and central canal narrowing (without frank nerve root impingement). No significant foraminal stenosis. L2-L3: Disc bulge. Facet arthrosis and ligamentum flavum hypertrophy. No significant spinal canal stenosis. Mild right neural foraminal narrowing. L3-L4: Disc bulge with endplate osteophytes. Facet arthrosis and ligamentum flavum hypertrophy. Moderate right subarticular narrowing with crowding of the descending right L4 nerve root. Mild left subarticular narrowing (without appreciable nerve root impingement at this site). Moderate central  canal stenosis. Mild bilateral neural foraminal narrowing. L4-L5: 6 mm grade 1 anterolisthesis. Disc uncovering with disc bulge and superimposed central disc protrusion. Advanced facet arthrosis with ligamentum flavum hypertrophy. Severe bilateral subarticular and central canal stenosis with cauda equina nerve root impingement. Bilateral neural foraminal narrowing (mild to moderate right, moderate to severe left). L5-S1: Disc bulge with endplate spurring facet arthrosis (advanced right, moderate left) with ligamentum flavum hypertrophy. No significant spinal canal stenosis. Bilateral neural foraminal narrowing (mild right, moderate left). These results were called by telephone at the time of interpretation on 11/26/2021 at 6:19 pm to provider [facility] , who verbally acknowledged these results. IMPRESSION: Vertebral compression fractures at T11, T12, L1, L2, L3, L4 and L5. The L5 vertebral compression fracture is likely acute. The T12, L3 and L4 compression fractures are likely subacute. The greatest degree of vertebral body height loss is present at L2 (60%) and L5 (70%). Lumbar spondylosis, as outlined. Most notably at L4-L5, there is multifactorial severe bilateral subarticular and central canal stenosis with apparent cauda equina nerve root impingement. Spinal canal stenosis at this level is similar to the prior lumbar spine MRI of 04/30/2019. Bilateral neural foraminal narrowing (mild to moderate right, moderate to severe left). Electronically Signed   By: Kellie Simmering D.O.   On: 11/26/2021 18:20   DG Chest 2 View  Result Date: 11/26/2021 CLINICAL DATA:  Low back pain.  Worsening weakness. EXAM: CHEST - 2 VIEW COMPARISON:  07/15/2019. FINDINGS: Stable changes from previous CABG surgery. Cardiac silhouette mildly enlarged. No mediastinal or hilar masses. No evidence of adenopathy. Clear lungs.  No pleural effusion or pneumothorax. Skeletal structures are demineralized. Multiple vertebral compression  fractures T3, T7 and T11. IMPRESSION: No acute cardiopulmonary disease. Electronically Signed   By: Lajean Manes M.D.   On: 11/26/2021 15:52   DG Lumbar Spine Complete  Result Date: 11/26/2021 CLINICAL DATA:  Low back pain.  Worsening weakness. EXAM: LUMBAR SPINE - COMPLETE 4+ VIEW COMPARISON:  07/12/2021. FINDINGS: Multiple compression fractures, L1, L2 and L5, unchanged from the prior exam. Grade 1 anterolisthesis of L4 on L5, also stable. No other spondylolisthesis. Moderate to marked loss of disc height at L3-L4. Mild loss of disc height at T12-L1. Mild facet degenerative changes at L4-L5 and L5-S1. Skeletal structures are diffusely demineralized. Aortic atherosclerotic calcifications. Right upper quadrant vascular clips. Soft tissues otherwise unremarkable. IMPRESSION: 1. No acute fracture or acute finding. 2. Chronic compression fractures and degenerative changes as detailed, without change from the prior radiographs. Electronically Signed   By: Lajean Manes M.D.   On: 11/26/2021 15:49   XR C-ARM NO REPORT  Result Date: 11/14/2021 Please  see Notes tab for imaging impression.  Epidural Steroid injection  Result Date: 11/14/2021 Magnus Sinning, MD     11/23/2021  5:47 AM Lumbar Epidural Steroid Injection - Interlaminar Approach with Fluoroscopic Guidance Patient: [name]     Date of Birth: 1932/11/09 MRN: [id number] PCP: Donnajean Lopes, MD     Visit Date: 11/14/2021  Universal Protocol:   Consent Given By: the patient Position: PRONE Additional Comments: Vital signs were monitored before and after the procedure. Patient was prepped and draped in the usual sterile fashion. The correct patient, procedure, and site was verified. Injection Procedure Details: Procedure diagnoses: Lumbar radiculopathy [M54.16] Meds Administered: Meds ordered this encounter Medications  methylPREDNISolone acetate (DEPO-MEDROL) injection 80 mg  Laterality: Midline Location/Site:  L5-S1 Needle: 3.5 in., 20 ga.  Tuohy Needle Placement: Paramedian epidural Findings:  -Comments: Excellent flow of contrast into the epidural space. Procedure Details: Using a paramedian approach from the side mentioned above, the region overlying the inferior lamina was localized under fluoroscopic visualization and the soft tissues overlying this structure were infiltrated with 4 ml. of 1% Lidocaine without Epinephrine. The Tuohy needle was inserted into the epidural space using a paramedian approach. The epidural space was localized using loss of resistance along with counter oblique bi-planar fluoroscopic views.  After negative aspirate for air, blood, and CSF, a 2 ml. volume of Isovue-250 was injected into the epidural space and the flow of contrast was observed. Radiographs were obtained for documentation purposes.  The injectate was administered into the level noted above. Additional Comments: The patient tolerated the procedure well Dressing: 2 x 2 sterile gauze and Band-Aid  Post-procedure details: Patient was observed during the procedure. Post-procedure instructions were reviewed. Patient left the clinic in stable condition.  Labs:   Basic Metabolic Panel: Recent Labs  Lab 11/26/21 1420 11/27/21 0445 11/29/21 0837  NA 130* 134* 133*  K 4.4 4.5 3.9  CL 94* 96* 99  CO2 24 26 25   GLUCOSE 160* 92 121*  BUN 16 16 13   CREATININE 0.86 0.88 0.75  CALCIUM 9.6 9.6 9.1  MG 1.9  --   --      CBC: Recent Labs  Lab 11/26/21 1420 11/27/21 0445 11/29/21 0837  WBC 22.8* 11.6* 11.9*  NEUTROABS 19.7*  --   --   HGB 14.6 12.5 13.3  HCT 40.8 35.6* 38.2  MCV 92.3 93.4 93.2  PLT 221 191 184         SIGNED:   Debbe Odea, MD  [facility] 11/29/2021, 11:48 AM

## 2021-11-29 NOTE — Progress Notes (Signed)
Orthopedic Tech Progress Note Patient Details:  [name] 11-29-1932 [id number]  Ortho Devices Type of Ortho Device: Lumbar corsett Ortho Device/Splint Location: BACK Ortho Device/Splint Interventions: Ordered, Application, Adjustment   Post Interventions Patient Tolerated: Well Instructions Provided: Care of device  Janit Pagan 11/29/2021, 1:10 PM

## 2021-11-29 NOTE — TOC Progression Note (Addendum)
Transition of Care [facility]) - Progression Note    Patient Details  Name: [name] MRN: [id number] Date of Birth: 11-04-1932  Transition of Care [facility]) CM/SW [place], RN Phone Number: 11/29/2021, 9:34 AM  Clinical Narrative:    CM called and left a voicemail message with [facility] SNF facility to check on bed availability.  The patient was approved for Insurance authorization for placement through [employer] portal Auth ID# [id number].  CM will continue to follow the patient for SNF placement today, 11/29/2021.  The patient was seen by Dr. Cyndy Freeze, neurosurgery MD this morning and the patient will need a brace ordered prior to her discharge to the facility today.  I will speak with bedside nursing once orders placed so that the patient can wear to the nursing facility for rehab services.  I called [facility] Nursing department and the charge RN will follow up with Dr. Christella Noa, Neurosurgery MD, place orders and call ortho tech for back brace to be placed prior to patient's discharge to the facility.  Bedside nursing - Please call [facility] to give report prior to discharge to the facility at [phone] - Room 601 p.  Once the ortho-tech provides the patient with the specialty back brace this afternoon -   The patient received her back brace ordered by Christella Noa, MD with Neurosurgery and patient can be discharged to the nursing home facility.    [facility] was called and transportation was coordinated.  [facility] to arrive in the next hour for transportation to the facility.     Expected Discharge Plan: [place] Barriers to Discharge: Continued Medical Work up  Expected Discharge Plan and Services Expected Discharge Plan: [place]   Discharge Planning Services: CM Consult Post Acute Care Choice: [place] Living arrangements for the past 2 months: Apartment                                       Social  Determinants of Health (SDOH) Interventions    Readmission Risk Interventions     No data to display

## 2021-11-29 NOTE — Progress Notes (Signed)
Physical Therapy Treatment Patient Details Name: [name] MRN: [id number] DOB: 1932/05/04 Today's Date: 11/29/2021   History of Present Illness Patient is a 88 yo female presenting to the ED with significant back pain on 11/26/21. MRI of spine finding vertebral compression fractures from T11-L5. L5 appears acute. T12, L3, L4 compression fractures likely subacute. PMH includes:  CAD s/p CABG 2000, HTN, HLD, hypothyroidism, chronic back pain.    PT Comments    Pt received in supine, agreeable to therapy session with emphasis on back precaution instruction, donning/doffing of lumbar corset brace, safety with transfers and use of RW with gait instruction. Pt with moderate to severe back pain with mobility, VSS on RA. MinA for gait in room using RW with frequent reminders for activity pacing and precautions. Pt needed up to modA with increased time and multimodal cues for bed mobility using bed rails. Pt with poor recall of precautions at beginning of session and with delayed recall technique so handout issued to reinforce back precautions. Pt receptive to instruction on donning/doffing of lumbar corset for OOB mobility but will need reinforcement. Pt continues to benefit from PT services to progress toward functional mobility goals.   Recommendations for follow up therapy are one component of a multi-disciplinary discharge planning process, led by the attending physician.  Recommendations may be updated based on patient status, additional functional criteria and insurance authorization.  Follow Up Recommendations  Skilled nursing-short term rehab (<3 hours/day) Can patient physically be transported by private vehicle: Yes   Assistance Recommended at Discharge Frequent or constant Supervision/Assistance  Patient can return home with the following A little help with walking and/or transfers;A little help with bathing/dressing/bathroom;Assistance with cooking/housework;Help with stairs or ramp for  entrance;Assist for transportation   Equipment Recommendations  None recommended by PT    Recommendations for Other Services       Precautions / Restrictions Precautions Precautions: Back Precaution Booklet Issued: Yes (comment) Precaution Comments: reviewed 3/3 back precs, pt with no recall Required Braces or Orthoses: Spinal Brace Spinal Brace: Lumbar corset;Applied in sitting position ("brace when OOB" per Dr Sueanne Margarita note 10/3) Restrictions Weight Bearing Restrictions: No     Mobility  Bed Mobility Overal bed mobility: Needs Assistance Bed Mobility: Sidelying to Sit, Sit to Sidelying, Rolling Rolling: Min assist Sidelying to sit: Mod assist     Sit to sidelying: Mod assist General bed mobility comments: increased time and cues for log roll technique for pain management. Pain improved when pt following cues correctly for log rolling, pt with increased pain returning to supine from seated.    Transfers Overall transfer level: Needs assistance Equipment used: Rolling walker (2 wheels) Transfers: Sit to/from Stand Sit to Stand: Min assist  General transfer comment: from EOB and BSC heights to RW, cues for safe UE placement and proper body mechanics with fair carryover    Ambulation/Gait Ambulation/Gait assistance: Min assist Gait Distance (Feet): 70 Feet (24ft, seated break, 50ft x2 to/from bathroom) Assistive device: Rolling walker (2 wheels) Gait Pattern/deviations: Step-through pattern, Decreased stride length, Trunk flexed Gait velocity: Decreased     General Gait Details: Min A for steadying, especially with turns to prevent pt from twisting her back. Pt holding tight to RW for support. Pt reporting severe pain post-mobility, VSS on RA      Balance Overall balance assessment: Needs assistance Sitting-balance support: Bilateral upper extremity supported, Feet supported Sitting balance-Leahy Scale: Fair     Standing balance support: Bilateral upper  extremity supported, During functional activity Standing  balance-Leahy Scale: Poor Standing balance comment: reliant on external support of RW             Cognition Arousal/Alertness: Awake/alert Behavior During Therapy: WFL for tasks assessed/performed Overall Cognitive Status: Within Functional Limits for tasks assessed          General Comments: poor recall of back precautions, even directly after reviewing them, pt only able to recall 2/3 precs. Handout given (highlighted) to reinforce, placed with DC paperwork after session.        Exercises      General Comments General comments (skin integrity, edema, etc.): SpO2/HR [facility]      Pertinent Vitals/Pain Pain Assessment Pain Assessment: 0-10 Pain Score: 9  Faces Pain Scale: Hurts whole lot Pain Location: back with sitting/standing Pain Descriptors / Indicators: Discomfort, Grimacing, Guarding Pain Intervention(s): Limited activity within patient's tolerance, Monitored during session, Repositioned, Patient requesting pain meds-RN notified (pt defers ice pack)     PT Goals (current goals can now be found in the care plan section) Acute Rehab PT Goals Patient Stated Goal: to decrease back pain PT Goal Formulation: With patient Time For Goal Achievement: 12/11/21 Progress towards PT goals: Progressing toward goals    Frequency    Min 2X/week      PT Plan Current plan remains appropriate       AM-PAC PT "6 Clicks" Mobility   Outcome Measure  Help needed turning from your back to your side while in a flat bed without using bedrails?: A Lot Help needed moving from lying on your back to sitting on the side of a flat bed without using bedrails?: A Lot Help needed moving to and from a bed to a chair (including a wheelchair)?: A Little Help needed standing up from a chair using your arms (e.g., wheelchair or bedside chair)?: A Little Help needed to walk in hospital room?: A Little Help needed climbing 3-5 steps  with a railing? : A Lot 6 Click Score: 15    End of Session Equipment Utilized During Treatment: Gait belt;Back brace Activity Tolerance: Patient tolerated treatment well;Patient limited by pain Patient left: in bed;with call bell/phone within reach;with bed alarm set (chair posture in bed) Nurse Communication: Mobility status;Patient requests pain meds;Precautions PT Visit Diagnosis: Other abnormalities of gait and mobility (R26.89);Difficulty in walking, not elsewhere classified (R26.2);Pain Pain - part of body:  (back)     Time: 4627-0350 PT Time Calculation (min) (ACUTE ONLY): 31 min  Charges:  $Gait Training: 8-22 mins $Therapeutic Activity: 8-22 mins                     Mikyla Schachter P., PTA Acute Rehabilitation Services Secure Chat Preferred 9a-5:30pm Office: [place] 11/29/2021, 1:45 PM

## 2021-11-29 NOTE — Progress Notes (Signed)
Patient ID: [name], female   DOB: [birth year], 88 y.o.   MRN: [id number] BP (!) 149/72   Pulse 75   Temp 97.7 F (36.5 C) (Oral)   Resp 15   SpO2 98%  Patient admitted for compression fracture L5. Needs an LSO, no need for thoracic extension. Non operative candidate, no further treatment indicated. Will see in office after discharge. Brace when out of bed.

## 2021-11-29 NOTE — Progress Notes (Signed)
Mobility Specialist: Progress Note   11/29/21 1103  Mobility  Activity Ambulated with assistance in hallway  Activity Response Tolerated well  Distance Ambulated (ft) 180 ft  $Mobility charge 1 Mobility  Level of Assistance Minimal assist, patient does 75% or more  Assistive Device Front wheel walker   Pt received in the bed and agreeable to mobility. C/o back pain during ambulation, no rating given. Pt back to bed after session with call bell and phone in reach.   [facility] Meilah Delrosario Mobility Specialist Mobility Specialist [address]: [phone]

## 2021-11-29 NOTE — Discharge Summary (Signed)
Physician Discharge Summary  [name] [id number] DOB: [birth year] DOA: 11/26/2021  PCP: Donnajean Lopes, MD  Admit date: 11/26/2021 Discharge date: 11/29/2021 Discharging to: NSF Recommendations for Outpatient Follow-up:  Lasix 20 mg daily on hold- weigh daily  Consults:  NS Procedures:  none   Discharge Diagnoses:   Principal Problem:   Non-traumatic compression fractures of vertebral column, initial encounter ([place]) Active Problems:   Leukocytosis   Hyponatremia   Hyperlipidemia, mixed   HYPERTENSION, BENIGN   CAD S/P CABG x 3   Spinal stenosis at L4-L5 level   Hypothyroidism     Hospital Course:  88 y/o female with CAD s/p CABG in 2000, hypothyroidism, HTN, chronic back pain who receives epidural injections who presents with acute back pain over the past week in her lower back. No recent injury noted.  She receives epidural injection and states they help for her leg pain.   MRI> Acute L5 compression fracture and subacute T12, L3, L4 compression fracture  Principal Problem:   Non-traumatic acute and subacute compression fractures of vertebral column, initial encounter, chronic vertebral compression fractures   Spinal stenosis at L4-L5 level - she states she take extra strength Tylenol TID- have ordered this - tramadol ordered PRN but she does not like to take this and it is making her feel lightheaded - LSO brace ordered by NS - per PT, she will need SNF - she is in agreement with this   Active Problems:   Leukocytosis - improved     Hyponatremia - cont to hold Lasix - sodium 130> 134    HTN   CAD S/P CABG x 3 - Aspirin, Toprol, Amlodipine, Lisinopril   Hypothyroidism - Synthroid    There is no height or weight on file to calculate BMI. Nutrition Status:          Discharge Instructions  Discharge Instructions     Diet - low sodium heart healthy   Complete by: As directed    Increase activity slowly   Complete by: As directed        Allergies as of 11/29/2021       Reactions   Actos [pioglitazone Hydrochloride] Swelling   Edema.   Codeine    Visual hallucinations.    Lipitor [atorvastatin] Other (See Comments)   Very achy   Metoclopramide    Other reaction(s): sleepy with shakes        Medication List     STOP taking these medications    furosemide 20 MG tablet Commonly known as: LASIX   Klor-Con M20 20 MEQ tablet Generic drug: potassium chloride SA   predniSONE 10 MG tablet Commonly known as: DELTASONE       TAKE these medications    acetaminophen 325 MG tablet Commonly known as: Tylenol Take 2 tablets (650 mg total) by mouth every 6 (six) hours as needed for up to 30 doses for mild pain or moderate pain. What changed: when to take this   acetaminophen 500 MG tablet Commonly known as: TYLENOL Take 2 tablets (1,000 mg total) by mouth 3 (three) times daily. What changed: You were already taking a medication with the same name, and this prescription was added. Make sure you understand how and when to take each.   amLODipine 10 MG tablet Commonly known as: NORVASC TAKE 1 TABLET BY MOUTH EVERY DAY   aspirin 81 MG tablet Take 81 mg by mouth daily.   calcium carbonate 500 MG chewable tablet Commonly known as: TUMS -  dosed in mg elemental calcium Chew 1 tablet by mouth as needed for indigestion or heartburn.   ergocalciferol 1.25 MG (50000 UT) capsule Commonly known as: VITAMIN D2 Take 50,000 Units by mouth once a week.  Saturday   esomeprazole 20 MG capsule Commonly known as: NEXIUM Take 20 mg by mouth daily before breakfast.   ezetimibe 10 MG tablet Commonly known as: ZETIA TAKE 1/2 TABLET BY MOUTH EVERY DAY   Gaviscon 80-14.2 MG Chew Generic drug: Alum Hydroxide-Mag Trisilicate Chew 1 tablet by mouth 3 (three) times daily between meals as needed.   levothyroxine 75 MCG tablet Commonly known as: SYNTHROID Take 75 mcg by mouth daily.   lidocaine 5 % Commonly known as:  LIDODERM PLACE 1 PATCH ONTO THE SKIN DAILY. REMOVE & DISCARD PATCH WITHIN 12 HOURS OR AS DIRECTED BY MD   lisinopril 10 MG tablet Commonly known as: ZESTRIL TAKE 1 TABLET BY MOUTH EVERY DAY   magnesium hydroxide 400 MG/5ML suspension Commonly known as: MILK OF MAGNESIA Take 30 mLs by mouth 2 (two) times daily as needed for mild constipation.   metoprolol succinate 25 MG 24 hr tablet Commonly known as: TOPROL-XL TAKE 1 TABLET BY MOUTH EVERY DAY   nitroGLYCERIN 0.4 MG SL tablet Commonly known as: NITROSTAT Place 1 tablet (0.4 mg total) under the tongue every 5 (five) minutes x 3 doses as needed for chest pain.   OneTouch Verio test strip Generic drug: glucose blood CHECK BLOOD SUGARS ONCE DAILY   rosuvastatin 10 MG tablet Commonly known as: CRESTOR TAKE 1 TABLET BY MOUTH EVERY DAY   senna-docusate 8.6-50 MG tablet Commonly known as: Senokot-S Take 1 tablet by mouth at bedtime as needed for mild constipation.   traMADol 50 MG tablet Commonly known as: ULTRAM Take 1 tablet (50 mg total) by mouth every 6 (six) hours as needed.        Contact information for after-discharge care     Destination     [facility] .   Service: Skilled Nursing Contact information: [place] [phone]                        The results of significant diagnostics from this hospitalization (including imaging, microbiology, ancillary and laboratory) are listed below for reference.    MR Lumbar Spine W Wo Contrast  Result Date: 11/26/2021 CLINICAL DATA:  Provided history: Low back pain, cauda equina syndrome suspected. EXAM: MRI LUMBAR SPINE WITHOUT AND WITH CONTRAST TECHNIQUE: Multiplanar and multiecho pulse sequences of the lumbar spine were obtained without and with intravenous contrast. CONTRAST:  6 mL view a intravenous contrast COMPARISON:  Spine radiographs 05/26/2021. Lumbar spine MRI 04/30/2019. FINDINGS:  Segmentation: 5 lumbar vertebrae. The caudal most well-formed intervertebral disc space is designated L5-S1. Alignment: Mild multilevel bony retropulsion. 6 mm L4-L5 grade 1 anterolisthesis. Vertebrae: Vertebral compression fractures at T11, T12 L1, L2, L3, L4 and L5. The greatest degree of vertebral body height loss is present at L2 (60%) and L5 (70%). There is prominent edema and enhancement within the L5 vertebral body, and this fracture is likely acute. Edema and enhancement extend into the posterior elements (left greater than right) at this level. Less prominent edema and enhancement are present within the L3, L4 and T12 vertebrae. Fractures at these levels are favored subacute. Conus medullaris and cauda equina: Conus extends to the L2 level. No signal abnormality identified within the visualized distal spinal cord. Paraspinal and other  soft tissues: No acute abnormality identified within included portions of the abdomen/retroperitoneum. No paraspinal mass or collection. Disc levels: Multilevel disc degeneration, greatest at L3-L4 (advanced at this level). T12-L1: Mild bony retropulsion at the level of the L1 superior endplate with associated disc bulge. Facet arthrosis. Mild effacement of ventral thecal sac (without spinal cord mass effect). No significant foraminal stenosis. L1-L2: Disc bulge. Facet arthrosis and ligamentum flavum hypertrophy. Mild bilateral subarticular and central canal narrowing (without frank nerve root impingement). No significant foraminal stenosis. L2-L3: Disc bulge. Facet arthrosis and ligamentum flavum hypertrophy. No significant spinal canal stenosis. Mild right neural foraminal narrowing. L3-L4: Disc bulge with endplate osteophytes. Facet arthrosis and ligamentum flavum hypertrophy. Moderate right subarticular narrowing with crowding of the descending right L4 nerve root. Mild left subarticular narrowing (without appreciable nerve root impingement at this site). Moderate central  canal stenosis. Mild bilateral neural foraminal narrowing. L4-L5: 6 mm grade 1 anterolisthesis. Disc uncovering with disc bulge and superimposed central disc protrusion. Advanced facet arthrosis with ligamentum flavum hypertrophy. Severe bilateral subarticular and central canal stenosis with cauda equina nerve root impingement. Bilateral neural foraminal narrowing (mild to moderate right, moderate to severe left). L5-S1: Disc bulge with endplate spurring facet arthrosis (advanced right, moderate left) with ligamentum flavum hypertrophy. No significant spinal canal stenosis. Bilateral neural foraminal narrowing (mild right, moderate left). These results were called by telephone at the time of interpretation on 11/26/2021 at 6:19 pm to provider MADISON [facility] , who verbally acknowledged these results. IMPRESSION: Vertebral compression fractures at T11, T12, L1, L2, L3, L4 and L5. The L5 vertebral compression fracture is likely acute. The T12, L3 and L4 compression fractures are likely subacute. The greatest degree of vertebral body height loss is present at L2 (60%) and L5 (70%). Lumbar spondylosis, as outlined. Most notably at L4-L5, there is multifactorial severe bilateral subarticular and central canal stenosis with apparent cauda equina nerve root impingement. Spinal canal stenosis at this level is similar to the prior lumbar spine MRI of 04/30/2019. Bilateral neural foraminal narrowing (mild to moderate right, moderate to severe left). Electronically Signed   By: Kellie Simmering D.O.   On: 11/26/2021 18:20   DG Chest 2 View  Result Date: 11/26/2021 CLINICAL DATA:  Low back pain.  Worsening weakness. EXAM: CHEST - 2 VIEW COMPARISON:  07/15/2019. FINDINGS: Stable changes from previous CABG surgery. Cardiac silhouette mildly enlarged. No mediastinal or hilar masses. No evidence of adenopathy. Clear lungs.  No pleural effusion or pneumothorax. Skeletal structures are demineralized. Multiple vertebral compression  fractures T3, T7 and T11. IMPRESSION: No acute cardiopulmonary disease. Electronically Signed   By: Lajean Manes M.D.   On: 11/26/2021 15:52   DG Lumbar Spine Complete  Result Date: 11/26/2021 CLINICAL DATA:  Low back pain.  Worsening weakness. EXAM: LUMBAR SPINE - COMPLETE 4+ VIEW COMPARISON:  07/12/2021. FINDINGS: Multiple compression fractures, L1, L2 and L5, unchanged from the prior exam. Grade 1 anterolisthesis of L4 on L5, also stable. No other spondylolisthesis. Moderate to marked loss of disc height at L3-L4. Mild loss of disc height at T12-L1. Mild facet degenerative changes at L4-L5 and L5-S1. Skeletal structures are diffusely demineralized. Aortic atherosclerotic calcifications. Right upper quadrant vascular clips. Soft tissues otherwise unremarkable. IMPRESSION: 1. No acute fracture or acute finding. 2. Chronic compression fractures and degenerative changes as detailed, without change from the prior radiographs. Electronically Signed   By: Lajean Manes M.D.   On: 11/26/2021 15:49   XR C-ARM NO REPORT  Result Date: 11/14/2021 Please  see Notes tab for imaging impression.  Epidural Steroid injection  Result Date: 11/14/2021 Magnus Sinning, MD     11/23/2021  5:47 AM Lumbar Epidural Steroid Injection - Interlaminar Approach with Fluoroscopic Guidance Patient: DANEILLE WINOKUR     Date of Birth: 1932-04-20 MRN: [id number] PCP: Donnajean Lopes, MD     Visit Date: 11/14/2021  Universal Protocol:   Consent Given By: the patient Position: PRONE Additional Comments: Vital signs were monitored before and after the procedure. Patient was prepped and draped in the usual sterile fashion. The correct patient, procedure, and site was verified. Injection Procedure Details: Procedure diagnoses: Lumbar radiculopathy [M54.16] Meds Administered: Meds ordered this encounter Medications  methylPREDNISolone acetate (DEPO-MEDROL) injection 80 mg  Laterality: Midline Location/Site:  L5-S1 Needle: 3.5 in., 20 ga.  Tuohy Needle Placement: Paramedian epidural Findings:  -Comments: Excellent flow of contrast into the epidural space. Procedure Details: Using a paramedian approach from the side mentioned above, the region overlying the inferior lamina was localized under fluoroscopic visualization and the soft tissues overlying this structure were infiltrated with 4 ml. of 1% Lidocaine without Epinephrine. The Tuohy needle was inserted into the epidural space using a paramedian approach. The epidural space was localized using loss of resistance along with counter oblique bi-planar fluoroscopic views.  After negative aspirate for air, blood, and CSF, a 2 ml. volume of Isovue-250 was injected into the epidural space and the flow of contrast was observed. Radiographs were obtained for documentation purposes.  The injectate was administered into the level noted above. Additional Comments: The patient tolerated the procedure well Dressing: 2 x 2 sterile gauze and Band-Aid  Post-procedure details: Patient was observed during the procedure. Post-procedure instructions were reviewed. Patient left the clinic in stable condition.  Labs:   Basic Metabolic Panel: Recent Labs  Lab 11/26/21 1420 11/27/21 0445 11/29/21 0837  NA 130* 134* 133*  K 4.4 4.5 3.9  CL 94* 96* 99  CO2 24 26 25   GLUCOSE 160* 92 121*  BUN 16 16 13   CREATININE 0.86 0.88 0.75  CALCIUM 9.6 9.6 9.1  MG 1.9  --   --       CBC: Recent Labs  Lab 11/26/21 1420 11/27/21 0445 11/29/21 0837  WBC 22.8* 11.6* 11.9*  NEUTROABS 19.7*  --   --   HGB 14.6 12.5 13.3  HCT 40.8 35.6* 38.2  MCV 92.3 93.4 93.2  PLT 221 191 184          SIGNED:   Debbe Odea, MD  [facility] 11/29/2021, 6:03 PM

## 2021-12-16 ENCOUNTER — Other Ambulatory Visit: Payer: Self-pay | Admitting: Internal Medicine

## 2022-01-14 ENCOUNTER — Other Ambulatory Visit: Payer: Self-pay | Admitting: Internal Medicine

## 2022-05-08 ENCOUNTER — Other Ambulatory Visit (HOSPITAL_COMMUNITY): Payer: Self-pay | Admitting: *Deleted

## 2022-05-09 ENCOUNTER — Ambulatory Visit (HOSPITAL_COMMUNITY)
Admission: RE | Admit: 2022-05-09 | Discharge: 2022-05-09 | Disposition: A | Discharge status: Home / Self Care | Payer: Medicare Other | Source: Ambulatory Visit | Attending: Internal Medicine | Admitting: Internal Medicine

## 2022-05-09 DIAGNOSIS — M810 Age-related osteoporosis without current pathological fracture: Secondary | ICD-10-CM | POA: Diagnosis present

## 2022-05-09 MED ORDER — ZOLEDRONIC ACID 5 MG/100ML IV SOLN
INTRAVENOUS | Status: AC
  Filled 2022-05-09: qty 100

## 2022-05-09 MED ORDER — ZOLEDRONIC ACID 5 MG/100ML IV SOLN
5.0000 mg | Freq: Once | INTRAVENOUS | Status: AC
  Administered 2022-05-09: 5 mg via INTRAVENOUS

## 2022-05-23 ENCOUNTER — Telehealth: Payer: Self-pay | Admitting: Internal Medicine

## 2022-05-23 DIAGNOSIS — I10 Essential (primary) hypertension: Secondary | ICD-10-CM

## 2022-05-23 DIAGNOSIS — R609 Edema, unspecified: Secondary | ICD-10-CM

## 2022-05-23 DIAGNOSIS — Z79899 Other long term (current) drug therapy: Secondary | ICD-10-CM

## 2022-05-23 MED ORDER — FUROSEMIDE 20 MG PO TABS: 20.0000 mg | ORAL_TABLET | Freq: Every day | ORAL | 3 refills | Status: DC

## 2022-05-23 MED ORDER — POTASSIUM CHLORIDE CRYS ER 20 MEQ PO TBCR: 20.0000 meq | EXTENDED_RELEASE_TABLET | Freq: Every day | ORAL | 3 refills | Status: DC

## 2022-05-23 NOTE — Telephone Encounter (Signed)
Spoke with pt who reports bilateral edema of feet and ankles increasing over the past 3 months.  Pt states while she was in the hospital 11/2021 her Furosemide and KCL was stopped.  Pt is uncertain as to why it was stopped.  She denies current CP, SOB or dizziness.  She states she is not following a low sodium diet.  She does elevate feet and legs while sitting.  She has tried compression stockings in rehab but states she is unable to get the stockings on by herself.  She was given exercises by physical therapy to help with feet and ankle swelling that she continues at home.  She would like to restart Furosemide.  Current BP 130/65, HR - 78.  Pt is scheduled to see Richardson Dopp, PA-C on 07/07/2022. Pt advised Dr Harrington Challenger is not in the office today but will forward message for her to address.  Pt verbalizes understanding and thanked Therapist, sports for the callback.

## 2022-05-23 NOTE — Telephone Encounter (Signed)
Pt is aware of instructions and agreeable.  RXs sent into [facility] as requested.  Lab ordered and scheduled for 06/05/22

## 2022-05-23 NOTE — Telephone Encounter (Signed)
Pt c/o swelling: STAT is pt has developed SOB within 24 hours  How much weight have you gained and in what time span? Unsure.    If swelling, where is the swelling located? Around ankles and feet.  Are you currently taking a fluid pill? No   Are you currently SOB? No   Do you have a log of your daily weights (if so, list)?   Have you gained 3 pounds in a day or 5 pounds in a week? No   Have you traveled recently? No    Patient states she was taken off of her potasium and fluid pill while in hospital in 11/2021. She states that the swelling has been steady since out of rehab after back surgery. Requesting return call.

## 2022-05-23 NOTE — Telephone Encounter (Signed)
REview of chart It looks like she was on lasix 20 mg daily with 20 KCL daily   Please confirm SHe could resume this   I would get BMET and BNP in 10 days

## 2022-06-05 ENCOUNTER — Ambulatory Visit: Payer: Medicare Other | Attending: Internal Medicine

## 2022-06-05 DIAGNOSIS — I10 Essential (primary) hypertension: Secondary | ICD-10-CM

## 2022-06-05 DIAGNOSIS — Z79899 Other long term (current) drug therapy: Secondary | ICD-10-CM

## 2022-06-05 DIAGNOSIS — R609 Edema, unspecified: Secondary | ICD-10-CM

## 2022-06-06 LAB — BASIC METABOLIC PANEL
BUN/Creatinine Ratio: 13 (ref 12–28)
BUN: 11 mg/dL (ref 8–27)
CO2: 21 mmol/L (ref 20–29)
Calcium: 9.6 mg/dL (ref 8.7–10.3)
Chloride: 99 mmol/L (ref 96–106)
Creatinine, Ser: 0.88 mg/dL (ref 0.57–1.00)
Glucose: 113 mg/dL — ABNORMAL HIGH (ref 70–99)
Potassium: 4.9 mmol/L (ref 3.5–5.2)
Sodium: 137 mmol/L (ref 134–144)
eGFR: 64 mL/min/{1.73_m2} (ref >59)

## 2022-06-06 LAB — PRO B NATRIURETIC PEPTIDE: NT-Pro BNP: 260 pg/mL (ref 0–738)

## 2022-06-07 ENCOUNTER — Telehealth: Payer: Self-pay

## 2022-06-07 NOTE — Telephone Encounter (Signed)
-----   Message from Pricilla Riffle, MD sent at 06/06/2022  8:22 AM EDT ----- Electrolytes and kidney function are normal Fluid appears OK   How is swelling in legs? Keep on same meds

## 2022-06-07 NOTE — Telephone Encounter (Signed)
I spoke with the pt re: her lab results and she reports that her leg edema has improved... she says she mostly notices it when she is up all day moving around but elevating them gives her relief.   She is not SOB and can lay flat at night to sleep.   She has compression stockings but they are old so she has purchased new ones but with recent back pain she was unable to get them on.. ut she thinks her back is better now that she will try to start wearing the new ones. We went over strategies to putting them on.

## 2022-07-07 ENCOUNTER — Ambulatory Visit: Payer: Medicare Other | Attending: Physician Assistant | Admitting: Physician Assistant

## 2022-07-07 ENCOUNTER — Encounter: Payer: Self-pay | Admitting: Physician Assistant

## 2022-07-07 VITALS — BP 140/70 | HR 79 | Ht 63.0 in | Wt 140.0 lb

## 2022-07-07 DIAGNOSIS — I440 Atrioventricular block, first degree: Secondary | ICD-10-CM | POA: Diagnosis not present

## 2022-07-07 DIAGNOSIS — E782 Mixed hyperlipidemia: Secondary | ICD-10-CM | POA: Diagnosis not present

## 2022-07-07 DIAGNOSIS — I10 Essential (primary) hypertension: Secondary | ICD-10-CM

## 2022-07-07 DIAGNOSIS — I2510 Atherosclerotic heart disease of native coronary artery without angina pectoris: Secondary | ICD-10-CM

## 2022-07-07 MED ORDER — METOPROLOL SUCCINATE ER 25 MG PO TB24: 12.5000 mg | ORAL_TABLET | Freq: Every day | ORAL | 3 refills | Status: DC

## 2022-07-07 NOTE — Assessment & Plan Note (Signed)
I reviewed several prior EKGs.  Her PR interval has gotten longer over time.  As noted, her Toprol will be reduced to 12.5 mg daily.  If this continues to run long, we will need to consider discontinuing her Toprol.

## 2022-07-07 NOTE — Patient Instructions (Signed)
Medication Instructions:  Your physician has recommended you make the following change in your medication:   REDUCE the Metoprolol to 25 mg taking only 1/2 tablet a day *If you need a refill on your cardiac medications before your next appointment, please call your pharmacy*   Lab Work: None ordered  If you have labs (blood work) drawn today and your tests are completely normal, you will receive your results only by: MyChart Message (if you have MyChart) OR A paper copy in the mail If you have any lab test that is abnormal or we need to change your treatment, we will call you to review the results.   Testing/Procedures: None ordered   Follow-Up: At [facility], you and your health needs are our priority.  As part of our continuing mission to provide you with exceptional heart care, we have created designated Provider Care Teams.  These Care Teams include your primary Cardiologist (physician) and Advanced Practice Providers (APPs -  Physician Assistants and Nurse Practitioners) who all work together to provide you with the care you need, when you need it.  We recommend signing up for the patient portal called "MyChart".  Sign up information is provided on this After Visit Summary.  MyChart is used to connect with patients for Virtual Visits (Telemedicine).  Patients are able to view lab/test results, encounter notes, upcoming appointments, etc.  Non-urgent messages can be sent to your provider as well.   To learn more about what you can do with MyChart, go to [email or web address].    Your next appointment:   6 month(s)  Provider:   Dietrich Pates, MD  or Tereso Newcomer, PA-C         Other Instructions

## 2022-07-07 NOTE — Assessment & Plan Note (Signed)
Blood pressure is above goal.  She notes optimal blood pressures at home.  Continue amlodipine 10 mg daily, lisinopril 10 mg daily, furosemide 20 mg daily, K+ 20 mEq daily.  Reduce metoprolol as noted.  Continue to monitor at home.

## 2022-07-07 NOTE — Assessment & Plan Note (Addendum)
LDL in December 2023 was optimal at 60.  Labs from [facility] reviewed.  Continue Crestor 10 mg daily, Zetia 10 mg daily.

## 2022-07-07 NOTE — Progress Notes (Signed)
Cardiology Office Note:    Date:  07/07/2022  ID:  [name], DOB December 01, 1932, MRN [id number] PCP: Garlan Fillers, MD  [facility] Providers Cardiologist:  Dietrich Pates, MD          Patient Profile:   Coronary artery disease s/p CABG in 2000 [facility] 09/23/04: L-LAD, S-OM, S-PDA patent; Distal RCA 80 Myoview 11/29/2018: no ischemia TTE 04/05/2018: EF 55-60, mild LVH, Gr 1 DD, mild elevated RVSP, mild LAE, mild MR, mild TR  GERD,  Hyperlipidemia  Hypertension  PSVT s/p RFA ablation 2009 Monitor 01/08/2019: NSR, occ PVCs, PAC, Type 1 2nd degree AVB, pause 3 sec (beta-blocker reduced)       History of Present Illness:   [name] is a 89 y.o. female who returns for follow-up of CAD.  She was last seen by Dr. Tenny Craw 07/27/2021.  She is here alone.  She was admitted in September 2023 with compression fractures.  She spent some time in rehab.  She was taken off of furosemide around that time.  She called in recently with lower extremity edema.  Her furosemide was resumed.  Follow-up labs demonstrated normal creatinine, potassium.  She has not had chest pain or significant shortness of breath, syncope.  She does have lower extremity edema but this is improved since starting back on furosemide.  This is close to her baseline.  She does have worsening swelling when she is seated for prolonged periods of time.  This resolves with elevation.  Review of Systems  Musculoskeletal:  Positive for back pain.   see HPI    Studies Reviewed:    EKG: NSR, HR 79, first-degree AV block, PR 296, left axis deviation, right bundle branch block, QTc 444, no change from prior tracing   Risk Assessment/Calculations:     HYPERTENSION CONTROL Vitals:   07/07/22 1059 07/07/22 1256  BP: (!) 150/60 (!) 140/70    The patient's blood pressure is elevated above target today.  In order to address the patient's elevated BP: Blood pressure will be monitored at home to determine if medication changes need  to be made.          Physical Exam:   VS:  BP (!) 140/70   Pulse 79   Ht 5\' 3"  (1.6 m)   Wt 140 lb (63.5 kg)   SpO2 97%   BMI 24.80 kg/m    Wt Readings from Last 3 Encounters:  07/07/22 140 lb (63.5 kg)  07/27/21 133 lb 3.2 oz (60.4 kg)  07/30/20 132 lb (59.9 kg)    Constitutional:      Appearance: Healthy appearance. Not in distress.  Pulmonary:     Breath sounds: Normal breath sounds. No wheezing. No rales.  Cardiovascular:     Normal rate. Regular rhythm. Normal S1. Normal S2.      Murmurs: There is a grade 2/6 systolic murmur at the URSB.  Edema:    Peripheral edema present.    Ankle: trace edema of the left ankle and 1+ edema of the right ankle. Abdominal:     Palpations: Abdomen is soft.       ASSESSMENT AND PLAN:   CAD (coronary artery disease) History of CABG in 2000, patent grafts by cardiac catheterization 2006 and low risk stress test in 2020.  She is doing well without chest discomfort to suggest angina.  She does have a long first-degree block on electrocardiogram.  Given her advanced age and frail status, I have recommended decreasing her beta-blocker  dose.  If she continues to have a long first-degree AV block, we may need to discontinue this.  Continue ASA 81 mg daily, Crestor 10 mg daily.  Decrease Toprol-XL to 12.5 mg daily.  Follow-up 6 months.  She will need an EKG at her next visit.  First degree AV block I reviewed several prior EKGs.  Her PR interval has gotten longer over time.  As noted, her Toprol will be reduced to 12.5 mg daily.  If this continues to run long, we will need to consider discontinuing her Toprol.  Hyperlipidemia, mixed LDL in December 2023 was optimal at 60.  Labs from [facility] reviewed.  Continue Crestor 10 mg daily, Zetia 10 mg daily.  HYPERTENSION, BENIGN Blood pressure is above goal.  She notes optimal blood pressures at home.  Continue amlodipine 10 mg daily, lisinopril 10 mg daily, furosemide 20 mg daily, K+ 20 mEq daily.  Reduce  metoprolol as noted.  Continue to monitor at home.     Dispo:  Return in about 6 months (around 01/07/2023) for Routine Follow Up, w/ Dr. Tenny Craw.  Signed, Tereso Newcomer, PA-C

## 2022-07-07 NOTE — Assessment & Plan Note (Signed)
History of CABG in 2000, patent grafts by cardiac catheterization 2006 and low risk stress test in 2020.  She is doing well without chest discomfort to suggest angina.  She does have a long first-degree block on electrocardiogram.  Given her advanced age and frail status, I have recommended decreasing her beta-blocker dose.  If she continues to have a long first-degree AV block, we may need to discontinue this.  Continue ASA 81 mg daily, Crestor 10 mg daily.  Decrease Toprol-XL to 12.5 mg daily.  Follow-up 6 months.  She will need an EKG at her next visit.

## 2022-07-26 ENCOUNTER — Other Ambulatory Visit: Payer: Self-pay | Admitting: Internal Medicine

## 2022-07-27 ENCOUNTER — Other Ambulatory Visit: Payer: Self-pay | Admitting: Internal Medicine

## 2022-09-05 ENCOUNTER — Other Ambulatory Visit (INDEPENDENT_AMBULATORY_CARE_PROVIDER_SITE_OTHER): Payer: Medicare Other

## 2022-09-05 ENCOUNTER — Ambulatory Visit: Payer: Medicare Other | Admitting: Orthopedic Surgery

## 2022-09-05 DIAGNOSIS — M25571 Pain in right ankle and joints of right foot: Secondary | ICD-10-CM | POA: Diagnosis not present

## 2022-09-15 ENCOUNTER — Encounter: Payer: Self-pay | Admitting: Orthopedic Surgery

## 2022-09-15 NOTE — Progress Notes (Signed)
Office Visit Note   Patient: [name]           Date of Birth: 09/30/1932           MRN: [id number] Visit Date: 09/05/2022              Requested by: Garlan Fillers, MD [address] [place],  [place] [address] PCP: Garlan Fillers, MD  Chief Complaint  Patient presents with   Right Ankle - Pain   Right Foot - Pain      HPI: Bilateral foot pain, s/p ESI, multiple.recommend use her compression socks, recommended level entry into home  Assessment & Plan: Visit Diagnoses:  1. Pain in right ankle and joints of right foot     Plan: recommend use her compression socks, recommended level entry into home  Follow-Up Instructions: Return if symptoms worsen or fail to improve.   Ortho Exam  Patient is alert, oriented, no adenopathy, well-dressed, normal affect, normal respiratory effort. Good DP pulses, ankle dorsiflexion 10 degrees short of neutral, VSI brawney edema with foot swelling  Imaging: No results found. No images are attached to the encounter.  Labs: Lab Results  Component Value Date   HGBA1C 5.6 12/07/2015   ESRSEDRATE 7 11/26/2021   ESRSEDRATE 2 11/19/2008   CRP 1.0 (H) 11/26/2021   REPTSTATUS 05/01/2019 FINAL 04/30/2019   CULT (A) 04/30/2019    <10,000 COLONIES/mL INSIGNIFICANT GROWTH Performed at [facility], [address] [place], [place] [address]      Lab Results  Component Value Date   ALBUMIN 4.3 08/08/2021   ALBUMIN 4.4 06/10/2019   ALBUMIN 3.8 04/30/2019    Lab Results  Component Value Date   MG 1.9 11/26/2021   MG 1.9 11/19/2008   MG 2.0 03/31/2007   Lab Results  Component Value Date   VD25OH 81.96 11/27/2021   VD25OH 49 12/11/2008    No results found for: "PREALBUMIN"    Latest Ref Rng & Units 11/29/2021    8:37 AM 11/27/2021    4:45 AM 11/26/2021    2:20 PM  CBC EXTENDED  WBC 4.0 - 10.5 K/uL 11.9  11.6  22.8   RBC 3.87 - 5.11 MIL/uL 4.10  3.81  4.42   Hemoglobin 12.0 - 15.0 g/dL 64.4  03.4  74.2    HCT 36.0 - 46.0 % 38.2  35.6  40.8   Platelets 150 - 400 K/uL 184  191  221   NEUT# 1.7 - 7.7 K/uL   19.7   Lymph# 0.7 - 4.0 K/uL   1.1      There is no height or weight on file to calculate BMI.  Orders:  Orders Placed This Encounter  Procedures   XR Foot Complete Right   XR Ankle 2 Views Right   No orders of the defined types were placed in this encounter.    Procedures: No procedures performed  Clinical Data: No additional findings.  ROS:  All other systems negative, except as noted in the HPI. Review of Systems  Objective: Vital Signs: There were no vitals taken for this visit.  Specialty Comments:  EXAM: MRI LUMBAR SPINE WITHOUT AND WITH CONTRAST   TECHNIQUE: Multiplanar and multiecho pulse sequences of the lumbar spine were obtained without and with intravenous contrast.   CONTRAST:  6mL GADAVIST GADOBUTROL 1 MMOL/ML IV SOLN   COMPARISON:  None.   FINDINGS: Significant motion artifact is present.   Segmentation:  Standard.   Alignment: Grade 1  anterolisthesis at L4-L5 mild retrolisthesis at L1-L2 and L3-L4.   Vertebrae: Vertebral body heights are maintained apart from degenerative endplate irregularity, greatest at L3. There is minor degenerative endplate marrow edema, for example at L5-S1. No suspicious osseous lesion. No evidence of discitis or osteomyelitis.   Conus medullaris and cauda equina: Conus extends to the L1 level. Conus is unremarkable. There is possible reactive nerve root enhancement at the L4-L5 level.   Paraspinal and other soft tissues: Partially image distended bladder. Small calcified uterine fibroid. Otherwise unremarkable.   Disc levels:   L1-L2: Disc bulge and mild facet arthropathy. No significant canal or foraminal stenosis.   L2-L3: Disc bulge and mild facet arthropathy with ligamentum flavum infolding. No significant canal or foraminal stenosis.   L3-L4: Disc bulge with endplate osteophytic ridging and  moderate facet arthropathy with ligamentum flavum infolding. Moderate canal stenosis with narrowing of the lateral recesses. Mild foraminal stenosis.   L4-L5: Anterolisthesis with uncovering of disc bulge. Moderate right and marked left facet arthropathy with ligamentum flavum infolding. Severe canal stenosis with effacement of the lateral recesses and crowding of the cauda equina. Mild to moderate right and moderate left foraminal stenosis.   L5-S1: Disc bulge with endplate osteophytic ridging eccentric to the left and moderate facet arthropathy with ligamentum flavum infolding. No significant canal or right foraminal stenosis. Mild left foraminal stenosis.   IMPRESSION: Motion degraded study.   No evidence of discitis/osteomyelitis.  No epidural collection   Multilevel degenerative changes as detailed above. Most notably, there is severe canal stenosis at L4-L5 with crowding of the cauda equina.     Electronically Signed   By: Guadlupe Spanish M.D.   On: 04/30/2019 19:16  PMFS History: Patient Active Problem List   Diagnosis Date Noted   CAD (coronary artery disease) 07/07/2022   First degree AV block 07/07/2022   Non-traumatic compression fractures of vertebral column, initial encounter (HCC) 11/26/2021   Leukocytosis 11/26/2021   Hyponatremia 11/26/2021   Spinal stenosis at L4-L5 level 11/26/2021   Hypothyroidism    Chest pain 11/28/2018   Chest pain on breathing 12/06/2015   Exertional chest pain    COUGH 06/24/2009   HYPOPOTASSEMIA 12/31/2008   Pure hypercholesterolemia 12/16/2008   Hyperlipidemia, mixed 08/28/2008   HYPERTENSION, BENIGN 08/28/2008   CAD S/P CABG x 3 08/28/2008   GERD 10/16/2007   Past Medical History:  Diagnosis Date   Anemia    CAD (coronary artery disease) 2000    o LHC 09/23/04: L-LAD, S-OM, S-PDA patent; Distal RCA 80  o Myoview 11/29/2018: no ischemia  o TTE 04/05/2018: EF 55-60, mild LVH, Gr 1 DD, mild elevated RVSP, mild LAE, mild MR,  mild TR    Diabetes mellitus    DJD (degenerative joint disease)    Dysrhythmia    Edema    GERD (gastroesophageal reflux disease)    Hiatal hernia    History of blood transfusion    Hyperlipidemia    Hypertension    Hypothyroidism    IBS (irritable bowel syndrome)    SVT (supraventricular tachycardia)    with ablation    Family History  Problem Relation Age of Onset   Heart attack Mother 45       died   Heart attack Father 38       died   Breast cancer Sister    Diabetes Sister        and brother   Heart disease Other        sister, father  and brother   Coronary artery disease Brother        PCI    Past Surgical History:  Procedure Laterality Date   APPENDECTOMY     BREAST SURGERY     biopsy per left breast    CARDIAC CATHETERIZATION     CHOLECYSTECTOMY  2005   CORONARY ARTERY BYPASS GRAFT  2000   EYE SURGERY     cataract surgery bilat    REPLACEMENT TOTAL KNEE  2009   left   Social History   Occupational History   Occupation: retired    Associate Professor: RETIRED  Tobacco Use   Smoking status: Never   Smokeless tobacco: Never  Substance and Sexual Activity   Alcohol use: No   Drug use: No   Sexual activity: Not on file

## 2022-10-11 ENCOUNTER — Other Ambulatory Visit: Payer: Self-pay | Admitting: Internal Medicine

## 2023-01-06 ENCOUNTER — Other Ambulatory Visit: Payer: Self-pay | Admitting: Internal Medicine

## 2023-01-07 NOTE — Progress Notes (Unsigned)
.    Cardiology Office Note   Date:  01/07/2023   ID:  [name], DOB 03-Oct-1932, MRN [id number]  PCP:  Garlan Fillers, MD  Cardiologist:   Dietrich Pates, MD    F/U of CAD     History of Present Illness: [name] is a 89 y.o. female with a history of CAD (s/p CABG in 2000.  Myoview in 2013 was normal.  In 2016 admitted with CP   Myovue again done,  Normal Echo in 2020:   Normal LV function  Oct 2020   Admitted again with chest tightness  Myovue showed no ischemia       I saw the pt in clnic in June 2022  The pt says she does have "gas" after eating food.  SHe also notes occasional CP that occurs with and without activity    SHe blames on arthritits    She says walking up a hill she does OK   No SOB or CP   The pt says her BP at home is much better   130/ 59 to 80  I saw the pt in 2023   She was seen by Wende Mott since   Outpatient Medications Prior to Visit  Medication Sig Dispense Refill   acetaminophen (TYLENOL) 500 MG tablet Take 2 tablets (1,000 mg total) by mouth 3 (three) times daily. 30 tablet 0   amLODipine (NORVASC) 10 MG tablet TAKE 1 TABLET BY MOUTH EVERY DAY 90 tablet 2   aspirin 81 MG tablet Take 81 mg by mouth daily.     calcium carbonate (TUMS - DOSED IN MG ELEMENTAL CALCIUM) 500 MG chewable tablet Chew 1 tablet by mouth as needed for indigestion or heartburn.     ergocalciferol (VITAMIN D2) 50000 UNITS capsule Take 50,000 Units by mouth once a week.  Saturday     esomeprazole (NEXIUM) 20 MG capsule Take 20 mg by mouth daily before breakfast.     ezetimibe (ZETIA) 10 MG tablet TAKE 1/2 TABLET BY MOUTH DAILY 45 tablet 3   furosemide (LASIX) 20 MG tablet Take 1 tablet (20 mg total) by mouth daily. 90 tablet 3   levothyroxine (SYNTHROID, LEVOTHROID) 75 MCG tablet Take 75 mcg by mouth daily.     lisinopril (ZESTRIL) 10 MG tablet TAKE 1 TABLET BY MOUTH EVERY DAY 90 tablet 1   magnesium hydroxide (MILK OF MAGNESIA) 400 MG/5ML suspension Take 30 mLs by mouth  2 (two) times daily as needed for mild constipation. 355 mL 0   metoprolol succinate (TOPROL XL) 25 MG 24 hr tablet Take 0.5 tablets (12.5 mg total) by mouth daily. 45 tablet 3   nitroGLYCERIN (NITROSTAT) 0.4 MG SL tablet Place 1 tablet (0.4 mg total) under the tongue every 5 (five) minutes x 3 doses as needed for chest pain. 25 tablet 2   ONETOUCH VERIO test strip CHECK BLOOD SUGARS ONCE DAILY     potassium chloride SA (KLOR-CON M) 20 MEQ tablet Take 1 tablet (20 mEq total) by mouth daily. 90 tablet 3   rosuvastatin (CRESTOR) 10 MG tablet TAKE 1 TABLET BY MOUTH EVERY DAY 90 tablet 3   No facility-administered medications prior to visit.     Allergies:   Actos [pioglitazone hydrochloride], Atorvastatin calcium, Codeine, Lipitor [atorvastatin], Metformin, and Metoclopramide   Past Medical History:  Diagnosis Date   Anemia    CAD (coronary artery disease) 2000    o LHC 09/23/04: L-LAD, S-OM, S-PDA patent; Distal RCA 80  o  Myoview 11/29/2018: no ischemia  o TTE 04/05/2018: EF 55-60, mild LVH, Gr 1 DD, mild elevated RVSP, mild LAE, mild MR, mild TR    Diabetes mellitus    DJD (degenerative joint disease)    Dysrhythmia    Edema    GERD (gastroesophageal reflux disease)    Hiatal hernia    History of blood transfusion    Hyperlipidemia    Hypertension    Hypothyroidism    IBS (irritable bowel syndrome)    SVT (supraventricular tachycardia)    with ablation    Past Surgical History:  Procedure Laterality Date   APPENDECTOMY     BREAST SURGERY     biopsy per left breast    CARDIAC CATHETERIZATION     CHOLECYSTECTOMY  2005   CORONARY ARTERY BYPASS GRAFT  2000   EYE SURGERY     cataract surgery bilat    REPLACEMENT TOTAL KNEE  2009   left     Social History:  The patient  reports that she has never smoked. She has never used smokeless tobacco. She reports that she does not drink alcohol and does not use drugs.   Family History:  The patient's family history includes Breast  cancer in her sister; Coronary artery disease in her brother; Diabetes in her sister; Heart attack (age of onset: 93) in her father; Heart attack (age of onset: 70) in her mother; Heart disease in an other family member.    ROS:  Please see the history of present illness. All other systems are reviewed and  Negative to the above problem except as noted.    PHYSICAL EXAM: VS   146/88  P 74  Sat 97%   Wt 133 GEN: Thin 89 yo  in no acute distress  HEENT: normal  Neck: JVP is normal  Cardiac: RRR;  S1, S2  No S3  No murmurs  No  LE   edema  Respiratory:  clear to auscultation bilaterally, normal work of breathing GI: soft, nontender, nondistended, + BS  No hepatomegaly  MS: no deformity Moving all extremities   Skin: warm and dry, no rash Neuro:  Strength and sensation are intact Psych: euthymic mood, full affect   EKG:  EKG is  ordered today   SR 74   First degree AV block   PR 234 msec  RBBB  LAFB    Lipid Panel    Component Value Date/Time   CHOL 151 07/30/2020 1326   TRIG 134 07/30/2020 1326   HDL 82 07/30/2020 1326   CHOLHDL 1.8 07/30/2020 1326   CHOLHDL 2.6 12/07/2015 0440   VLDL 24 12/07/2015 0440   LDLCALC 47 07/30/2020 1326      Wt Readings from Last 3 Encounters:  07/07/22 140 lb (63.5 kg)  07/27/21 133 lb 3.2 oz (60.4 kg)  07/30/20 132 lb (59.9 kg)      ASSESSMENT AND PLAN:  1  CAD   Remote CABG  Myoview in 2020 without ischemia  Pt currently denies symptoms     2  HL Pt now on Crestor   LDL in 2022 was 60  HDL 78  Follow     3 HTN  Pt says her BP runs better most of time   Follow  .  Check:  CBC, BMET,  Follow up in Feb      Current medicines are reviewed at length with the patient today.  The patient does not have concerns regarding medicines.  Signed, Dietrich Pates, MD  01/07/2023 9:48 PM    [facility] [address] [place], [place], [place]  [address] Phone: [phone]; Fax: [phone]

## 2023-01-08 ENCOUNTER — Ambulatory Visit: Payer: Medicare Other | Attending: Internal Medicine | Admitting: Internal Medicine

## 2023-01-08 ENCOUNTER — Encounter: Payer: Self-pay | Admitting: Internal Medicine

## 2023-01-08 VITALS — BP 130/80 | HR 74 | Ht 63.0 in | Wt 130.4 lb

## 2023-01-08 DIAGNOSIS — Z79899 Other long term (current) drug therapy: Secondary | ICD-10-CM

## 2023-01-08 DIAGNOSIS — E782 Mixed hyperlipidemia: Secondary | ICD-10-CM | POA: Diagnosis not present

## 2023-01-08 DIAGNOSIS — I10 Essential (primary) hypertension: Secondary | ICD-10-CM

## 2023-01-08 DIAGNOSIS — I2510 Atherosclerotic heart disease of native coronary artery without angina pectoris: Secondary | ICD-10-CM | POA: Diagnosis not present

## 2023-01-08 NOTE — Patient Instructions (Addendum)
Medication Instructions:   *If you need a refill on your cardiac medications before your next appointment, please call your pharmacy*   Lab Work: CBC, BMET, TSH, HGBA1C, NMR   If you have labs (blood work) drawn today and your tests are completely normal, you will receive your results only by: MyChart Message (if you have MyChart) OR A paper copy in the mail If you have any lab test that is abnormal or we need to change your treatment, we will call you to review the results.   Testing/Procedures:    Follow-Up: At [facility], you and your health needs are our priority.  As part of our continuing mission to provide you with exceptional heart care, we have created designated Provider Care Teams.  These Care Teams include your primary Cardiologist (physician) and Advanced Practice Providers (APPs -  Physician Assistants and Nurse Practitioners) who all work together to provide you with the care you need, when you need it.  We recommend signing up for the patient portal called "MyChart".  Sign up information is provided on this After Visit Summary.  MyChart is used to connect with patients for Virtual Visits (Telemedicine).  Patients are able to view lab/test results, encounter notes, upcoming appointments, etc.  Non-urgent messages can be sent to your provider as well.   To learn more about what you can do with MyChart, go to [email or web address].    Your next appointment:   6 month(s)  Provider:   Dietrich Pates, MD     Other Instructions

## 2023-01-09 ENCOUNTER — Other Ambulatory Visit: Payer: Self-pay | Admitting: Internal Medicine

## 2023-01-09 LAB — NMR, LIPOPROFILE
Cholesterol, Total: 148 mg/dL (ref 100–199)
HDL Particle Number: 40.3 umol/L (ref >=30.5)
HDL-C: 73 mg/dL (ref >39)
LDL Particle Number: 522 nmol/L (ref <1000)
LDL Size: 20.1 nm — ABNORMAL LOW (ref >20.5)
LDL-C (NIH Calc): 51 mg/dL (ref 0–99)
LP-IR Score: 37 (ref <=45)
Small LDL Particle Number: 337 nmol/L (ref <=527)
Triglycerides: 145 mg/dL (ref 0–149)

## 2023-01-09 LAB — CBC
Hematocrit: 39.8 % (ref 34.0–46.6)
Hemoglobin: 13.7 g/dL (ref 11.1–15.9)
MCH: 32.2 pg (ref 26.6–33.0)
MCHC: 34.4 g/dL (ref 31.5–35.7)
MCV: 93 fL (ref 79–97)
Platelets: 210 10*3/uL (ref 150–450)
RBC: 4.26 x10E6/uL (ref 3.77–5.28)
RDW: 13.1 % (ref 11.7–15.4)
WBC: 8.4 10*3/uL (ref 3.4–10.8)

## 2023-01-09 LAB — BASIC METABOLIC PANEL
BUN/Creatinine Ratio: 15 (ref 12–28)
BUN: 12 mg/dL (ref 8–27)
CO2: 25 mmol/L (ref 20–29)
Calcium: 9.5 mg/dL (ref 8.7–10.3)
Chloride: 95 mmol/L — ABNORMAL LOW (ref 96–106)
Creatinine, Ser: 0.82 mg/dL (ref 0.57–1.00)
Glucose: 125 mg/dL — ABNORMAL HIGH (ref 70–99)
Potassium: 4.3 mmol/L (ref 3.5–5.2)
Sodium: 131 mmol/L — ABNORMAL LOW (ref 134–144)
eGFR: 69 mL/min/{1.73_m2} (ref >59)

## 2023-01-09 LAB — HEMOGLOBIN A1C
Est. average glucose Bld gHb Est-mCnc: 117 mg/dL
Hgb A1c MFr Bld: 5.7 % — ABNORMAL HIGH (ref 4.8–5.6)

## 2023-01-09 LAB — TSH: TSH: 0.217 u[IU]/mL — ABNORMAL LOW (ref 0.450–4.500)

## 2023-01-16 ENCOUNTER — Emergency Department (HOSPITAL_COMMUNITY)
Admission: EM | Admit: 2023-01-16 | Discharge: 2023-01-16 | Disposition: A | Discharge status: Home / Self Care | Payer: Medicare Other | Attending: Emergency Medicine | Admitting: Emergency Medicine

## 2023-01-16 ENCOUNTER — Emergency Department (HOSPITAL_COMMUNITY): Payer: Medicare Other

## 2023-01-16 ENCOUNTER — Encounter (HOSPITAL_COMMUNITY): Payer: Self-pay | Admitting: Emergency Medicine

## 2023-01-16 ENCOUNTER — Other Ambulatory Visit: Payer: Self-pay

## 2023-01-16 DIAGNOSIS — Z79899 Other long term (current) drug therapy: Secondary | ICD-10-CM | POA: Insufficient documentation

## 2023-01-16 DIAGNOSIS — R079 Chest pain, unspecified: Secondary | ICD-10-CM | POA: Diagnosis present

## 2023-01-16 DIAGNOSIS — E871 Hypo-osmolality and hyponatremia: Secondary | ICD-10-CM | POA: Insufficient documentation

## 2023-01-16 DIAGNOSIS — R600 Localized edema: Secondary | ICD-10-CM | POA: Insufficient documentation

## 2023-01-16 DIAGNOSIS — Z7982 Long term (current) use of aspirin: Secondary | ICD-10-CM | POA: Diagnosis not present

## 2023-01-16 LAB — BASIC METABOLIC PANEL
Anion gap: 9 (ref 5–15)
BUN: 11 mg/dL (ref 8–23)
CO2: 23 mmol/L (ref 22–32)
Calcium: 9.4 mg/dL (ref 8.9–10.3)
Chloride: 95 mmol/L — ABNORMAL LOW (ref 98–111)
Creatinine, Ser: 0.7 mg/dL (ref 0.44–1.00)
GFR, Estimated: >60 mL/min (ref >60)
Glucose, Bld: 139 mg/dL — ABNORMAL HIGH (ref 70–99)
Potassium: 4.5 mmol/L (ref 3.5–5.1)
Sodium: 127 mmol/L — ABNORMAL LOW (ref 135–145)

## 2023-01-16 LAB — CBC
HCT: 37.9 % (ref 36.0–46.0)
Hemoglobin: 13.4 g/dL (ref 12.0–15.0)
MCH: 32.1 pg (ref 26.0–34.0)
MCHC: 35.4 g/dL (ref 30.0–36.0)
MCV: 90.7 fL (ref 80.0–100.0)
Platelets: 224 10*3/uL (ref 150–400)
RBC: 4.18 MIL/uL (ref 3.87–5.11)
RDW: 13 % (ref 11.5–15.5)
WBC: 9.2 10*3/uL (ref 4.0–10.5)
nRBC: 0 % (ref 0.0–0.2)

## 2023-01-16 LAB — TROPONIN I (HIGH SENSITIVITY)
Troponin I (High Sensitivity): 7 ng/L (ref <18)
Troponin I (High Sensitivity): 7 ng/L (ref <18)

## 2023-01-16 MED ORDER — SODIUM CHLORIDE 1 G PO TABS
1.0000 g | ORAL_TABLET | Freq: Once | ORAL | Status: AC
  Administered 2023-01-16: 1 g via ORAL
  Filled 2023-01-16: qty 1

## 2023-01-16 NOTE — ED Provider Notes (Signed)
[place] EMERGENCY DEPARTMENT AT [facility] Provider Note   CSN: [id number] Arrival date & time: 01/16/23  1400     History {Add pertinent medical, surgical, social history, OB history to HPI:1} Chief Complaint  Patient presents with  . Chest Pain    [name] is a 89 y.o. female.   Chest Pain  Pt was at the store when she started to feel flushed.  She states she was feeling strange. She had some pain in her shoulder.  It lasted just for a few minutes.  She has been feeling fine since.  She went to an urgent care and was told to come to the ED for evaluation.      Home Medications Prior to Admission medications   Medication Sig Start Date End Date Taking? Authorizing Provider  acetaminophen (TYLENOL) 500 MG tablet Take 2 tablets (1,000 mg total) by mouth 3 (three) times daily. 11/29/21   Calvert Cantor, MD  amLODipine (NORVASC) 10 MG tablet TAKE 1 TABLET BY MOUTH EVERY DAY 10/11/22   Pricilla Riffle, MD  aspirin 81 MG tablet Take 81 mg by mouth daily.    [provider]  calcium carbonate (TUMS - DOSED IN MG ELEMENTAL CALCIUM) 500 MG chewable tablet Chew 1 tablet by mouth as needed for indigestion or heartburn.    [provider]  ergocalciferol (VITAMIN D2) 50000 UNITS capsule Take 50,000 Units by mouth once a week.  Saturday    [provider]  esomeprazole (NEXIUM) 20 MG capsule Take 20 mg by mouth daily before breakfast.    [provider]  ezetimibe (ZETIA) 10 MG tablet TAKE 1/2 TABLET BY MOUTH DAILY 07/27/22   Pricilla Riffle, MD  furosemide (LASIX) 20 MG tablet Take 1 tablet (20 mg total) by mouth daily. 05/23/22   Pricilla Riffle, MD  levothyroxine (SYNTHROID, LEVOTHROID) 75 MCG tablet Take 75 mcg by mouth daily.    [provider]  lisinopril (ZESTRIL) 10 MG tablet TAKE 1 TABLET BY MOUTH EVERY DAY 01/10/23   Pricilla Riffle, MD  magnesium hydroxide (MILK OF MAGNESIA) 400 MG/5ML suspension Take 30 mLs by mouth 2 (two)  times daily as needed for mild constipation. 11/29/21   Calvert Cantor, MD  metoprolol succinate (TOPROL-XL) 25 MG 24 hr tablet TAKE 1 TABLET BY MOUTH EVERY DAY 01/08/23   Pricilla Riffle, MD  nitroGLYCERIN (NITROSTAT) 0.4 MG SL tablet Place 1 tablet (0.4 mg total) under the tongue every 5 (five) minutes x 3 doses as needed for chest pain. 12/07/15   Strader, Lennart Pall, PA-C  ONETOUCH VERIO test strip CHECK BLOOD SUGARS ONCE DAILY 05/06/19   [provider]  potassium chloride SA (KLOR-CON M) 20 MEQ tablet Take 1 tablet (20 mEq total) by mouth daily. 05/23/22   Pricilla Riffle, MD  rosuvastatin (CRESTOR) 10 MG tablet TAKE 1 TABLET BY MOUTH EVERY DAY 01/10/23   Pricilla Riffle, MD      Allergies    Actos [pioglitazone hydrochloride], Atorvastatin calcium, Codeine, Lipitor [atorvastatin], Metformin, and Metoclopramide    Review of Systems   Review of Systems  Cardiovascular:  Positive for chest pain.    Physical Exam Updated Vital Signs BP (!) 165/76   Pulse 83   Temp 98.6 F (37 C) (Oral)   Resp 12   Ht 1.6 m (5\' 3" )   Wt 59 kg   SpO2 96%   BMI 23.03 kg/m  Physical Exam  ED Results / Procedures /  Treatments   Labs (all labs ordered are listed, but only abnormal results are displayed) Labs Reviewed  BASIC METABOLIC PANEL - Abnormal; Notable for the following components:      Result Value   Sodium 127 (*)    Chloride 95 (*)    Glucose, Bld 139 (*)    All other components within normal limits  CBC  TROPONIN I (HIGH SENSITIVITY)  TROPONIN I (HIGH SENSITIVITY)    EKG None  Radiology DG Chest 2 View  Result Date: 01/16/2023 CLINICAL DATA:  Chest pain EXAM: CHEST - 2 VIEW COMPARISON:  11/26/2021 FINDINGS: Prior CABG. Heart and mediastinal contours are within normal limits. No focal opacities or effusions. No acute bony abnormality. Prior CABG. Aortic atherosclerosis. IMPRESSION: No active cardiopulmonary disease. Electronically Signed   By: Charlett Nose M.D.   On:  01/16/2023 19:03    Procedures Procedures  {Document cardiac monitor, telemetry assessment procedure when appropriate:1}  Medications Ordered in ED Medications - No data to display  ED Course/ Medical Decision Making/ A&P Clinical Course as of 01/16/23 2112  Tue Jan 16, 2023  2109 Sodium level decreased since last.  Troponin normal.  CBC normal [JK]  2110 CXR without acute findings [JK]    Clinical Course User Index [JK] Linwood Dibbles, MD   {   Click here for ABCD2, HEART and other calculatorsREFRESH Note before signing :1}                              Medical Decision Making Amount and/or Complexity of Data Reviewed Labs: ordered. Radiology: ordered.   ***  {Document critical care time when appropriate:1} {Document review of labs and clinical decision tools ie heart score, Chads2Vasc2 etc:1}  {Document your independent review of radiology images, and any outside records:1} {Document your discussion with family members, caretakers, and with consultants:1} {Document social determinants of health affecting pt's care:1} {Document your decision making why or why not admission, treatments were needed:1} Final Clinical Impression(s) / ED Diagnoses Final diagnoses:  None    Rx / DC Orders ED Discharge Orders     None

## 2023-01-16 NOTE — Discharge Instructions (Addendum)
Follow-up with your doctor to be rechecked.  Return to the ER for recurrent symptoms.

## 2023-01-16 NOTE — ED Triage Notes (Signed)
Patient arrives ambulatory by POV with rolling walker c/o generalized chest pain, shoulder pain and hypertension onset of today.

## 2023-01-17 ENCOUNTER — Telehealth: Payer: Self-pay | Admitting: Internal Medicine

## 2023-01-17 NOTE — Telephone Encounter (Signed)
Tried to return call to patient. Phone has busy signal x3  However I do not see any recent results from our office.

## 2023-01-17 NOTE — Telephone Encounter (Signed)
 Patient is returning call for results. Please advise

## 2023-01-19 ENCOUNTER — Telehealth: Payer: Self-pay

## 2023-01-19 NOTE — Telephone Encounter (Signed)
I called the pt to give her the lab results nd see how she was after being in the ED.. she reports that she was out all day that day and when using her walker so much it gives her arm and shoulder pain... she has osteoarthritis.. She says she was hurting and started to get light headed but she did not pass out and was needing to "rest" for a few minutes. She says that since she has been home she is feeling much better. She declined needing a sooner appt... she will let us know how she is doing in a few days.

## 2023-01-19 NOTE — Telephone Encounter (Signed)
From Dr Tenny Craw:  Aimee Riffle, MD  Bertram Millard, RN PT just seen in ER   Felt better on D/C   Not sure when she is going to be seen again. At least check in to see how doing   COuld set up appt if not feeling good

## 2023-01-22 NOTE — Telephone Encounter (Signed)
Pt has been advised her lab results.. will close this encounter.

## 2023-03-18 ENCOUNTER — Other Ambulatory Visit: Payer: Self-pay | Admitting: Internal Medicine

## 2023-03-20 MED ORDER — POTASSIUM CHLORIDE CRYS ER 20 MEQ PO TBCR: 20.0000 meq | EXTENDED_RELEASE_TABLET | Freq: Every day | ORAL | 3 refills | Status: DC

## 2023-03-27 ENCOUNTER — Other Ambulatory Visit: Payer: Self-pay | Admitting: Internal Medicine

## 2023-04-20 ENCOUNTER — Other Ambulatory Visit: Payer: Self-pay | Admitting: Internal Medicine

## 2023-04-25 ENCOUNTER — Other Ambulatory Visit: Payer: Self-pay | Admitting: Internal Medicine

## 2023-06-07 ENCOUNTER — Other Ambulatory Visit (HOSPITAL_COMMUNITY): Payer: Self-pay | Admitting: *Deleted

## 2023-06-08 ENCOUNTER — Ambulatory Visit (HOSPITAL_COMMUNITY)
Admission: RE | Admit: 2023-06-08 | Discharge: 2023-06-08 | Disposition: A | Discharge status: Home / Self Care | Source: Ambulatory Visit | Attending: Internal Medicine | Admitting: Internal Medicine

## 2023-06-08 DIAGNOSIS — M810 Age-related osteoporosis without current pathological fracture: Secondary | ICD-10-CM | POA: Insufficient documentation

## 2023-06-08 MED ORDER — ZOLEDRONIC ACID 5 MG/100ML IV SOLN: 5.0000 mg | Freq: Once | INTRAVENOUS | Status: AC

## 2023-06-08 MED ORDER — ZOLEDRONIC ACID 5 MG/100ML IV SOLN
INTRAVENOUS | Status: AC
  Administered 2023-06-08: 5 mg via INTRAVENOUS
  Filled 2023-06-08: qty 100

## 2023-07-03 ENCOUNTER — Other Ambulatory Visit: Payer: Self-pay | Admitting: Internal Medicine

## 2023-07-07 ENCOUNTER — Other Ambulatory Visit: Payer: Self-pay | Admitting: Internal Medicine

## 2023-09-05 NOTE — Progress Notes (Unsigned)
 .    Cardiology Office Note   Date:  09/05/2023   ID:  [name], DOB 1932/03/19, MRN [id number]  PCP:  Yolande Toribio MATSU, MD  Cardiologist:   Vina Gull, MD    F/U of CAD     History of Present Illness: [name] is a 89 y.o. female with a history of CAD (s/p CABG in 2000.  Myoview  in 2013 was normal.  In 2016 admitted with CP   Myovue again done,  Normal Echo in 2020:   Normal LV function  Oct 2020   Admitted again with chest tightness  Myovue showed no ischemia       I saw the pt in 2023   She was seen by GORMAN Ferrier since  The pt denies CP  Breathing is OK   She does say that she feels tired a lot     Denies dizziness   no palpitations   I saw the pt in Nov 2024  Outpatient Medications Prior to Visit  Medication Sig Dispense Refill   acetaminophen  (TYLENOL ) 500 MG tablet Take 2 tablets (1,000 mg total) by mouth 3 (three) times daily. 30 tablet 0   amLODipine  (NORVASC ) 10 MG tablet TAKE 1 TABLET BY MOUTH EVERY DAY 90 tablet 2   aspirin  81 MG tablet Take 81 mg by mouth daily.     calcium  carbonate (TUMS - DOSED IN MG ELEMENTAL CALCIUM ) 500 MG chewable tablet Chew 1 tablet by mouth as needed for indigestion or heartburn.     ergocalciferol  (VITAMIN D2) 50000 UNITS capsule Take 50,000 Units by mouth once a week.  Saturday     esomeprazole (NEXIUM) 20 MG capsule Take 20 mg by mouth daily before breakfast.     ezetimibe  (ZETIA ) 10 MG tablet TAKE 1/2 TABLET BY MOUTH DAILY 45 tablet 3   furosemide  (LASIX ) 20 MG tablet TAKE 1 TABLET BY MOUTH EVERY DAY 90 tablet 3   levothyroxine  (SYNTHROID , LEVOTHROID) 75 MCG tablet Take 75 mcg by mouth daily.     lisinopril  (ZESTRIL ) 10 MG tablet TAKE 1 TABLET BY MOUTH EVERY DAY 90 tablet 2   magnesium  hydroxide (MILK OF MAGNESIA) 400 MG/5ML suspension Take 30 mLs by mouth 2 (two) times daily as needed for mild constipation. 355 mL 0   metoprolol  succinate (TOPROL -XL) 25 MG 24 hr tablet TAKE 1 TABLET BY MOUTH EVERY DAY 90 tablet 3    nitroGLYCERIN  (NITROSTAT ) 0.4 MG SL tablet Place 1 tablet (0.4 mg total) under the tongue every 5 (five) minutes x 3 doses as needed for chest pain. 25 tablet 2   ONETOUCH VERIO test strip CHECK BLOOD SUGARS ONCE DAILY     potassium chloride  SA (KLOR-CON  M) 20 MEQ tablet Take 1 tablet (20 mEq total) by mouth daily. 90 tablet 3   rosuvastatin  (CRESTOR ) 10 MG tablet TAKE 1 TABLET BY MOUTH EVERY DAY 30 tablet 0   No facility-administered medications prior to visit.     Allergies:   Actos [pioglitazone hydrochloride], Atorvastatin  calcium , Codeine, Lipitor  [atorvastatin ], Metformin, and Metoclopramide   Past Medical History:  Diagnosis Date   Anemia    CAD (coronary artery disease) 2000    o LHC 09/23/04: L-LAD, S-OM, S-PDA patent; Distal RCA 80  o Myoview  11/29/2018: no ischemia  o TTE 04/05/2018: EF 55-60, mild LVH, Gr 1 DD, mild elevated RVSP, mild LAE, mild MR, mild TR    Diabetes mellitus    DJD (degenerative joint disease)    Dysrhythmia  Edema    GERD (gastroesophageal reflux disease)    Hiatal hernia    History of blood transfusion    Hyperlipidemia    Hypertension    Hypothyroidism    IBS (irritable bowel syndrome)    SVT (supraventricular tachycardia) (HCC)    with ablation    Past Surgical History:  Procedure Laterality Date   APPENDECTOMY     BREAST SURGERY     biopsy per left breast    CARDIAC CATHETERIZATION     CHOLECYSTECTOMY  2005   CORONARY ARTERY BYPASS GRAFT  2000   EYE SURGERY     cataract surgery bilat    REPLACEMENT TOTAL KNEE  2009   left     Social History:  The patient  reports that she has never smoked. She has never used smokeless tobacco. She reports that she does not drink alcohol and does not use drugs.   Family History:  The patient's family history includes Breast cancer in her sister; Coronary artery disease in her brother; Diabetes in her sister; Heart attack (age of onset: 2) in her father; Heart attack (age of onset: 6) in her mother;  Heart disease in an other family member.    ROS:  Please see the history of present illness. All other systems are reviewed and  Negative to the above problem except as noted.    PHYSICAL EXAM: VS   `130/80  P 74  Wt 130   GEN: Thin 89 yo  in no acute distress  HEENT: normal  Neck: JVP is normal    Cardiac: RRR;  S1, S2    No murmurs   Triv  edema  Respiratory:  clear to auscultation  GI: soft, nontender, No hepatomegaly    EKG:  EKG is  ordered today   SR   First degree AV block   PR 252 msec  RBBB  LAFB    Lipid Panel    Component Value Date/Time   CHOL 151 07/30/2020 1326   TRIG 134 07/30/2020 1326   HDL 82 07/30/2020 1326   CHOLHDL 1.8 07/30/2020 1326   CHOLHDL 2.6 12/07/2015 0440   VLDL 24 12/07/2015 0440   LDLCALC 47 07/30/2020 1326      Wt Readings from Last 3 Encounters:  06/08/23 135 lb (61.2 kg)  01/16/23 130 lb (59 kg)  01/08/23 130 lb 6.4 oz (59.1 kg)      ASSESSMENT AND PLAN:  1  CAD   Remote CABG  Myoview  in 2020 without ischemia  Pt currently denies CP   I am not convinced fatigue is anginal equivalent   Follow   Check labs    2  HL Pt now on Crestor    Will check lipomed   3 HTN  BP is well controlled   4  Fatigue   Will check CBC, TSH, electrolytes     Follow up in May 2025     Current medicines are reviewed at length with the patient today.  The patient does not have concerns regarding medicines.  Signed, Vina Gull, MD  09/05/2023 10:27 AM    [facility] [address] [place], [place], [place]  [address] Phone: [phone]; Fax: [phone]

## 2023-09-06 ENCOUNTER — Ambulatory Visit: Attending: Internal Medicine | Admitting: Internal Medicine

## 2023-09-06 ENCOUNTER — Encounter: Payer: Self-pay | Admitting: Internal Medicine

## 2023-09-06 ENCOUNTER — Other Ambulatory Visit: Payer: Self-pay | Admitting: *Deleted

## 2023-09-06 VITALS — BP 132/80 | HR 76 | Ht 63.0 in | Wt 128.0 lb

## 2023-09-06 DIAGNOSIS — Z79899 Other long term (current) drug therapy: Secondary | ICD-10-CM | POA: Diagnosis not present

## 2023-09-06 DIAGNOSIS — M791 Myalgia, unspecified site: Secondary | ICD-10-CM

## 2023-09-06 NOTE — Patient Instructions (Signed)
 Medication Instructions:  Please discontinue your potassium chloride . Continue all other medications as listed.  *If you need a refill on your cardiac medications before your next appointment, please call your pharmacy*  Lab Work: Please have blood work in 2 weeks as ordered.  If you have labs (blood work) drawn today and your tests are completely normal, you will receive your results only by: MyChart Message (if you have MyChart) OR A paper copy in the mail If you have any lab test that is abnormal or we need to change your treatment, we will call you to review the results.  Follow-Up: At [facility], you and your health needs are our priority.  As part of our continuing mission to provide you with exceptional heart care, our providers are all part of one team.  This team includes your primary Cardiologist (physician) and Advanced Practice Providers or APPs (Physician Assistants and Nurse Practitioners) who all work together to provide you with the care you need, when you need it.  Your next appointment:   6 month(s)  Provider:   Vina Gull, MD    We recommend signing up for the patient portal called MyChart.  Sign up information is provided on this After Visit Summary.  MyChart is used to connect with patients for Virtual Visits (Telemedicine).  Patients are able to view lab/test results, encounter notes, upcoming appointments, etc.  Non-urgent messages can be sent to your provider as well.   To learn more about what you can do with MyChart, go to [email or web address].

## 2023-09-20 ENCOUNTER — Other Ambulatory Visit: Payer: Self-pay | Admitting: Internal Medicine

## 2023-09-21 LAB — COMPREHENSIVE METABOLIC PANEL WITH GFR
ALT: 12 IU/L (ref 0–32)
AST: 20 IU/L (ref 0–40)
Albumin: 4.8 g/dL — ABNORMAL HIGH (ref 3.7–4.7)
Alkaline Phosphatase: 56 IU/L (ref 44–121)
BUN/Creatinine Ratio: 14 (ref 12–28)
BUN: 12 mg/dL (ref 8–27)
Bilirubin Total: 0.7 mg/dL (ref 0.0–1.2)
CO2: 20 mmol/L (ref 20–29)
Calcium: 9.5 mg/dL (ref 8.7–10.3)
Chloride: 97 mmol/L (ref 96–106)
Creatinine, Ser: 0.83 mg/dL (ref 0.57–1.00)
Globulin, Total: 2.5 g/dL (ref 1.5–4.5)
Glucose: 107 mg/dL — ABNORMAL HIGH (ref 70–99)
Potassium: 4.5 mmol/L (ref 3.5–5.2)
Sodium: 136 mmol/L (ref 134–144)
Total Protein: 7.3 g/dL (ref 6.0–8.5)
eGFR: 68 mL/min/1.73 (ref >59)

## 2023-09-21 LAB — CBC
Hematocrit: 42.3 % (ref 34.0–46.6)
Hemoglobin: 14.3 g/dL (ref 11.1–15.9)
MCH: 31.9 pg (ref 26.6–33.0)
MCHC: 33.8 g/dL (ref 31.5–35.7)
MCV: 94 fL (ref 79–97)
Platelets: 258 x10E3/uL (ref 150–450)
RBC: 4.48 x10E6/uL (ref 3.77–5.28)
RDW: 14.4 % (ref 11.7–15.4)
WBC: 10.3 x10E3/uL (ref 3.4–10.8)

## 2023-09-21 LAB — ANA: Anti Nuclear Antibody (ANA): NEGATIVE

## 2023-09-21 LAB — SEDIMENTATION RATE: Sed Rate: 4 mm/h (ref 0–40)

## 2023-09-21 LAB — VITAMIN D 25 HYDROXY (VIT D DEFICIENCY, FRACTURES): Vit D, 25-Hydroxy: 83 ng/mL (ref 30.0–100.0)

## 2023-09-24 ENCOUNTER — Ambulatory Visit: Payer: Self-pay | Admitting: Internal Medicine

## 2023-12-23 ENCOUNTER — Other Ambulatory Visit: Payer: Self-pay | Admitting: Internal Medicine

## 2024-03-20 ENCOUNTER — Other Ambulatory Visit: Payer: Self-pay | Admitting: Internal Medicine
